# Patient Record
Sex: Female | Born: 1955 | Race: White | Hispanic: No | Smoking: Never smoker
Health system: Southern US, Community
[De-identification: ages and names within clinical notes are randomized; demographics above are authoritative.]

## PROBLEM LIST (undated history)

## (undated) DIAGNOSIS — I10 Essential (primary) hypertension: Secondary | ICD-10-CM

## (undated) DIAGNOSIS — R2689 Other abnormalities of gait and mobility: Secondary | ICD-10-CM

## (undated) DIAGNOSIS — N289 Disorder of kidney and ureter, unspecified: Secondary | ICD-10-CM

---

## 2003-12-26 ENCOUNTER — Ambulatory Visit (HOSPITAL_COMMUNITY): Admission: RE | Admit: 2003-12-26 | Discharge: 2003-12-26 | Payer: Self-pay | Admitting: Family Medicine

## 2004-10-26 ENCOUNTER — Ambulatory Visit (HOSPITAL_COMMUNITY): Admission: RE | Admit: 2004-10-26 | Discharge: 2004-10-26 | Payer: Self-pay | Admitting: Gastroenterology

## 2004-10-26 ENCOUNTER — Encounter (INDEPENDENT_AMBULATORY_CARE_PROVIDER_SITE_OTHER): Payer: Self-pay | Admitting: *Deleted

## 2006-05-13 ENCOUNTER — Emergency Department (HOSPITAL_COMMUNITY): Admission: EM | Admit: 2006-05-13 | Discharge: 2006-05-13 | Payer: Self-pay | Admitting: Emergency Medicine

## 2008-09-05 ENCOUNTER — Other Ambulatory Visit: Admission: RE | Admit: 2008-09-05 | Discharge: 2008-09-05 | Payer: Self-pay | Admitting: Family Medicine

## 2011-03-29 NOTE — Op Note (Signed)
[name], [name]              ACCOUNT NO.:  [id number]   MEDICAL RECORD NO.:  [id number]          PATIENT TYPE:  AMB   LOCATION:  ENDO                         FACILITY:  [facility]   PHYSICIAN:  James L. Rolla Flatten., M.D.DATE OF BIRTH:  [birth year]   DATE OF PROCEDURE:  DATE OF DISCHARGE:                                 OPERATIVE REPORT   DATE OF PROCEDURE:  October 26, 2004.   PROCEDURE:  Colonoscopy and biopsy.   MEDICATIONS:  1.  Fentanyl 125 mcg IV.  2.  Versed 10 mg IV.   SCOPE:  Olympus pediatric colonoscope.   INDICATIONS:  A 54 year old with chronic diarrhea and abdominal pain.  The  cause has been unclear.  She has been worked up with ultrasounds of the  abdomen, etc.  My feeling was that she likely had irritable bowel, but her  age and the fact this has been an ongoing problem for her and workup has  been negative, a colonoscopy is performed.   DESCRIPTION OF PROCEDURE:  The procedure had been explained to the patient  and consent obtained.  With the patient in the left lateral decubitus  position, the Olympus scope was inserted and advanced.  Prep was excellent.  The patient had a long, tortuous colon.  We were able to reach the cecum  with multiple maneuvers.  The ileocecal valve was seen.  The scope was  withdrawn, and the cecum, ascending colon, transverse colon, splenic  flexure, descending and sigmoid colons seen well.  The mucosa was normal.  There was a good vascular pattern with no edema, no gross colitis, and  several random biopsies were taken throughout the colon to evaluate for  microscopic colitis.  The scope was withdrawn into the rectum, and the  rectum was free of lesions.  The patient tolerated the procedure well.   ASSESSMENT:  Chronic diarrhea and abdominal pain of unclear cause.   PLAN:  Will check path and see her back in our office in three to four  weeks.       JLE/MEDQ  D:  10/26/2004  T:  10/27/2004  Job:  [id number]   cc:   Rod Preed  ????, Dr.

## 2013-05-03 ENCOUNTER — Other Ambulatory Visit (HOSPITAL_COMMUNITY)
Admission: RE | Admit: 2013-05-03 | Discharge: 2013-05-03 | Disposition: A | Discharge status: Home / Self Care | Payer: BC Managed Care – PPO | Source: Ambulatory Visit | Attending: Family Medicine | Admitting: Family Medicine

## 2013-05-03 ENCOUNTER — Other Ambulatory Visit: Payer: Self-pay | Admitting: Family Medicine

## 2013-05-03 DIAGNOSIS — Z01419 Encounter for gynecological examination (general) (routine) without abnormal findings: Secondary | ICD-10-CM | POA: Insufficient documentation

## 2015-11-28 ENCOUNTER — Other Ambulatory Visit (HOSPITAL_COMMUNITY)
Admission: RE | Admit: 2015-11-28 | Discharge: 2015-11-28 | Disposition: A | Discharge status: Home / Self Care | Payer: BLUE CROSS/BLUE SHIELD | Source: Ambulatory Visit | Attending: Family Medicine | Admitting: Family Medicine

## 2015-11-28 ENCOUNTER — Other Ambulatory Visit: Payer: Self-pay | Admitting: Family Medicine

## 2015-11-28 DIAGNOSIS — Z01411 Encounter for gynecological examination (general) (routine) with abnormal findings: Secondary | ICD-10-CM | POA: Diagnosis present

## 2015-12-01 LAB — CYTOLOGY - PAP

## 2020-04-28 ENCOUNTER — Emergency Department (HOSPITAL_COMMUNITY): Payer: Medicare Other

## 2020-04-28 ENCOUNTER — Inpatient Hospital Stay (HOSPITAL_COMMUNITY): Payer: Medicare Other

## 2020-04-28 ENCOUNTER — Encounter (HOSPITAL_COMMUNITY): Payer: Self-pay

## 2020-04-28 ENCOUNTER — Inpatient Hospital Stay (HOSPITAL_COMMUNITY)
Admission: EM | Admit: 2020-04-28 | Discharge: 2020-05-11 | DRG: 474 | Disposition: E | Payer: Medicare Other | Attending: Critical Care Medicine | Admitting: Critical Care Medicine

## 2020-04-28 ENCOUNTER — Other Ambulatory Visit: Payer: Self-pay

## 2020-04-28 DIAGNOSIS — D689 Coagulation defect, unspecified: Secondary | ICD-10-CM | POA: Diagnosis present

## 2020-04-28 DIAGNOSIS — R197 Diarrhea, unspecified: Secondary | ICD-10-CM | POA: Diagnosis not present

## 2020-04-28 DIAGNOSIS — H5461 Unqualified visual loss, right eye, normal vision left eye: Secondary | ICD-10-CM | POA: Diagnosis present

## 2020-04-28 DIAGNOSIS — D631 Anemia in chronic kidney disease: Secondary | ICD-10-CM | POA: Diagnosis present

## 2020-04-28 DIAGNOSIS — Z515 Encounter for palliative care: Secondary | ICD-10-CM | POA: Diagnosis not present

## 2020-04-28 DIAGNOSIS — R4182 Altered mental status, unspecified: Secondary | ICD-10-CM | POA: Diagnosis not present

## 2020-04-28 DIAGNOSIS — Z66 Do not resuscitate: Secondary | ICD-10-CM | POA: Diagnosis not present

## 2020-04-28 DIAGNOSIS — Y9301 Activity, walking, marching and hiking: Secondary | ICD-10-CM | POA: Diagnosis present

## 2020-04-28 DIAGNOSIS — N170 Acute kidney failure with tubular necrosis: Secondary | ICD-10-CM | POA: Diagnosis present

## 2020-04-28 DIAGNOSIS — Z79899 Other long term (current) drug therapy: Secondary | ICD-10-CM | POA: Diagnosis not present

## 2020-04-28 DIAGNOSIS — W010XXA Fall on same level from slipping, tripping and stumbling without subsequent striking against object, initial encounter: Secondary | ICD-10-CM | POA: Diagnosis present

## 2020-04-28 DIAGNOSIS — M79A21 Nontraumatic compartment syndrome of right lower extremity: Secondary | ICD-10-CM | POA: Diagnosis present

## 2020-04-28 DIAGNOSIS — J9690 Respiratory failure, unspecified, unspecified whether with hypoxia or hypercapnia: Secondary | ICD-10-CM | POA: Diagnosis present

## 2020-04-28 DIAGNOSIS — D6959 Other secondary thrombocytopenia: Secondary | ICD-10-CM | POA: Diagnosis present

## 2020-04-28 DIAGNOSIS — E872 Acidosis: Secondary | ICD-10-CM | POA: Diagnosis not present

## 2020-04-28 DIAGNOSIS — I82401 Acute embolism and thrombosis of unspecified deep veins of right lower extremity: Secondary | ICD-10-CM | POA: Diagnosis present

## 2020-04-28 DIAGNOSIS — Z4659 Encounter for fitting and adjustment of other gastrointestinal appliance and device: Secondary | ICD-10-CM

## 2020-04-28 DIAGNOSIS — R609 Edema, unspecified: Secondary | ICD-10-CM

## 2020-04-28 DIAGNOSIS — R109 Unspecified abdominal pain: Secondary | ICD-10-CM

## 2020-04-28 DIAGNOSIS — R296 Repeated falls: Secondary | ICD-10-CM | POA: Diagnosis present

## 2020-04-28 DIAGNOSIS — R238 Other skin changes: Secondary | ICD-10-CM

## 2020-04-28 DIAGNOSIS — R6521 Severe sepsis with septic shock: Secondary | ICD-10-CM | POA: Diagnosis not present

## 2020-04-28 DIAGNOSIS — I129 Hypertensive chronic kidney disease with stage 1 through stage 4 chronic kidney disease, or unspecified chronic kidney disease: Secondary | ICD-10-CM | POA: Diagnosis present

## 2020-04-28 DIAGNOSIS — R230 Cyanosis: Secondary | ICD-10-CM | POA: Diagnosis not present

## 2020-04-28 DIAGNOSIS — Z7189 Other specified counseling: Secondary | ICD-10-CM | POA: Diagnosis not present

## 2020-04-28 DIAGNOSIS — H5704 Mydriasis: Secondary | ICD-10-CM | POA: Diagnosis not present

## 2020-04-28 DIAGNOSIS — I96 Gangrene, not elsewhere classified: Secondary | ICD-10-CM | POA: Diagnosis present

## 2020-04-28 DIAGNOSIS — D62 Acute posthemorrhagic anemia: Secondary | ICD-10-CM | POA: Diagnosis not present

## 2020-04-28 DIAGNOSIS — Z20822 Contact with and (suspected) exposure to covid-19: Secondary | ICD-10-CM | POA: Diagnosis present

## 2020-04-28 DIAGNOSIS — J9601 Acute respiratory failure with hypoxia: Secondary | ICD-10-CM | POA: Diagnosis not present

## 2020-04-28 DIAGNOSIS — D72829 Elevated white blood cell count, unspecified: Secondary | ICD-10-CM | POA: Diagnosis not present

## 2020-04-28 DIAGNOSIS — E8770 Fluid overload, unspecified: Secondary | ICD-10-CM | POA: Diagnosis not present

## 2020-04-28 DIAGNOSIS — G9340 Encephalopathy, unspecified: Secondary | ICD-10-CM | POA: Diagnosis not present

## 2020-04-28 DIAGNOSIS — G9341 Metabolic encephalopathy: Secondary | ICD-10-CM | POA: Diagnosis not present

## 2020-04-28 DIAGNOSIS — I7090 Unspecified atherosclerosis: Secondary | ICD-10-CM | POA: Diagnosis not present

## 2020-04-28 DIAGNOSIS — A419 Sepsis, unspecified organism: Secondary | ICD-10-CM | POA: Diagnosis not present

## 2020-04-28 DIAGNOSIS — R0989 Other specified symptoms and signs involving the circulatory and respiratory systems: Secondary | ICD-10-CM | POA: Diagnosis not present

## 2020-04-28 DIAGNOSIS — N183 Chronic kidney disease, stage 3 unspecified: Secondary | ICD-10-CM | POA: Diagnosis not present

## 2020-04-28 DIAGNOSIS — R2681 Unsteadiness on feet: Secondary | ICD-10-CM | POA: Diagnosis present

## 2020-04-28 DIAGNOSIS — J95821 Acute postprocedural respiratory failure: Secondary | ICD-10-CM | POA: Diagnosis not present

## 2020-04-28 DIAGNOSIS — R680 Hypothermia, not associated with low environmental temperature: Secondary | ICD-10-CM | POA: Diagnosis not present

## 2020-04-28 DIAGNOSIS — I9589 Other hypotension: Secondary | ICD-10-CM | POA: Diagnosis not present

## 2020-04-28 DIAGNOSIS — Z823 Family history of stroke: Secondary | ICD-10-CM

## 2020-04-28 DIAGNOSIS — N1832 Chronic kidney disease, stage 3b: Secondary | ICD-10-CM | POA: Diagnosis present

## 2020-04-28 DIAGNOSIS — E875 Hyperkalemia: Secondary | ICD-10-CM | POA: Diagnosis not present

## 2020-04-28 DIAGNOSIS — S80821A Blister (nonthermal), right lower leg, initial encounter: Secondary | ICD-10-CM | POA: Diagnosis present

## 2020-04-28 DIAGNOSIS — G2581 Restless legs syndrome: Secondary | ICD-10-CM | POA: Diagnosis present

## 2020-04-28 DIAGNOSIS — M6282 Rhabdomyolysis: Principal | ICD-10-CM | POA: Diagnosis present

## 2020-04-28 DIAGNOSIS — Z452 Encounter for adjustment and management of vascular access device: Secondary | ICD-10-CM

## 2020-04-28 DIAGNOSIS — E860 Dehydration: Secondary | ICD-10-CM | POA: Diagnosis present

## 2020-04-28 DIAGNOSIS — T796XXS Traumatic ischemia of muscle, sequela: Secondary | ICD-10-CM | POA: Diagnosis not present

## 2020-04-28 DIAGNOSIS — S80822A Blister (nonthermal), left lower leg, initial encounter: Secondary | ICD-10-CM | POA: Diagnosis present

## 2020-04-28 DIAGNOSIS — Z978 Presence of other specified devices: Secondary | ICD-10-CM

## 2020-04-28 DIAGNOSIS — I120 Hypertensive chronic kidney disease with stage 5 chronic kidney disease or end stage renal disease: Secondary | ICD-10-CM | POA: Diagnosis not present

## 2020-04-28 DIAGNOSIS — T79A21S Traumatic compartment syndrome of right lower extremity, sequela: Secondary | ICD-10-CM | POA: Diagnosis not present

## 2020-04-28 DIAGNOSIS — Z794 Long term (current) use of insulin: Secondary | ICD-10-CM

## 2020-04-28 DIAGNOSIS — N179 Acute kidney failure, unspecified: Secondary | ICD-10-CM | POA: Diagnosis not present

## 2020-04-28 DIAGNOSIS — M79A22 Nontraumatic compartment syndrome of left lower extremity: Secondary | ICD-10-CM | POA: Diagnosis present

## 2020-04-28 DIAGNOSIS — R579 Shock, unspecified: Secondary | ICD-10-CM | POA: Diagnosis not present

## 2020-04-28 DIAGNOSIS — I4891 Unspecified atrial fibrillation: Secondary | ICD-10-CM | POA: Diagnosis not present

## 2020-04-28 DIAGNOSIS — R34 Anuria and oliguria: Secondary | ICD-10-CM | POA: Diagnosis present

## 2020-04-28 DIAGNOSIS — Z888 Allergy status to other drugs, medicaments and biological substances status: Secondary | ICD-10-CM

## 2020-04-28 DIAGNOSIS — Z992 Dependence on renal dialysis: Secondary | ICD-10-CM

## 2020-04-28 DIAGNOSIS — T79A0XA Compartment syndrome, unspecified, initial encounter: Secondary | ICD-10-CM

## 2020-04-28 DIAGNOSIS — E669 Obesity, unspecified: Secondary | ICD-10-CM | POA: Diagnosis present

## 2020-04-28 DIAGNOSIS — K7200 Acute and subacute hepatic failure without coma: Secondary | ICD-10-CM | POA: Diagnosis not present

## 2020-04-28 DIAGNOSIS — W19XXXA Unspecified fall, initial encounter: Secondary | ICD-10-CM

## 2020-04-28 DIAGNOSIS — R578 Other shock: Secondary | ICD-10-CM | POA: Diagnosis present

## 2020-04-28 DIAGNOSIS — D696 Thrombocytopenia, unspecified: Secondary | ICD-10-CM | POA: Diagnosis not present

## 2020-04-28 DIAGNOSIS — Z6837 Body mass index (BMI) 37.0-37.9, adult: Secondary | ICD-10-CM

## 2020-04-28 DIAGNOSIS — R0902 Hypoxemia: Secondary | ICD-10-CM

## 2020-04-28 DIAGNOSIS — T79A21A Traumatic compartment syndrome of right lower extremity, initial encounter: Secondary | ICD-10-CM | POA: Diagnosis present

## 2020-04-28 DIAGNOSIS — I70229 Atherosclerosis of native arteries of extremities with rest pain, unspecified extremity: Secondary | ICD-10-CM | POA: Diagnosis present

## 2020-04-28 DIAGNOSIS — Z9911 Dependence on respirator [ventilator] status: Secondary | ICD-10-CM | POA: Diagnosis not present

## 2020-04-28 DIAGNOSIS — R531 Weakness: Secondary | ICD-10-CM | POA: Diagnosis not present

## 2020-04-28 DIAGNOSIS — T796XXD Traumatic ischemia of muscle, subsequent encounter: Secondary | ICD-10-CM | POA: Diagnosis not present

## 2020-04-28 DIAGNOSIS — E785 Hyperlipidemia, unspecified: Secondary | ICD-10-CM | POA: Diagnosis present

## 2020-04-28 DIAGNOSIS — I999 Unspecified disorder of circulatory system: Secondary | ICD-10-CM

## 2020-04-28 DIAGNOSIS — R571 Hypovolemic shock: Secondary | ICD-10-CM | POA: Diagnosis not present

## 2020-04-28 DIAGNOSIS — T79A21D Traumatic compartment syndrome of right lower extremity, subsequent encounter: Secondary | ICD-10-CM | POA: Diagnosis not present

## 2020-04-28 HISTORY — DX: Other abnormalities of gait and mobility: R26.89

## 2020-04-28 HISTORY — DX: Disorder of kidney and ureter, unspecified: N28.9

## 2020-04-28 HISTORY — DX: Essential (primary) hypertension: I10

## 2020-04-28 LAB — RAPID URINE DRUG SCREEN, HOSP PERFORMED
Amphetamines: NOT DETECTED
Barbiturates: NOT DETECTED
Benzodiazepines: NOT DETECTED
Cocaine: NOT DETECTED
Opiates: NOT DETECTED
Tetrahydrocannabinol: NOT DETECTED

## 2020-04-28 LAB — I-STAT VENOUS BLOOD GAS, ED
Acid-base deficit: 5 mmol/L — ABNORMAL HIGH (ref 0.0–2.0)
Bicarbonate: 22.3 mmol/L (ref 20.0–28.0)
Calcium, Ion: 0.85 mmol/L — CL (ref 1.15–1.40)
HCT: 51 % — ABNORMAL HIGH (ref 36.0–46.0)
Hemoglobin: 17.3 g/dL — ABNORMAL HIGH (ref 12.0–15.0)
O2 Saturation: 70 %
Potassium: 6.9 mmol/L (ref 3.5–5.1)
Sodium: 138 mmol/L (ref 135–145)
TCO2: 24 mmol/L (ref 22–32)
pCO2, Ven: 47.1 mmHg (ref 44.0–60.0)
pH, Ven: 7.283 (ref 7.250–7.430)
pO2, Ven: 42 mmHg (ref 32.0–45.0)

## 2020-04-28 LAB — COMPREHENSIVE METABOLIC PANEL
ALT: 155 U/L — ABNORMAL HIGH (ref 0–44)
AST: 371 U/L — ABNORMAL HIGH (ref 15–41)
Albumin: 4.2 g/dL (ref 3.5–5.0)
Alkaline Phosphatase: 74 U/L (ref 38–126)
Anion gap: 14 (ref 5–15)
BUN: 44 mg/dL — ABNORMAL HIGH (ref 8–23)
CO2: 21 mmol/L — ABNORMAL LOW (ref 22–32)
Calcium: 8 mg/dL — ABNORMAL LOW (ref 8.9–10.3)
Chloride: 105 mmol/L (ref 98–111)
Creatinine, Ser: 1.95 mg/dL — ABNORMAL HIGH (ref 0.44–1.00)
GFR calc Af Amer: 31 mL/min — ABNORMAL LOW (ref >60)
GFR calc non Af Amer: 26 mL/min — ABNORMAL LOW (ref >60)
Glucose, Bld: 159 mg/dL — ABNORMAL HIGH (ref 70–99)
Potassium: 5.6 mmol/L — ABNORMAL HIGH (ref 3.5–5.1)
Sodium: 140 mmol/L (ref 135–145)
Total Bilirubin: 1.7 mg/dL — ABNORMAL HIGH (ref 0.3–1.2)
Total Protein: 8 g/dL (ref 6.5–8.1)

## 2020-04-28 LAB — CBC WITH DIFFERENTIAL/PLATELET
Abs Immature Granulocytes: 0 10*3/uL (ref 0.00–0.07)
Basophils Absolute: 0 10*3/uL (ref 0.0–0.1)
Basophils Relative: 0 %
Eosinophils Absolute: 0 10*3/uL (ref 0.0–0.5)
Eosinophils Relative: 0 %
HCT: 48.6 % — ABNORMAL HIGH (ref 36.0–46.0)
Hemoglobin: 15.1 g/dL — ABNORMAL HIGH (ref 12.0–15.0)
Lymphocytes Relative: 1 %
Lymphs Abs: 0.3 10*3/uL — ABNORMAL LOW (ref 0.7–4.0)
MCH: 27.4 pg (ref 26.0–34.0)
MCHC: 31.1 g/dL (ref 30.0–36.0)
MCV: 88 fL (ref 80.0–100.0)
Monocytes Absolute: 1.7 10*3/uL — ABNORMAL HIGH (ref 0.1–1.0)
Monocytes Relative: 6 %
Neutro Abs: 26.1 10*3/uL — ABNORMAL HIGH (ref 1.7–7.7)
Neutrophils Relative %: 93 %
Platelets: 352 10*3/uL (ref 150–400)
RBC: 5.52 MIL/uL — ABNORMAL HIGH (ref 3.87–5.11)
RDW: 14.2 % (ref 11.5–15.5)
WBC: 28.1 10*3/uL — ABNORMAL HIGH (ref 4.0–10.5)
nRBC: 0 % (ref 0.0–0.2)
nRBC: 0 /100 WBC

## 2020-04-28 LAB — URINALYSIS, ROUTINE W REFLEX MICROSCOPIC
Bilirubin Urine: NEGATIVE
Glucose, UA: NEGATIVE mg/dL
Ketones, ur: 5 mg/dL — AB
Leukocytes,Ua: NEGATIVE
Nitrite: NEGATIVE
Protein, ur: 100 mg/dL — AB
Specific Gravity, Urine: 1.02 (ref 1.005–1.030)
pH: 5 (ref 5.0–8.0)

## 2020-04-28 LAB — C-REACTIVE PROTEIN: CRP: 7.8 mg/dL — ABNORMAL HIGH (ref <1.0)

## 2020-04-28 LAB — BASIC METABOLIC PANEL
Anion gap: 14 (ref 5–15)
BUN: 52 mg/dL — ABNORMAL HIGH (ref 8–23)
CO2: 18 mmol/L — ABNORMAL LOW (ref 22–32)
Calcium: 6.8 mg/dL — ABNORMAL LOW (ref 8.9–10.3)
Chloride: 107 mmol/L (ref 98–111)
Creatinine, Ser: 2.44 mg/dL — ABNORMAL HIGH (ref 0.44–1.00)
GFR calc Af Amer: 23 mL/min — ABNORMAL LOW (ref >60)
GFR calc non Af Amer: 20 mL/min — ABNORMAL LOW (ref >60)
Glucose, Bld: 149 mg/dL — ABNORMAL HIGH (ref 70–99)
Potassium: 7.2 mmol/L (ref 3.5–5.1)
Sodium: 139 mmol/L (ref 135–145)

## 2020-04-28 LAB — SEDIMENTATION RATE: Sed Rate: 15 mm/hr (ref 0–22)

## 2020-04-28 LAB — CK
Total CK: 49279 U/L — ABNORMAL HIGH (ref 38–234)
Total CK: >50000 U/L — ABNORMAL HIGH (ref 38–234)

## 2020-04-28 LAB — LACTIC ACID, PLASMA
Lactic Acid, Venous: 2.1 mmol/L (ref 0.5–1.9)
Lactic Acid, Venous: 3.1 mmol/L (ref 0.5–1.9)

## 2020-04-28 LAB — SARS CORONAVIRUS 2 BY RT PCR (HOSPITAL ORDER, PERFORMED IN ~~LOC~~ HOSPITAL LAB): SARS Coronavirus 2: NEGATIVE

## 2020-04-28 LAB — BRAIN NATRIURETIC PEPTIDE: B Natriuretic Peptide: 80.7 pg/mL (ref 0.0–100.0)

## 2020-04-28 LAB — CBG MONITORING, ED: Glucose-Capillary: 161 mg/dL — ABNORMAL HIGH (ref 70–99)

## 2020-04-28 LAB — LIPASE, BLOOD: Lipase: 29 U/L (ref 11–51)

## 2020-04-28 LAB — AMMONIA: Ammonia: 22 umol/L (ref 9–35)

## 2020-04-28 LAB — ETHANOL: Alcohol, Ethyl (B): <10 mg/dL (ref <10)

## 2020-04-28 MED ORDER — INSULIN ASPART 100 UNIT/ML ~~LOC~~ SOLN
10.0000 [IU] | Freq: Once | SUBCUTANEOUS | Status: AC
  Administered 2020-04-28: 10 [IU] via INTRAVENOUS

## 2020-04-28 MED ORDER — ALBUTEROL SULFATE (2.5 MG/3ML) 0.083% IN NEBU
10.0000 mg | INHALATION_SOLUTION | Freq: Once | RESPIRATORY_TRACT | Status: AC
  Administered 2020-04-28: 10 mg via RESPIRATORY_TRACT
  Filled 2020-04-28: qty 12

## 2020-04-28 MED ORDER — SODIUM CHLORIDE 0.9 % IV SOLN: Freq: Once | INTRAVENOUS | Status: DC

## 2020-04-28 MED ORDER — ACETAMINOPHEN 325 MG PO TABS: 650.0000 mg | ORAL_TABLET | Freq: Four times a day (QID) | ORAL | Status: DC | PRN

## 2020-04-28 MED ORDER — LACTATED RINGERS IV SOLN: INTRAVENOUS | Status: AC

## 2020-04-28 MED ORDER — ENOXAPARIN SODIUM 30 MG/0.3ML ~~LOC~~ SOLN
30.0000 mg | SUBCUTANEOUS | Status: DC
  Administered 2020-04-28 – 2020-04-29 (×2): 30 mg via SUBCUTANEOUS
  Filled 2020-04-28 (×2): qty 0.3

## 2020-04-28 MED ORDER — INSULIN ASPART 100 UNIT/ML ~~LOC~~ SOLN: 10.0000 [IU] | Freq: Once | SUBCUTANEOUS | Status: DC

## 2020-04-28 MED ORDER — INSULIN ASPART 100 UNIT/ML ~~LOC~~ SOLN
0.0000 [IU] | Freq: Three times a day (TID) | SUBCUTANEOUS | Status: DC
  Administered 2020-04-29 (×2): 2 [IU] via SUBCUTANEOUS

## 2020-04-28 MED ORDER — DEXTROSE 50 % IV SOLN
50.0000 mL | Freq: Once | INTRAVENOUS | Status: AC
  Administered 2020-04-28: 50 mL via INTRAVENOUS
  Filled 2020-04-28: qty 50

## 2020-04-28 MED ORDER — SODIUM ZIRCONIUM CYCLOSILICATE 5 G PO PACK
5.0000 g | PACK | Freq: Two times a day (BID) | ORAL | Status: DC
  Administered 2020-04-28 – 2020-04-29 (×2): 5 g via ORAL
  Filled 2020-04-28 (×4): qty 1

## 2020-04-28 MED ORDER — SENNOSIDES-DOCUSATE SODIUM 8.6-50 MG PO TABS: 1.0000 | ORAL_TABLET | Freq: Every evening | ORAL | Status: DC | PRN

## 2020-04-28 MED ORDER — SODIUM CHLORIDE 0.9 % IV BOLUS
500.0000 mL | Freq: Once | INTRAVENOUS | Status: AC
  Administered 2020-04-28: 500 mL via INTRAVENOUS

## 2020-04-28 MED ORDER — CALCIUM GLUCONATE-NACL 1-0.675 GM/50ML-% IV SOLN
1.0000 g | Freq: Once | INTRAVENOUS | Status: AC
  Administered 2020-04-28: 1000 mg via INTRAVENOUS
  Filled 2020-04-28: qty 50

## 2020-04-28 MED ORDER — ASPIRIN 325 MG PO TABS
325.0000 mg | ORAL_TABLET | Freq: Every day | ORAL | Status: DC
  Administered 2020-04-28: 325 mg via ORAL
  Filled 2020-04-28: qty 1

## 2020-04-28 MED ORDER — SODIUM CHLORIDE 0.9 % IV BOLUS
1000.0000 mL | Freq: Once | INTRAVENOUS | Status: AC
  Administered 2020-04-28: 1000 mL via INTRAVENOUS

## 2020-04-28 MED ORDER — LACTATED RINGERS IV BOLUS
1000.0000 mL | Freq: Once | INTRAVENOUS | Status: AC
  Administered 2020-04-28: 1000 mL via INTRAVENOUS

## 2020-04-28 MED ORDER — ACETAMINOPHEN 650 MG RE SUPP: 650.0000 mg | Freq: Four times a day (QID) | RECTAL | Status: DC | PRN

## 2020-04-28 NOTE — ED Notes (Signed)
Pt continues to sleep, intermittently wakes with speech and touch.

## 2020-04-28 NOTE — ED Notes (Signed)
Pt returns from xray with reports of difficulty getting pt in good positions for films.  PA notified.

## 2020-04-28 NOTE — H&P (Signed)
Date: 04/27/2020               Patient Name:  [name] MRN: [id number]  DOB: 27-Oct-1956 Age / Sex: 64 y.o., female   PCP: Patient, No Pcp Per         Medical Service: Internal Medicine Teaching Service         Attending Physician: Dr. Charlesetta Shanks, MD    First Contact: Dr. Harvie Heck Pager: [phone]  Second Contact: Dr. Gilberto Better Pager: [phone]       After Hours (After 5p/  First Contact Pager: [phone]  weekends / holidays): Second Contact Pager: [phone]   Chief Complaint: Fall  History of Present Illness:   [name] is a 64 yo F w/ PMH of HTN, Restless leg presenting to [facility] w/ complaint of fall. She was in her usual state of health until last night when she fell out of her bed while sleeping. She states she was woken up by the fall and called out to her family members who were able to get her into her bed. Later in the night, she fell again but this time her family members were not able to get her off the floor for ~3-4 hours. She denies any trauma to the head. She is unable to recall the exact nature of her fall as she was sleeping at the time. Eventually they found her down and brought her to the hospital. She mentions that after prolonged stay on the floor, she began to have worsening pain 'all over' but especially of her lower extremities, which is exacerbated by ambulation. She denies any headache, blurry vision, nausea, vomiting. She mentions feeling very cold and weak.  Attempt was made to contact family for further history but phone number did not pick up.  In the ED, she was found to have CK of 50,000, with AKI and hyperkalemia. IMTS was consulted for admission.   Meds: Current Meds  Medication Sig  . amLODipine (NORVASC) 10 MG tablet Take 10 mg by mouth daily.  Marland Kitchen gabapentin (NEURONTIN) 100 MG capsule Take 100-200 mg by mouth See admin instructions. Take 200 mg by mouth midday and 100 mg two hours before bedtime   Allergies: Allergies as of  04/16/2020 - Review Complete 05/10/2020  Allergen Reaction Noted  . Olmesartan Other (See Comments) 11/12/2018  . Requip [ropinirole] Other (See Comments) 04/16/2020  . Valsartan Other (See Comments) 11/12/2018   Past Medical History:  Diagnosis Date  . Diabetes mellitus without complication (HCC)    Borderline  . Renal disorder    Chronic kidney disease   Family History:  Mother had stroke, Father had CABG in 31s. Brother has a pacemaker  Social History: Denies any alcohol, tobacco, illicit substance use. Able to perform ADLs, and IADLs independently at baseline. Lives with daughter and her family.  Review of Systems: A complete ROS was negative except as per HPI.  Physical Exam: Blood pressure 134/90, pulse 83, temperature 98.5 F (36.9 C), temperature source Oral, resp. rate (!) 28, height 5' 2"  (1.575 m), weight 81.6 kg, SpO2 100 %.  Gen: Well-developed, ill-appearing HEENT: NCAT head, hearing intact, Right artificial eye, No nasal discharge CV: RRR, S1, S2 normal, No rubs, no murmurs Pulm: CTAB, No rales, no wheezes Abd: Soft, BS+, NTND, No rebound, no guarding  Extm: ROM limited by pain, Peripheral pulses intact, no pitting edema Skin: Dry, Warm, multiple Clear filled blisters bilateral lower extremities Neurologic exam: Mental status: A&Ox3 Cranial Nerves:  II: PERRL             III, IV, VI: Extra-occular motions intact on left             V, VII: Face symmetric, sensation intact in all 3 divisions               VIII: hearing normal to rubbing fingers bilaterally               IX, X: palate rises symmetrically             XI: Head turn and shoulder shrug normal bilaterally               XII: tongue midline    Motor: Strength 5/5 on all bilateral upper extremities, Strength 0/5 on bilateral lower extremities, bulk muscle and tone are normal  Sensory: Light touch intact and symmetric diminished bilateral lower extremities Coordination: Unable to  assess Psychiatric: Normal mood and affect      EKG: N/A  CXR: personally reviewed my interpretation is rotated film, poor inspiratory effort, no lobar consolidation, no pleural effusion, no pulmonary edema  Assessment & Plan by Problem: Active Problems:   * No active hospital problems. *  [name] is a 64 yo F w/ PMH of Obesity, HTN, HLD, Pre-diabetes, CKD3 and Restless legs admit for rhabdomyolysis  Rhabdomyolysis Hyperkalemia Acute Kidney Injury on Chronic Kidney Disease Stage 3a Found down after 3-4 hours. Admit CK 49,279. K 5.6, BUN 44, Creatinine 1.95. Baseline bun / creatinine (34, 1.35) w/ GFR 42. UA + for hgb w/o RBCs. Started on fluids in ED. I-stat K (6.9) elevated but likely hemolyzed. - LR bolus + 200cc/hr  - Trend CK, Bmp - Stat EKG - Monitor electrolytes - Lokelma - Telemetry  Lower extremity weakness 2/2 CVA vs deconditioning vs myolysis On exam, profound lower extremity weakness with inability to tolerate movement. Unclear if purely due to myolysis vs neurological event. CT head negative. ESR 15, CRP 7.8. Chart review shows prior work-up w/ outpatient neuro w/ negative MR brain, thought to be due to vertigo treated w/ PT. Profound weakness with numbness concerning for CVA as she has risk factors. 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 States no prior hx. Had prolonged time down after fall. Unclear etiology but visual exam consistent w/ bullous pemphigoid - Monitor - F/u outpatient  HTN On amlodipine 151mat home. Admit bp 134/90 - C/w home meds  DVT prophx: Lovenox Diet: NPO Bowel: Senokot Code: Full  Prior to Admission Living Arrangement: Home Anticipated Discharge Location: SNF Barriers to Discharge: Medical treatment Dispo: Anticipated discharge in approximately 3-4 day(s).   Dispo: Admit patient  to Inpatient with expected length of stay greater than 2 midnights.  Signed: LeMosetta AnisMD 05/08/2020, 7:01 PM Pager: [phone] 5pm on weekdays and 1pm on weekends: On Call Pager: [phone]

## 2020-04-28 NOTE — ED Notes (Signed)
Pt has confusion regarding events, when logrolled c/o pain all over.  Unable to move her legs (I) to position pure wick.  Tells PA that she had fall 2 nights ago.  She is unsure why she came to ED today, perhaps that she wasn't getting any better.   To Xray.

## 2020-04-28 NOTE — ED Notes (Signed)
Remains alert and oriented x 4, denies any pain in head, neck or chest.  No visual changes.

## 2020-04-28 NOTE — ED Notes (Signed)
Unsuccessful sticks at the right ac and hand. Only stuck patient twice.

## 2020-04-28 NOTE — ED Notes (Signed)
Pure wick in place, yet remains unable to give sample at this time.  No other needs noted.

## 2020-04-28 NOTE — ED Provider Notes (Signed)
Medical screening examination/treatment/procedure(s) were conducted as a shared visit with non-physician practitioner(s) and myself.  I personally evaluated the patient during the encounter.    Vent. rate 88 BPM PR interval * ms QRS duration 98 ms QT/QTc 375/454 ms P-R-T axes * 15 35 No acute ischemic change.  Artifact.  No atrial fibrillation.  Patient gives history.  She reports she fell out of bed 2 nights ago and woke up on the floor.  She was helped up but then the following morning lost her balance and fell again.  She reports at that time she spent about 2 to 3 hours until her daughter and grandson came back and found her.  She reports she is having a lot of general pain and pain in her legs.  She is also developed large blisters on her thighs bilaterally.  Patient is alert and answering questions.  Content of speech is appropriate.  She appears mildly delayed in responses but cognitive function is intact.  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  Mucous membranes slightly dry.  Heart regular.  No respiratory distress.  Lungs grossly clear.  Abdomen soft without guarding.  Patient has significant edema bilateral lower extremities 2+.  Large bullae of the upper legs bilaterally, the medial aspect.  No surrounding erythema.  Patient presents with falls and confusion noted by family.  She also has large atypical bullae that do not seem to correspond with pressure injury.  Extreme elevation in total CK.  CT head is not showing acute injury pattern.  Will initiate fluid resuscitation, I agree with plan and management.    Charlesetta Shanks, MD 04/17/2020 [phone]

## 2020-04-28 NOTE — ED Notes (Signed)
Dia Crawford (Daughter# [phone]) called.

## 2020-04-28 NOTE — ED Notes (Signed)
Pt cuff adjusted, and pt returns to sleep quickly.

## 2020-04-28 NOTE — ED Notes (Signed)
Patient transported to MRI 

## 2020-04-28 NOTE — ED Provider Notes (Signed)
[facility] EMERGENCY DEPARTMENT Provider Note   CSN: [id number] Arrival date & time: 04/18/2020  1517     History No chief complaint on file.   [name] is a 64 y.o. female with history of renal insufficiency, RLS, unsteady gait brought to the ER from home by EMS for evaluation of possible falls. History initially obtained directly from patient. She is able to tell me her full name, where she is. States that 2 nights ago she was asleep and rolled off her bed, woke up by the impact on the ground. Tells me she was helped up by her daughter and son-in-law. The following morning she was walking and she lost her balance and fell onto her side against a TV. States when she fell down to the ground her legs were twisted in an awkward position. She thinks she was on the ground in that position for 2 to 3 h until her daughter and son-in-law picked her back up. Woke up this morning and she noticed bilateral leg pain that is worse with any movement. She thinks it is because of the position she was in after her second fall. States she can usually walk without any assistance or assistive devices but cannot actually stand still because she starts feeling like she is swaying backwards and falls. States her primary care doctor has been evaluating her for this and are currently trying to figure out why she has a difficult time standing still. Patient complaining of pain "all over". Reports chronic left shoulder injury and pain. As were rolling patient she screams out in pain stating that everything hurts. During exam I noticed several blisters on bilateral legs, abrasions. When asked about these blisters patient states that she thinks they're from her falls and her legs being stuck in 1 position for long time. She denies headache. She denies vision changes. Denies any chest pain, shortness of breath. Denies any urinary symptoms. No recent vomiting or diarrhea. No cough. States yesterday she felt  okay and went grocery shopping for her mother. Patient seems to be slow to respond and will need to obtain further information from family member.  No interventions. No modifying factors. No associated symptoms.  HPI     Past Medical History:  Diagnosis Date  . Diabetes mellitus without complication (HCC)    Borderline  . Renal disorder    Chronic kidney disease    There are no problems to display for this patient.   ** The histories are not reviewed yet. Please review them in the "History" navigator section and refresh this [place].   OB History   No obstetric history on file.     No family history on file.  Social History   Tobacco Use  . Smoking status: Never Smoker  . Smokeless tobacco: Never Used  Substance Use Topics  . Alcohol use: Not Currently  . Drug use: Never    Home Medications Prior to Admission medications   Medication Sig Start Date End Date Taking? Authorizing Provider  amLODipine (NORVASC) 10 MG tablet Take 10 mg by mouth daily. 04/01/20  Yes [provider]  gabapentin (NEURONTIN) 100 MG capsule Take 100-200 mg by mouth See admin instructions. Take 200 mg by mouth midday and 100 mg two hours before bedtime 04/24/20  Yes [provider]  rOPINIRole (REQUIP) 2 MG tablet Take by mouth. Patient not taking: Reported on 04/24/2020 03/29/20   [provider]    Allergies    Olmesartan, Requip [ropinirole],  and Valsartan  Review of Systems   Review of Systems  Musculoskeletal: Positive for myalgias (bilateral legs).  Skin:       Blisters   All other systems reviewed and are negative.   Physical Exam Updated Vital Signs BP 134/90   Pulse 83   Temp 98.5 F (36.9 C) (Oral)   Resp (!) 28   Ht 5\' 2"  (1.575 m)   Wt 81.6 kg   SpO2 100%   BMI 32.92 kg/m   Physical Exam Vitals and nursing note reviewed.  Constitutional:      Appearance: She is well-developed.     Comments: Appears older than stated age.   HENT:      Head: Normocephalic and atraumatic.     Comments: No obvious signs of facial or scalp injury.    Nose: Nose normal.     Mouth/Throat:     Comments: Dry lips. Normal mucous membranes, moist. Eyes:     Conjunctiva/sclera: Conjunctivae normal.     Comments: Right eyelid/eyelashes have dried crusting discharge. Patient states this is not new and has chronic right eye blindness. Left eyelid, eye exam normal.  Neck:     Comments: No cervical collar in place. No midline or paraspinal cervical tenderness. Cardiovascular:     Rate and Rhythm: Normal rate and regular rhythm.     Comments: Symmetric 2+ edema from knees to midfoot bilaterally. No calf tenderness. Several skin colored blisters throughout legs bilaterally  Pulmonary:     Effort: Pulmonary effort is normal.     Breath sounds: Normal breath sounds.  Abdominal:     General: Bowel sounds are normal.     Palpations: Abdomen is soft.     Tenderness: There is no abdominal tenderness.     Comments: ?obese vs distended. No abdominal tenderness.  No RUQ/epigastric tenderness   Musculoskeletal:        General: Normal range of motion.     Cervical back: Normal range of motion.     Comments: TL spine: No midline and paraspinal muscle tenderness. No contusions or signs of injury to the back.  Pelvis: Patient holds the left leg/hip flexed. Pain in left groin/upper thigh with active ROM of left leg. Pain in right upper thigh with active ROM of right leg. Abrasion of the left lateral hip and upper thigh, mildly tender.   LUE: no focal bony tenderness of left shoulder, elbow, wrist but patient reports pain with movement on arm in the humerus.   Compartments of upper/lower legs bilaterally tender, slightly firm.   Skin:    General: Skin is warm and dry.     Capillary Refill: Capillary refill takes less than 2 seconds.     Findings: Erythema and lesion present.     Comments: Several small to large skin colored blisters throughout medial thighs  bilaterally, anterior legs. Some have ruptured spontaneously. Several linear abrasions in the left lateral thigh, left lateral abdomen. Nontender erythematous glossy patch posterior knee (left).  Neurological:     Mental Status: She is alert.     Sensory: Sensory deficit present.     Comments:  Patient cannot feel light touch on RIGHT foot, normal sensation in the rest of the leg.  This is inconsistent in serial exams, previously, patient could not feel from knee > foot.  Patient cannot move RLE at hip, knee or ankle level or wiggles toes. Reports pain in right upper thigh with passive ROM of RLE. Weak but symmetric bilateral hand grips.  No  arm drift, patient cannot hold palms up for pronator drift due to bilateral weakness. Answers are slightly delayed but speech is clear. Patient appears somnolent but arouses to voice and can hold coherent conversation.  Cranial nerves I, II not tested. Cranial nerves III to XII normal. Unable to test gait.  Psychiatric:        Behavior: Behavior normal.     ED Results / Procedures / Treatments   Labs (all labs ordered are listed, but only abnormal results are displayed) Labs Reviewed  CBC WITH DIFFERENTIAL/PLATELET - Abnormal; Notable for the following components:      Result Value   WBC 28.1 (*)    RBC 5.52 (*)    Hemoglobin 15.1 (*)    HCT 48.6 (*)    Neutro Abs 26.1 (*)    Lymphs Abs 0.3 (*)    Monocytes Absolute 1.7 (*)    All other components within normal limits  COMPREHENSIVE METABOLIC PANEL - Abnormal; Notable for the following components:   Potassium 5.6 (*)    CO2 21 (*)    Glucose, Bld 159 (*)    BUN 44 (*)    Creatinine, Ser 1.95 (*)    Calcium 8.0 (*)    AST 371 (*)    ALT 155 (*)    Total Bilirubin 1.7 (*)    GFR calc non Af Amer 26 (*)    GFR calc Af Amer 31 (*)    All other components within normal limits  URINALYSIS, ROUTINE W REFLEX MICROSCOPIC - Abnormal; Notable for the following components:   APPearance HAZY (*)     Hgb urine dipstick LARGE (*)    Ketones, ur 5 (*)    Protein, ur 100 (*)    Bacteria, UA RARE (*)    All other components within normal limits  CK - Abnormal; Notable for the following components:   Total CK 49,279 (*)    All other components within normal limits  LACTIC ACID, PLASMA - Abnormal; Notable for the following components:   Lactic Acid, Venous 2.1 (*)    All other components within normal limits  I-STAT VENOUS BLOOD GAS, ED - Abnormal; Notable for the following components:   Acid-base deficit 5.0 (*)    Potassium 6.9 (*)    Calcium, Ion 0.85 (*)    HCT 51.0 (*)    Hemoglobin 17.3 (*)    All other components within normal limits  CULTURE, BLOOD (ROUTINE X 2)  CULTURE, BLOOD (ROUTINE X 2)  LIPASE, BLOOD  ETHANOL  AMMONIA  RAPID URINE DRUG SCREEN, HOSP PERFORMED  LACTIC ACID, PLASMA  SEDIMENTATION RATE  C-REACTIVE PROTEIN  BRAIN NATRIURETIC PEPTIDE    EKG None  Radiology DG Chest 1 View  Result Date: 04/20/2020 CLINICAL DATA:  Recent fall with chest pain, initial encounter EXAM: CHEST  1 VIEW COMPARISON:  None. FINDINGS: Cardiac shadow is at the upper limits of normal in size. The overall inspiratory effort is poor. No infiltrate or effusion is seen. No pneumothorax is noted. The visualized bony structures are unremarkable. IMPRESSION: No acute abnormality noted. Electronically Signed   By: Inez Catalina M.D.   On: 04/20/2020 12:34   DG Lumbar Spine Complete  Result Date: 04/27/2020 CLINICAL DATA:  Fall with pain EXAM: LUMBAR SPINE - COMPLETE 4+ VIEW COMPARISON:  None. FINDINGS: Lumbar alignment is within normal limits. Vertebral body heights are maintained. Diffuse degenerative changes with diffuse mild disc space narrowing throughout the lumbar spine and small osteophytes. More bulky anterior osteophyte  at L1-L2. Mild facet degenerative change of the lower lumbar spine. IMPRESSION: No acute osseous abnormality. Electronically Signed   By: Donavan Foil M.D.   On:  04/29/2020 17:57   DG Shoulder Right  Result Date: 05/07/2020 CLINICAL DATA:  Recent fall with right shoulder pain, initial encounter EXAM: RIGHT SHOULDER - 2+ VIEW COMPARISON:  None. FINDINGS: Mild degenerative changes of the acromioclavicular joint are seen. No acute fracture or dislocation is noted. The underlying bony thorax is within normal limits. IMPRESSION: Degenerative change without acute abnormality. Electronically Signed   By: Inez Catalina M.D.   On: 05/03/2020 12:33   DG Elbow Complete Left  Result Date: 05/05/2020 CLINICAL DATA:  Fall with upper extremity pain EXAM: LEFT ELBOW - COMPLETE 3+ VIEW COMPARISON:  None. FINDINGS: There is no evidence of fracture, dislocation, or joint effusion. There is no evidence of arthropathy or other focal bone abnormality. Soft tissues are unremarkable. IMPRESSION: Negative. Electronically Signed   By: Donavan Foil M.D.   On: 05/10/2020 17:55   CT Head Wo Contrast  Result Date: 04/20/2020 CLINICAL DATA:  Fall.  Patient denies head and neck pain. EXAM: CT HEAD WITHOUT CONTRAST CT CERVICAL SPINE WITHOUT CONTRAST TECHNIQUE: Multidetector CT imaging of the head and cervical spine was performed following the standard protocol without intravenous contrast. Multiplanar CT image reconstructions of the cervical spine were also generated. COMPARISON:  None. FINDINGS: CT HEAD FINDINGS Brain: No subdural, epidural, or subarachnoid hemorrhage. Ventricles and sulci are unremarkable. Cerebellum, brainstem, and basal cisterns are patent. No acute cortical ischemia or infarct. No mass effect or midline shift. Vascular: No hyperdense vessel or unexpected calcification. Skull: Normal. Negative for fracture or focal lesion. Sinuses/Orbits: No acute finding. Other: Left globe is intact. 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 Recommend clinical correlation. No other extracranial soft tissue abnormalities. CT CERVICAL SPINE FINDINGS Alignment: Reversal of normal  lordosis centered at C5-6. No traumatic malalignment identified. Skull base and vertebrae: No acute fracture. No primary bone lesion or focal pathologic process. Soft tissues and spinal canal: Apparent 1.6 cm nodule in the posterior right thyroid lobe on series 1, image 59. Disc levels:  Multilevel degenerative changes. Upper chest: Negative. Other: No other abnormalities. IMPRESSION: 1. No acute intracranial abnormalities identified. 2. 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 Recommend clinical correlation. 3. No fracture or traumatic malalignment in the cervical spine. 4. 1.6 cm nodule in the right thyroid lobe. Recommend thyroid US (ref: J Am Coll Radiol. 2015 Feb;12(2): 143-50). Electronically Signed   By: Dorise Bullion III M.D   On: 05/08/2020 14:24   CT Cervical Spine Wo Contrast  Result Date: 04/18/2020 CLINICAL DATA:  Fall.  Patient denies head and neck pain. EXAM: CT HEAD WITHOUT CONTRAST CT CERVICAL SPINE WITHOUT CONTRAST TECHNIQUE: Multidetector CT imaging of the head and cervical spine was performed following the standard protocol without intravenous contrast. Multiplanar CT image reconstructions of the cervical spine were also generated. COMPARISON:  None. FINDINGS: CT HEAD FINDINGS Brain: No subdural, epidural, or subarachnoid hemorrhage. Ventricles and sulci are unremarkable. Cerebellum, brainstem, and basal cisterns are patent. No acute cortical ischemia or infarct. No mass effect or midline shift. Vascular: No hyperdense vessel or unexpected calcification. Skull: Normal. Negative for fracture or focal lesion. Sinuses/Orbits: No acute finding. Other: Left globe is intact. 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 Recommend clinical correlation. No other extracranial soft tissue abnormalities. CT CERVICAL SPINE FINDINGS Alignment: Reversal of normal lordosis centered at C5-6.  No traumatic malalignment identified. Skull base and vertebrae: No acute fracture. No primary  bone lesion or focal pathologic process. Soft tissues and spinal canal: Apparent 1.6 cm nodule in the posterior right thyroid lobe on series 1, image 59. Disc levels:  Multilevel degenerative changes. Upper chest: Negative. Other: No other abnormalities. IMPRESSION: 1. No acute intracranial abnormalities identified. 2. 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 Recommend clinical correlation. 3. No fracture or traumatic malalignment in the cervical spine. 4. 1.6 cm nodule in the right thyroid lobe. Recommend thyroid US (ref: J Am Coll Radiol. 2015 Feb;12(2): 143-50). Electronically Signed   By: Dorise Bullion III M.D   On: 04/15/2020 14:24   DG Shoulder Left  Result Date: 04/29/2020 CLINICAL DATA:  Recent fall with left shoulder pain, initial encounter EXAM: LEFT SHOULDER - 2+ VIEW COMPARISON:  None. FINDINGS: Degenerative changes of the acromioclavicular joint are seen. No acute fracture or dislocation is noted. The underlying bony thorax is within normal limits. No soft tissue abnormality is seen. IMPRESSION: No acute abnormality noted. Electronically Signed   By: Inez Catalina M.D.   On: 04/16/2020 12:29   DG Humerus Left  Result Date: 04/22/2020 CLINICAL DATA:  Altered left upper extremity pain fall EXAM: LEFT HUMERUS - 2+ VIEW COMPARISON:  04/29/2020 radiograph left shoulder FINDINGS: There is no evidence of fracture or other focal bone lesions. Soft tissues are unremarkable. IMPRESSION: Negative. Electronically Signed   By: Donavan Foil M.D.   On: 04/24/2020 17:55   DG Hip Unilat W or Wo Pelvis 2-3 Views Left  Result Date: 05/05/2020 CLINICAL DATA:  Recent fall with left hip pain, initial encounter EXAM: DG HIP (WITH OR WITHOUT PELVIS) 3V LEFT COMPARISON:  None. FINDINGS: Pelvic ring is intact. Mild degenerative changes of the hip joints are seen. No acute fracture is noted. No soft tissue abnormality is seen. IMPRESSION: No acute abnormality noted. Electronically Signed   By: Inez Catalina M.D.   On: 05/02/2020 12:34   DG FEMUR, MIN 2 VIEWS RIGHT  Result Date: 04/18/2020 CLINICAL DATA:  Right femur pain after fall EXAM: RIGHT FEMUR 2 VIEWS COMPARISON:  04/20/2020 hip radiograph FINDINGS: There is no evidence of fracture or other focal bone lesions. Soft tissues are unremarkable. IMPRESSION: Negative. Electronically Signed   By: Donavan Foil M.D.   On: 05/03/2020 17:58    Procedures .Critical Care Performed by: Kinnie Feil, PA-C Authorized by: Kinnie Feil, PA-C   Critical care provider statement:    Critical care time (minutes):  45   Critical care was necessary to treat or prevent imminent or life-threatening deterioration of the following conditions: rhabdo.   Critical care was time spent personally by me on the following activities:  Discussions with consultants, evaluation of patient's response to treatment, examination of patient, ordering and performing treatments and interventions, ordering and review of laboratory studies, ordering and review of radiographic studies, pulse oximetry, re-evaluation of patient's condition, obtaining history from patient or surrogate and review of old charts   (including critical care time)  Medications Ordered in ED Medications  0.9 %  sodium chloride infusion (has no administration in time range)  sodium chloride 0.9 % bolus 500 mL (500 mLs Intravenous New Bag/Given 05/09/2020 1741)    ED Course  I have reviewed the triage vital signs and the nursing notes.  Pertinent labs & imaging results that were available during my care of the patient were reviewed by me and considered in my medical decision  making (see chart for details).  Clinical Course as of Apr 28 1820  Fri Apr 28, 2020  1333 Potassium(!): 5.6 [CG]  1333 Creatinine(!): 1.95 [CG]  1333 BUN(!): 44 [CG]  1333 AST(!): 371 [CG]  1333 ALT(!): 155 [CG]  1333 Total Bilirubin(!): 1.7 [CG]  1333 GFR, Est Non African American(!): 26 [CG]  1345 Daughter -  Went to see a new doctor ENT did testing for vertigo, unsure if that messed her balance.  States since appointment her legs have been hurting more and walking is worse. Gabapentin and another med for RLS, stopped another medicine cold [place].  Has not slept well because legs have been hurting since.  Fall last night, seemed ok.  Grandson went to check on her and on floor again. Daughter today called EMS because she seem incoherent, sleepier.  ?hallucinations "people coming in all night".  Noticed leg swelling and blisters.  Concern for dehydration, something related to the fall.  She typically does walk by herself, indepednet, feeds bathes herself.     [CG]  1350 Daughter states she has fallen before but not often. Concern about mother able to walk.   [CG]  1456 1. No acute intracranial abnormalities identified. 2. 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 Recommend clinical correlation. 3. No fracture or traumatic malalignment in the cervical spine. 4. 1.6 cm nodule in the right thyroid lobe. Recommend thyroid US (ref: J Am Coll Radiol. 2015 Feb;12(2): 143-50).  CT Head Wo Contrast [CG]  1617 Significant delay in CK - called lab    [CG]  1618 Lab states 13 dilutions needed .Marland Kitchen.. 49,279   [CG]  1632 CK Total(!): 49,279 [CG]  1062 Pharmacy asked to reconcile meds   [CG]  1804 Lactic Acid, Venous(!!): 2.1 [CG]    Clinical Course User Index [CG] Kinnie Feil, PA-C   MDM Rules/Calculators/A&P                          Patient with chief complaint of falls.  I obtained additional history from patient's daughter who lives with her, see above.  Concern for AMS, confusion, hallucinations, inability to walk. Usually ambulatory, independent.   Previous medical records available, nursing notes reviewed to obtain more history as well and assist with MDM.  Several positive findings on exam.  Patient appears dehydrated, dry lips.  She has generalized weakness in all extremities,  tremorous in the upper extremities.  She cannot move her legs, can only wiggle her left toes.  She cannot feel her right foot but can otherwise feels the upper part of the right leg.  Movement of left upper, bilateral legs causes pain everywhere.  Appears somewhat tired but is giving coherent history.  Chief complain involves an extensive number of treatment options and is a complaint that carries with it a high risk of complications and morbidity and mortality  The differential diagnosis includes occult infection, electrolyte abnormalities, intracranial bleed.  She has no history of illicit drug use, alcohol use.  Overall physical exam is not consistent with stroke however neurological exam is unreliable given her mentation.  I have ordered laboratory studies including screening labs, CK, ammonia, lactic acid, VBG, urinalysis.  I have ordered imaging studies including head/cervical CT, x-rays based on exam findings.  Continuous cardiac monitoring, continuous pulse ox.  1648: Significant delay in obtaining lab work. Significant delay in obtaining I&O for urine.  I ordered, reviewed and personally visualized and interpreted the above  ER work-up.  ER work-up reveals leukocytosis 28, markedly elevated CK almost 50,000, elevated LFTs, creatinine 1.95.  Urine is coke colored.  I do not have other labs to determine patient's baseline but and she has documented history of CKD.  X-rays and head/cervical CT are nonacute.    Pending lactic acid, ammonia, BNP, UA.  Concern for possible myositis, CRP and sed rate added.  X-rays pending.   Patient shared with EDP.  Will plan to admit for rhabdomyolysis.  500 IV fluid bolus ordered followed by maintenance fluids.  1558: Ammonia normal.  Lactic acid 2.1.  Pending VBG.   I have re-evaluated the patient and no clinical decline, tired appearing but wakes up with conversation, no increased work of breathing.   Pending hospital admission. RN to recollect/resend  VBG.   Final Clinical Impression(s) / ED Diagnoses Final diagnoses:  Fall  AMS (altered mental status)    Rx / DC Orders ED Discharge Orders    None       Kinnie Feil, PA-C 04/16/2020 1821    Charlesetta Shanks, MD 04/26/2020 1031

## 2020-04-28 NOTE — ED Notes (Signed)
Pt has elevated BP suddenly and reportedly to PA decreased sensation in her R foot.  On exam she does report sensation intact to R foot with deep touch and light touch.  Plantar and dorsi flexion.

## 2020-04-28 NOTE — ED Triage Notes (Signed)
Hx of restless leg syndrome and recently changed to gabapentin without relief of sx.  Pt fell OOB last PM and family placed her back in bed, had second episode standing up and fell, without dizziness noted.  States she hit the tv stand and tv.  Denies striking head or LOC.  NO pain to head neck or back.  No pain to chest or abd, no SOB.  C/o pain to LE with cramping.   Pt alert and oriented, ambulates at home (I), but has unsteady gait, and balance issues with standing and still position.  Fluids filled blisters noted to LLE anterior and posterio with red streaks to L leg above the knee laterally.  Redness noted posterior on RLE, both redness appears sunburn like.  These were not noted prior to bed. Denies any incontinence.

## 2020-04-29 ENCOUNTER — Inpatient Hospital Stay (HOSPITAL_COMMUNITY): Payer: Medicare Other

## 2020-04-29 ENCOUNTER — Encounter (HOSPITAL_COMMUNITY): Payer: Self-pay | Admitting: Internal Medicine

## 2020-04-29 DIAGNOSIS — M6282 Rhabdomyolysis: Principal | ICD-10-CM

## 2020-04-29 DIAGNOSIS — E875 Hyperkalemia: Secondary | ICD-10-CM

## 2020-04-29 DIAGNOSIS — G2581 Restless legs syndrome: Secondary | ICD-10-CM

## 2020-04-29 DIAGNOSIS — R531 Weakness: Secondary | ICD-10-CM

## 2020-04-29 DIAGNOSIS — I120 Hypertensive chronic kidney disease with stage 5 chronic kidney disease or end stage renal disease: Secondary | ICD-10-CM

## 2020-04-29 DIAGNOSIS — D72829 Elevated white blood cell count, unspecified: Secondary | ICD-10-CM

## 2020-04-29 LAB — LIPID PANEL
Cholesterol: 157 mg/dL (ref 0–200)
HDL: 56 mg/dL (ref >40)
LDL Cholesterol: 84 mg/dL (ref 0–99)
Total CHOL/HDL Ratio: 2.8 RATIO
Triglycerides: 85 mg/dL (ref <150)
VLDL: 17 mg/dL (ref 0–40)

## 2020-04-29 LAB — BASIC METABOLIC PANEL
Anion gap: 17 — ABNORMAL HIGH (ref 5–15)
Anion gap: 9 (ref 5–15)
BUN: 63 mg/dL — ABNORMAL HIGH (ref 8–23)
BUN: 68 mg/dL — ABNORMAL HIGH (ref 8–23)
CO2: 12 mmol/L — ABNORMAL LOW (ref 22–32)
CO2: 19 mmol/L — ABNORMAL LOW (ref 22–32)
Calcium: 6.7 mg/dL — ABNORMAL LOW (ref 8.9–10.3)
Calcium: 6.6 mg/dL — ABNORMAL LOW (ref 8.9–10.3)
Chloride: 108 mmol/L (ref 98–111)
Chloride: 107 mmol/L (ref 98–111)
Creatinine, Ser: 3.54 mg/dL — ABNORMAL HIGH (ref 0.44–1.00)
Creatinine, Ser: 4.37 mg/dL — ABNORMAL HIGH (ref 0.44–1.00)
GFR calc Af Amer: 15 mL/min — ABNORMAL LOW (ref >60)
GFR calc Af Amer: 12 mL/min — ABNORMAL LOW (ref >60)
GFR calc non Af Amer: 13 mL/min — ABNORMAL LOW (ref >60)
GFR calc non Af Amer: 10 mL/min — ABNORMAL LOW (ref >60)
Glucose, Bld: 101 mg/dL — ABNORMAL HIGH (ref 70–99)
Glucose, Bld: 116 mg/dL — ABNORMAL HIGH (ref 70–99)
Potassium: UNDETERMINED mmol/L (ref 3.5–5.1)
Potassium: 5.7 mmol/L — ABNORMAL HIGH (ref 3.5–5.1)
Sodium: 137 mmol/L (ref 135–145)
Sodium: 135 mmol/L (ref 135–145)

## 2020-04-29 LAB — CBC
HCT: 43.1 % (ref 36.0–46.0)
Hemoglobin: 13.8 g/dL (ref 12.0–15.0)
MCH: 27.8 pg (ref 26.0–34.0)
MCHC: 32 g/dL (ref 30.0–36.0)
MCV: 86.9 fL (ref 80.0–100.0)
Platelets: 275 10*3/uL (ref 150–400)
RBC: 4.96 MIL/uL (ref 3.87–5.11)
RDW: 14.6 % (ref 11.5–15.5)
WBC: 27 10*3/uL — ABNORMAL HIGH (ref 4.0–10.5)
nRBC: 0 % (ref 0.0–0.2)

## 2020-04-29 LAB — COMPREHENSIVE METABOLIC PANEL
ALT: 977 U/L — ABNORMAL HIGH (ref 0–44)
AST: 2253 U/L — ABNORMAL HIGH (ref 15–41)
Albumin: 2.4 g/dL — ABNORMAL LOW (ref 3.5–5.0)
Alkaline Phosphatase: 57 U/L (ref 38–126)
Anion gap: 14 (ref 5–15)
BUN: 58 mg/dL — ABNORMAL HIGH (ref 8–23)
CO2: 15 mmol/L — ABNORMAL LOW (ref 22–32)
Calcium: 6.7 mg/dL — ABNORMAL LOW (ref 8.9–10.3)
Chloride: 108 mmol/L (ref 98–111)
Creatinine, Ser: 3.32 mg/dL — ABNORMAL HIGH (ref 0.44–1.00)
GFR calc Af Amer: 16 mL/min — ABNORMAL LOW (ref >60)
GFR calc non Af Amer: 14 mL/min — ABNORMAL LOW (ref >60)
Glucose, Bld: 218 mg/dL — ABNORMAL HIGH (ref 70–99)
Potassium: 5.5 mmol/L — ABNORMAL HIGH (ref 3.5–5.1)
Sodium: 137 mmol/L (ref 135–145)
Total Bilirubin: 1.4 mg/dL — ABNORMAL HIGH (ref 0.3–1.2)
Total Protein: 5.4 g/dL — ABNORMAL LOW (ref 6.5–8.1)

## 2020-04-29 LAB — CK
Total CK: >50000 U/L — ABNORMAL HIGH (ref 38–234)
Total CK: >50000 U/L — ABNORMAL HIGH (ref 38–234)

## 2020-04-29 LAB — GLUCOSE, CAPILLARY
Glucose-Capillary: 140 mg/dL — ABNORMAL HIGH (ref 70–99)
Glucose-Capillary: 80 mg/dL (ref 70–99)
Glucose-Capillary: 165 mg/dL — ABNORMAL HIGH (ref 70–99)

## 2020-04-29 LAB — POTASSIUM: Potassium: 5.1 mmol/L (ref 3.5–5.1)

## 2020-04-29 LAB — HEMOGLOBIN A1C
Hgb A1c MFr Bld: 6.3 % — ABNORMAL HIGH (ref 4.8–5.6)
Mean Plasma Glucose: 134.11 mg/dL

## 2020-04-29 MED ORDER — LACTATED RINGERS IV BOLUS
1000.0000 mL | Freq: Once | INTRAVENOUS | Status: AC
  Administered 2020-04-29: 1000 mL via INTRAVENOUS

## 2020-04-29 MED ORDER — LACTATED RINGERS IV SOLN: INTRAVENOUS | Status: DC

## 2020-04-29 MED ORDER — SODIUM ZIRCONIUM CYCLOSILICATE 5 G PO PACK
10.0000 g | PACK | Freq: Three times a day (TID) | ORAL | Status: DC
  Administered 2020-04-29 – 2020-05-01 (×6): 10 g via ORAL
  Filled 2020-04-29: qty 2
  Filled 2020-04-29: qty 1
  Filled 2020-04-29: qty 2
  Filled 2020-04-29 (×2): qty 1
  Filled 2020-04-29: qty 2

## 2020-04-29 MED ORDER — FUROSEMIDE 10 MG/ML IJ SOLN
100.0000 mg | Freq: Once | INTRAVENOUS | Status: AC
  Administered 2020-04-29: 100 mg via INTRAVENOUS
  Filled 2020-04-29: qty 10

## 2020-04-29 NOTE — Evaluation (Signed)
Physical Therapy Evaluation Patient Details Name: [name] MRN: [id number] DOB: [birth year] Today's Date: 04/29/2020   History of Present Illness  [name] is a 64 year old female with PMHx of hypertension, CKDIII, and restless leg syndrome admitted for rhabdomyolysis  Clinical Impression  Pt admitted with above diagnosis. Pt was unable to come to eOB with +1 assist due to pain bil LEs and pt not able to use her UEs to help.  Attempts made however PT unable to get pt to EOB.  Will probably needs SNF for rehab due to weakness and pt with intermittent assist on d/c.   Pt currently with functional limitations due to the deficits listed below (see PT Problem List). Pt will benefit from skilled PT to increase their independence and safety with mobility to allow discharge to the venue listed below.      Follow Up Recommendations SNF;Supervision/Assistance - 24 hour    Equipment Recommendations  Other (comment) (TBA)    Recommendations for Other Services       Precautions / Restrictions Precautions Precautions: Fall Restrictions Weight Bearing Restrictions: No      Mobility  Bed Mobility Overal bed mobility: Needs Assistance Bed Mobility: Rolling;Sidelying to Sit Rolling: Total assist;+2 for physical assistance Sidelying to sit: Total assist;+2 for physical assistance       General bed mobility comments: Could not get pt to EOB due to pain bil LEs as well as could not use UEs efectively.   Transfers                 General transfer comment: unable  Ambulation/Gait             General Gait Details: unable  Stairs            Wheelchair Mobility    Modified Rankin (Stroke Patients Only)       Balance Overall balance assessment: Needs assistance                                           Pertinent Vitals/Pain Pain Assessment: Faces Faces Pain Scale: Hurts even more Pain Location: LEs with movement Pain Descriptors  / Indicators: Aching;Grimacing;Guarding;Discomfort    Home Living Family/patient expects to be discharged to:: Private residence Living Arrangements: Children Available Help at Discharge: Family;Available PRN/intermittently Type of Home: Apartment (basement apartment in daughter home) Home Access: Level entry     Home Layout: One level Home Equipment: [place] - 2 wheels;Bedside commode;Grab bars - tub/shower;Cane - single point      Prior Function Level of Independence: Needs assistance   Gait / Transfers Assistance Needed: Walked without device around house  ADL's / Homemaking Assistance Needed: drives, cares for grandchildren, indpendent in ADL and IADL        Hand Dominance   Dominant Hand: Right    Extremity/Trunk Assessment   Upper Extremity Assessment Upper Extremity Assessment: Generalized weakness (mild tremor)    Lower Extremity Assessment Lower Extremity Assessment: RLE deficits/detail;LLE deficits/detail RLE Deficits / Details: grossly 2-/5, painful and pt would not move them much at all LLE Deficits / Details: grossly 2-/5, painful    Cervical / Trunk Assessment Cervical / Trunk Assessment: Kyphotic  Communication   Communication: No difficulties  Cognition Arousal/Alertness: Awake/alert Behavior During Therapy: WFL for tasks assessed/performed Overall Cognitive Status: Within Functional Limits for tasks assessed  General Comments General comments (skin integrity, edema, etc.): 91 bpm, 100% RA, 168/86, 25    Exercises General Exercises - Lower Extremity Heel Slides: AAROM;Both;5 reps;Supine   Assessment/Plan    PT Assessment Patient needs continued PT services  PT Problem List Decreased activity tolerance;Decreased balance;Decreased mobility;Decreased knowledge of use of DME;Decreased safety awareness;Decreased knowledge of precautions;Obesity;Pain;Decreased strength;Decreased range of  motion       PT Treatment Interventions DME instruction;Gait training;Functional mobility training;Therapeutic activities;Therapeutic exercise;Balance training;Patient/family education;Cognitive remediation    PT Goals (Current goals can be found in the Care Plan section)  Acute Rehab PT Goals Patient Stated Goal: to go home PT Goal Formulation: With patient Time For Goal Achievement: 05/13/20 Potential to Achieve Goals: Fair    Frequency Min 3X/week   Barriers to discharge Decreased caregiver support      Co-evaluation               AM-PAC PT "6 Clicks" Mobility  Outcome Measure Help needed turning from your back to your side while in a flat bed without using bedrails?: Total Help needed moving from lying on your back to sitting on the side of a flat bed without using bedrails?: Total Help needed moving to and from a bed to a chair (including a wheelchair)?: Total Help needed standing up from a chair using your arms (e.g., wheelchair or bedside chair)?: Total Help needed to walk in hospital room?: Total Help needed climbing 3-5 steps with a railing? : Total 6 Click Score: 6    End of Session   Activity Tolerance: Patient limited by fatigue;Patient limited by pain Patient left: in bed;with call bell/phone within reach Nurse Communication: Mobility status;Need for lift equipment PT Visit Diagnosis: Muscle weakness (generalized) (M62.81);Pain Pain - part of body: Leg (bilaterally)    Time: 0258-5277 PT Time Calculation (min) (ACUTE ONLY): 11 min   Charges:   PT Evaluation $PT Eval Moderate Complexity: 1 Mod          Tangee Marszalek W,PT Acute Rehabilitation Services Pager:  [phone]  Office:  [phone]    Denice Paradise 04/29/2020, 12:25 PM

## 2020-04-29 NOTE — Progress Notes (Signed)
Subjective: HD 1 Overnight, patient hyperkalemic for which patient given lokelma, calcium, insulin and albuterol.    Objective:  Vital signs in last 24 hours: Vitals:   04/29/20 0037 04/29/20 0412 04/29/20 0458 04/29/20 0841  BP: 127/70 (!) 141/73    Pulse: 99     Resp: 20     Temp: 99 F (37.2 C) 98.4 F (36.9 C)  98.2 F (36.8 C)  TempSrc: Oral Oral  Oral  SpO2: 96%     Weight: 83.6 kg  84.6 kg   Height: 5\' 2"  (1.575 m)      CBC Latest Ref Rng & Units 04/29/2020 05/05/2020 04/27/2020  WBC 4.0 - 10.5 K/uL 27.0(H) - 28.1(H)  Hemoglobin 12.0 - 15.0 g/dL 13.8 17.3(H) 15.1(H)  Hematocrit 36 - 46 % 43.1 51.0(H) 48.6(H)  Platelets 150 - 400 K/uL 275 - 352   CMP Latest Ref Rng & Units 04/29/2020 04/29/2020 04/29/2020  Glucose 70 - 99 mg/dL 218(H) - 149(H)  BUN 8 - 23 mg/dL 58(H) - 52(H)  Creatinine 0.44 - 1.00 mg/dL 3.32(H) - 2.44(H)  Sodium 135 - 145 mmol/L 137 - 139  Potassium 3.5 - 5.1 mmol/L 5.5(H) 5.1 7.2(HH)  Chloride 98 - 111 mmol/L 108 - 107  CO2 22 - 32 mmol/L 15(L) - 18(L)  Calcium 8.9 - 10.3 mg/dL 6.7(L) - 6.8(L)  Total Protein 6.5 - 8.1 g/dL 5.4(L) - -  Total Bilirubin 0.3 - 1.2 mg/dL 1.4(H) - -  Alkaline Phos 38 - 126 U/L 57 - -  AST 15 - 41 U/L 2,253(H) - -  ALT 0 - 44 U/L 977(H) - -   Total CK 38.0 - 234.0 U/L >50,000High  >50,000High CM  49,279High   Physical Exam  Constitutional: Appears well-developed and well-nourished. No distress.  HEENT: normocephalic and atraumatic; conjunctivae nl, EOMI, MMM Cardiovascular: RRR, S1 and S2 present, no murmurs/rubs/gallops appreciated  Respiratory: Effort normal and breath sounds normal. No respiratory distress. No wheezes, rhonchi, or rales noted  GI: Soft. Bowel sounds are normal. No distension. There is no tenderness.  Musculoskeletal: Nonpitting edema of bilateral lower extremities; continued weakness of bilateral lower extremities  Neurological: Is alert and oriented x4, no apparent focal neurologic  deficits Skin: Not diaphoretic. No erythema. Persistent bullae noted on bilateral lower extremities extending from inner thigh down the leg; nikolsky's sign negative; no surrounding signs of infection; nontender to palpation  Assessment/Plan:  [name] is a 64 year old female with PMHx of hypertension, CKDIII, and restless leg syndrome admitted for rhabdomyolysis  Rhabdomyolysis AKI on CKD III Hyperkalemia Patient admitted with rhabdomyolysis after being found down for 3-4 hours. She was noted to have elevated CK with AKI and hyperkalemia. Patient started on fluids. She was given lokelma, calcium, insulin and albuterol for hyperkalemia of 7.2 overnight. Patient has received ~6L of fluids since admission. However, CK remains >50,000. Also noted to have worsening transaminitis in setting of ongoing rhabdomyolysis. Patient has not voided yet and overnight bladder scan with only 90cc of urine despite fluid resuscitation. Patient appears euvolemic on examination.Given concern for worsening renal function, will consult nephrology.  - Nephrology consulted, appreciate recommendations - Will hold off on further fluid resuscitation at this time, pending nephrology recs - Continue to monitor CK and BMP - Lokelma 5mg  bid  - Continue cardiac monitoring  Leukocytosis: Patient presented with leukocytosis without source of infection. Improving this morning. Patient remains afebrile. No significant findings suggestive of infectious etiology at this time. Suspect in setting of acute  illness. - Continue to monitor  - CBC daily   Bilateral lower extremity weakness 2/2 deconditioning vs myolysis  Patient with continued bilateral lower extremity weakness, although slightly improved from prior. CT head and MRI brain negative for acute CVA. Suspect her weakness in setting of deconditioning vs myolysis. - PT/OT eval  Bilateral lower extremity dermatologic changes Presents with multiple large fluid filled  vesicles in bilateral lower extremities which she reports as initially noticing earlier in the week. 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  - Continue to monitor   Hypertension: Currently hypertensive with SBP 150-170s in setting of fluid resuscitation. However, appears euvolemic on examination. No need for diuretic at this time. - Continue amlodipine 10mg  daily    FEN/GI: Diet: HH/CM Fluids: LR 200cc/hr Electrolytes: Monitor and replete prn  DVT Prophylaxis: Lovenox  Code status: FULL Family communication: Attempted, unable to get in contact with listed contact  Prior to Admission Living Arrangement: Home Anticipated Discharge Location: Home w/HH PT vs SNF Barriers to Discharge: Continued medical management  Dispo: Anticipated discharge in approximately 2-3 day(s).   Harvie Heck, MD  Internal Medicine, PGY-1 04/29/2020, 9:23 AM Pager: [phone] After 5pm on weekdays and 1pm on weekends: On Call pager [phone]

## 2020-04-29 NOTE — Plan of Care (Signed)
Dr. Dola Factor made aware of patients no void since admit and IVF at 200 cc/hr. Bladder scan 91 cc. Call back received from Dr. Dola Factor and patient status reviewed with him.

## 2020-04-29 NOTE — Plan of Care (Signed)
Plan of care initiated.

## 2020-04-29 NOTE — Evaluation (Signed)
Occupational Therapy Evaluation Patient Details Name: [name] MRN: [id number] DOB: [birth year] Today's Date: 04/29/2020    History of Present Illness [name] is a 64 year old female with PMHx of hypertension, CKDIII, and restless leg syndrome admitted for rhabdomyolysis and fall.    Clinical Impression   Pt was independent prior to admission. Presents with profound weakness, pain, fluid filled blisters on her LEs, and mild UE tremor with impaired UE coordination. Pt requires min to total assist for ADL and +2 assist for bed level mobility. She is unable to attempt standing. Recommend continued rehab in SNF prior to return home. Will follow acutely.    Follow Up Recommendations  SNF;Supervision/Assistance - 24 hour    Equipment Recommendations   (defer to next venue)    Recommendations for Other Services       Precautions / Restrictions Precautions Precautions: Fall Precaution Comments: fluid filled blisters on LEs Restrictions Weight Bearing Restrictions: No      Mobility Bed Mobility Overal bed mobility: Needs Assistance Bed Mobility: Rolling;Sidelying to Sit Rolling: Total assist;+2 for physical assistance Sidelying to sit: Total assist;+2 for physical assistance       General bed mobility comments: pt with significant weakness and inability to assist  Transfers                 General transfer comment: unable    Balance                                           ADL either performed or assessed with clinical judgement   ADL Overall ADL's : Needs assistance/impaired Eating/Feeding: Minimal assistance;Bed level   Grooming: Oral care;Minimal assistance;Bed level   Upper Body Bathing: Maximal assistance;Bed level   Lower Body Bathing: Total assistance;Bed level   Upper Body Dressing : Moderate assistance;Bed level   Lower Body Dressing: Total assistance;Bed level       Toileting- Clothing Manipulation and  Hygiene: Total assistance;Bed level               Vision Baseline Vision/History: Wears glasses Wears Glasses: At all times Patient Visual Report: No change from baseline Additional 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     Perception     Praxis      Pertinent Vitals/Pain Pain Assessment: Faces Faces Pain Scale: Hurts even more Pain Location: LEs with movement Pain Descriptors / Indicators: Aching;Grimacing;Guarding;Discomfort     Hand Dominance Right   Extremity/Trunk Assessment Upper Extremity Assessment Upper Extremity Assessment: Generalized weakness (mild tremor)   Lower Extremity Assessment Lower Extremity Assessment: RLE deficits/detail;LLE deficits/detail RLE Deficits / Details: grossly 2-/5, painful and pt would not move them much at all LLE Deficits / Details: grossly 2-/5, painful   Cervical / Trunk Assessment Cervical / Trunk Assessment: Kyphotic   Communication Communication Communication: No difficulties   Cognition Arousal/Alertness: Awake/alert Behavior During Therapy: WFL for tasks assessed/performed Overall Cognitive Status: Within Functional Limits for tasks assessed                                     General Comments       Exercises     Shoulder Instructions      Home Living Family/patient expects to be discharged to:: Private residence Living Arrangements: Children (daughter and her family) Available  Help at Discharge: Family;Available PRN/intermittently Type of Home: Apartment (basement apartment in daughter's home) Home Access: Level entry     Home Layout: One level     Bathroom Shower/Tub: Teacher, early years/pre: Standard     Home Equipment: Environmental consultant - 2 wheels;Bedside commode;Grab bars - tub/shower;Cane - single point (DME was her deceased husband's, no pot for [facility])          Prior Functioning/Environment Level of Independence: Independent  Gait / Transfers Assistance Needed: Walked  without device around house ADL's / Homemaking Assistance Needed: drives, cares for grandchildren and her elderly mom, independent in ADL and IADL            OT Problem List: Decreased strength;Decreased activity tolerance;Impaired balance (sitting and/or standing);Decreased coordination;Decreased knowledge of use of DME or AE;Obesity;Impaired UE functional use;Pain      OT Treatment/Interventions: Self-care/ADL training;DME and/or AE instruction;Patient/family education;Balance training;Therapeutic activities;Therapeutic exercise    OT Goals(Current goals can be found in the care plan section) Acute Rehab OT Goals Patient Stated Goal: to go home OT Goal Formulation: With patient Time For Goal Achievement: 05/13/20 Potential to Achieve Goals: Good ADL Goals Pt Will Perform Eating: sitting;bed level;Independently Pt Will Perform Grooming: with set-up;sitting Pt Will Perform Upper Body Bathing: with min assist;sitting Pt Will Perform Upper Body Dressing: with min assist;sitting Pt Will Transfer to Toilet: with +2 assist;with mod assist;stand pivot transfer;bedside commode Pt/caregiver will Perform Home Exercise Program: Increased strength;Both right and left upper extremity;Independently (AROM progressing resistance) Additional ADL Goal #1: Pt will perform bed mobility with mod assist in preparation for ADL.  OT Frequency: Min 2X/week   Barriers to D/C:            Co-evaluation              AM-PAC OT "6 Clicks" Daily Activity     Outcome Measure Help from another person eating meals?: A Little Help from another person taking care of personal grooming?: A Little Help from another person toileting, which includes using toliet, bedpan, or urinal?: Total Help from another person bathing (including washing, rinsing, drying)?: A Lot Help from another person to put on and taking off regular upper body clothing?: A Lot Help from another person to put on and taking off regular  lower body clothing?: Total 6 Click Score: 12   End of Session    Activity Tolerance: Patient limited by pain Patient left: in bed;with call bell/phone within reach;with bed alarm set  OT Visit Diagnosis: Pain;Muscle weakness (generalized) (M62.81)                Time: 2952-8413 OT Time Calculation (min): 21 min Charges:  OT General Charges $OT Visit: 1 Visit OT Evaluation $OT Eval Moderate Complexity: 1 Mod  Nestor Lewandowsky, OTR/L Acute Rehabilitation Services Pager: [phone] Office: [phone]  Malka So 04/29/2020, 1:33 PM

## 2020-04-29 NOTE — Consult Note (Signed)
Renal Service Consult Note [facility]  [name] 04/29/2020 [name] Requesting Physician:  Dr Heber [name], E.   Reason for Consult:  AKI on CKD3 HPI: The patient is a 64 y.o. year-old w/ hx of RLS, CKD 3, balance disorder. Pt fell OOB last night and family placed her back in bed. Had 2nd episode of falling OOB then and couldn't get help for sometime. In ED lab work showed WBC 28k and CPK > 50,000. Urine was "coke-colored". Creat 1.95 (b/l 1.3).  She was admitted and rec'd 4.5 L of IVF"s mostly LR overnight, but has not voided this am. We are asked to see for AKI.    Creat 1.95 on admit yesterday, then up to 2.4 at 7pm  yest and 3.54 at 10 am today. K+ was 5.6 > peaked at 7.2 , was given IV meds in ED and down to 5.5 last checked.   Pt seen in room.  F/u PCP , has "stage 3" kidney disease.  No voiding issues. Some R leg discomfort where she was lying on her side.  +edema was prior to admission (?).  No SOB or cough, no CP.    ROS  denies CP  no joint pain   no HA  no blurry vision  no rash  no diarrhea  no nausea/ vomiting  no dysuria  no difficulty voiding  no change in urine color    Past Medical History  Past Medical History:  Diagnosis Date  . Balance disorder   . Hypertension   . Renal disorder    Chronic kidney disease   Past Surgical History  Past Surgical History:  Procedure Laterality Date  . CESAREAN SECTION     Family History History reviewed. No pertinent family history. Social History  reports that she has never smoked. She has never used smokeless tobacco. She reports previous alcohol use. She reports that she does not use drugs. Allergies  Allergies  Allergen Reactions  . Olmesartan Other (See Comments)    GFR drop  . Requip [Ropinirole] Other (See Comments)    Overactive bladder  . Valsartan Other (See Comments)    GFR drop   Home medications Prior to Admission medications   Medication Sig Start Date End Date Taking?  Authorizing Provider  amLODipine (NORVASC) 10 MG tablet Take 10 mg by mouth daily. 04/01/20  Yes [provider]  gabapentin (NEURONTIN) 100 MG capsule Take 100-200 mg by mouth See admin instructions. Take 200 mg by mouth midday and 100 mg two hours before bedtime 04/24/20  Yes [provider]  rOPINIRole (REQUIP) 2 MG tablet Take by mouth. Patient not taking: Reported on 04/26/2020 03/29/20   [provider]     Vitals:   04/29/20 8185 04/29/20 0458 04/29/20 0841 04/29/20 1145  BP: (!) 141/73     Pulse:      Resp:    18  Temp: 98.4 F (36.9 C)  98.2 F (36.8 C) 97.7 F (36.5 C)  TempSrc: Oral  Oral Oral  SpO2:      Weight:  84.6 kg    Height:       Exam Gen alert, deconditioned appearance No rash, cyanosis or gangrene Sclera anicteric, throat clear  No jvd or bruits Chest clear bilat to bases no rales, wheezing or bronchial BS RRR no MRG Abd soft ntnd no mass or ascites +bs GU defer MS no joint effusions or deformity Ext 1-2+ bilat hip and pretib LE > UE edema, a  few small blisters Neuro is alert, Ox 3 , nf    Home meds:  - norvasc 10 qd  - neurontin 200 lunch+ 100 hs    in Care everywhere on  01/11/20 creat = 1.35 , eGFR 42 ml/min  UA 6/18 - large Hb, 5 ketone, 100 prot, rare bact, 0-5 wbc/ rbc  CXR 6/18 - no acute abnormality  Assessment/ Plan: 1.  AoCKD 3- b/l creat 1.3 in March 2021, eGFR 46.  Was on the floor for undetermined amount of time at home yesterday and presenting w/ AKI , oliguric w/ CPK > 50,000 indicating significant rhabdomyolysis.  K+ was high but improving w/ IV and po meds.  Not making urine here which is concerning. Needs renal US and I/O cath to make sure there is no retention.  No signs of uremia.  Vol overloaded on exam so would not push any more fluids and would fluid restrict to 1200 cc/d.  Otherwise Rx is supportive care, try to control K+, and do HD if / when pt would become uremic.  Will follow.   2. Acute  rhabdomyolysis - supportive care 3. Hyperkalemia - renal diet, lokelma tid for now 4. HTN - cont home norvasc, prn IV meds 5. Vol overload - will give one dose IV lasix  6. RLS - on gabapentin at home at low dose, not getting here yet    Brittany Splinter  MD 04/29/2020, 12:13 PM  Recent Labs  Lab 04/23/2020 1139 05/10/2020 1139 04/24/2020 1816 04/29/20 0215  WBC 28.1*  --   --  27.0*  HGB 15.1*   < > 17.3* 13.8   < > = values in this interval not displayed.   Recent Labs  Lab 04/19/2020 1933 04/29/20 0215  K 7.2* 5.5*  5.1  BUN 52* 58*  CREATININE 2.44* 3.32*  CALCIUM 6.8* 6.7*

## 2020-04-30 ENCOUNTER — Inpatient Hospital Stay (HOSPITAL_COMMUNITY): Payer: Medicare Other

## 2020-04-30 ENCOUNTER — Inpatient Hospital Stay (HOSPITAL_COMMUNITY): Payer: Medicare Other | Admitting: Anesthesiology

## 2020-04-30 ENCOUNTER — Encounter (HOSPITAL_COMMUNITY): Payer: Self-pay | Admitting: Internal Medicine

## 2020-04-30 ENCOUNTER — Inpatient Hospital Stay (HOSPITAL_COMMUNITY): Payer: Medicare Other | Admitting: Certified Registered"

## 2020-04-30 ENCOUNTER — Encounter (HOSPITAL_COMMUNITY): Admission: EM | Disposition: E | Payer: Self-pay | Source: Home / Self Care | Attending: Critical Care Medicine

## 2020-04-30 DIAGNOSIS — J9690 Respiratory failure, unspecified, unspecified whether with hypoxia or hypercapnia: Secondary | ICD-10-CM | POA: Diagnosis present

## 2020-04-30 HISTORY — PX: THROMBECTOMY FEMORAL ARTERY: SHX6406

## 2020-04-30 LAB — GLUCOSE, CAPILLARY
Glucose-Capillary: 153 mg/dL — ABNORMAL HIGH (ref 70–99)
Glucose-Capillary: 120 mg/dL — ABNORMAL HIGH (ref 70–99)
Glucose-Capillary: 98 mg/dL (ref 70–99)
Glucose-Capillary: 159 mg/dL — ABNORMAL HIGH (ref 70–99)
Glucose-Capillary: 203 mg/dL — ABNORMAL HIGH (ref 70–99)

## 2020-04-30 LAB — CBC
HCT: 34.6 % — ABNORMAL LOW (ref 36.0–46.0)
HCT: 14.4 % — ABNORMAL LOW (ref 36.0–46.0)
Hemoglobin: 11 g/dL — ABNORMAL LOW (ref 12.0–15.0)
Hemoglobin: 4.2 g/dL — CL (ref 12.0–15.0)
MCH: 28.4 pg (ref 26.0–34.0)
MCH: 27.6 pg (ref 26.0–34.0)
MCHC: 31.8 g/dL (ref 30.0–36.0)
MCHC: 29.2 g/dL — ABNORMAL LOW (ref 30.0–36.0)
MCV: 89.2 fL (ref 80.0–100.0)
MCV: 94.7 fL (ref 80.0–100.0)
Platelets: 236 10*3/uL (ref 150–400)
Platelets: 189 10*3/uL (ref 150–400)
RBC: 3.88 MIL/uL (ref 3.87–5.11)
RBC: 1.52 MIL/uL — ABNORMAL LOW (ref 3.87–5.11)
RDW: 14.6 % (ref 11.5–15.5)
RDW: 14.6 % (ref 11.5–15.5)
WBC: 20.9 10*3/uL — ABNORMAL HIGH (ref 4.0–10.5)
WBC: 24 10*3/uL — ABNORMAL HIGH (ref 4.0–10.5)
nRBC: 0 % (ref 0.0–0.2)
nRBC: 0.1 % (ref 0.0–0.2)

## 2020-04-30 LAB — BASIC METABOLIC PANEL
Anion gap: 13 (ref 5–15)
Anion gap: 12 (ref 5–15)
Anion gap: 15 (ref 5–15)
Anion gap: 16 — ABNORMAL HIGH (ref 5–15)
BUN: 79 mg/dL — ABNORMAL HIGH (ref 8–23)
BUN: 88 mg/dL — ABNORMAL HIGH (ref 8–23)
BUN: 94 mg/dL — ABNORMAL HIGH (ref 8–23)
BUN: 88 mg/dL — ABNORMAL HIGH (ref 8–23)
CO2: 16 mmol/L — ABNORMAL LOW (ref 22–32)
CO2: 17 mmol/L — ABNORMAL LOW (ref 22–32)
CO2: 13 mmol/L — ABNORMAL LOW (ref 22–32)
CO2: 11 mmol/L — ABNORMAL LOW (ref 22–32)
Calcium: 6.5 mg/dL — ABNORMAL LOW (ref 8.9–10.3)
Calcium: 6.5 mg/dL — ABNORMAL LOW (ref 8.9–10.3)
Calcium: 6.6 mg/dL — ABNORMAL LOW (ref 8.9–10.3)
Calcium: 5.7 mg/dL — CL (ref 8.9–10.3)
Chloride: 103 mmol/L (ref 98–111)
Chloride: 105 mmol/L (ref 98–111)
Chloride: 104 mmol/L (ref 98–111)
Chloride: 107 mmol/L (ref 98–111)
Creatinine, Ser: 5.27 mg/dL — ABNORMAL HIGH (ref 0.44–1.00)
Creatinine, Ser: 6.09 mg/dL — ABNORMAL HIGH (ref 0.44–1.00)
Creatinine, Ser: 6.73 mg/dL — ABNORMAL HIGH (ref 0.44–1.00)
Creatinine, Ser: 6.63 mg/dL — ABNORMAL HIGH (ref 0.44–1.00)
GFR calc Af Amer: 9 mL/min — ABNORMAL LOW (ref >60)
GFR calc Af Amer: 8 mL/min — ABNORMAL LOW (ref >60)
GFR calc Af Amer: 7 mL/min — ABNORMAL LOW (ref >60)
GFR calc Af Amer: 7 mL/min — ABNORMAL LOW (ref >60)
GFR calc non Af Amer: 8 mL/min — ABNORMAL LOW (ref >60)
GFR calc non Af Amer: 7 mL/min — ABNORMAL LOW (ref >60)
GFR calc non Af Amer: 6 mL/min — ABNORMAL LOW (ref >60)
GFR calc non Af Amer: 6 mL/min — ABNORMAL LOW (ref >60)
Glucose, Bld: 129 mg/dL — ABNORMAL HIGH (ref 70–99)
Glucose, Bld: 111 mg/dL — ABNORMAL HIGH (ref 70–99)
Glucose, Bld: 145 mg/dL — ABNORMAL HIGH (ref 70–99)
Glucose, Bld: 184 mg/dL — ABNORMAL HIGH (ref 70–99)
Potassium: 5.2 mmol/L — ABNORMAL HIGH (ref 3.5–5.1)
Potassium: 6.3 mmol/L (ref 3.5–5.1)
Potassium: 6.5 mmol/L (ref 3.5–5.1)
Potassium: 7.3 mmol/L (ref 3.5–5.1)
Sodium: 132 mmol/L — ABNORMAL LOW (ref 135–145)
Sodium: 134 mmol/L — ABNORMAL LOW (ref 135–145)
Sodium: 132 mmol/L — ABNORMAL LOW (ref 135–145)
Sodium: 134 mmol/L — ABNORMAL LOW (ref 135–145)

## 2020-04-30 LAB — PREPARE RBC (CROSSMATCH)

## 2020-04-30 LAB — PROTIME-INR
INR: 1.1 (ref 0.8–1.2)
Prothrombin Time: 13.9 seconds (ref 11.4–15.2)

## 2020-04-30 LAB — CK: Total CK: >50000 U/L — ABNORMAL HIGH (ref 38–234)

## 2020-04-30 LAB — PHOSPHORUS: Phosphorus: 9.7 mg/dL — ABNORMAL HIGH (ref 2.5–4.6)

## 2020-04-30 SURGERY — THROMBECTOMY, ARTERY, FEMORAL
Anesthesia: General | Laterality: Right

## 2020-04-30 MED ORDER — INSULIN ASPART 100 UNIT/ML IV SOLN: 5.0000 [IU] | Freq: Once | INTRAVENOUS | Status: DC

## 2020-04-30 MED ORDER — INSULIN ASPART 100 UNIT/ML IV SOLN
10.0000 [IU] | Freq: Once | INTRAVENOUS | Status: AC
  Administered 2020-04-30: 10 [IU] via INTRAVENOUS

## 2020-04-30 MED ORDER — CALCIUM GLUCONATE-NACL 1-0.675 GM/50ML-% IV SOLN
1.0000 g | Freq: Once | INTRAVENOUS | Status: AC
  Administered 2020-05-01: 1000 mg via INTRAVENOUS
  Filled 2020-04-30: qty 50

## 2020-04-30 MED ORDER — PRISMASOL BGK 0/2.5 32-2.5 MEQ/L REPLACEMENT SOLN
Status: DC
  Filled 2020-04-30 (×5): qty 5000

## 2020-04-30 MED ORDER — LIDOCAINE 2% (20 MG/ML) 5 ML SYRINGE
INTRAMUSCULAR | Status: AC
  Filled 2020-04-30: qty 5

## 2020-04-30 MED ORDER — HEPARIN SODIUM (PORCINE) 1000 UNIT/ML IJ SOLN
INTRAMUSCULAR | Status: DC | PRN
  Administered 2020-04-30: 2800 [IU]

## 2020-04-30 MED ORDER — ONDANSETRON HCL 4 MG/2ML IJ SOLN
INTRAMUSCULAR | Status: AC
  Filled 2020-04-30: qty 2

## 2020-04-30 MED ORDER — PHENYLEPHRINE 40 MCG/ML (10ML) SYRINGE FOR IV PUSH (FOR BLOOD PRESSURE SUPPORT)
PREFILLED_SYRINGE | INTRAVENOUS | Status: AC
  Filled 2020-04-30: qty 10

## 2020-04-30 MED ORDER — HEPARIN BOLUS VIA INFUSION
2000.0000 [IU] | Freq: Once | INTRAVENOUS | Status: AC
  Administered 2020-04-30: 2000 [IU] via INTRAVENOUS
  Filled 2020-04-30: qty 2000

## 2020-04-30 MED ORDER — HEPARIN SODIUM (PORCINE) 5000 UNIT/ML IJ SOLN
5000.0000 [IU] | Freq: Three times a day (TID) | INTRAMUSCULAR | Status: DC
  Administered 2020-04-30 (×2): 5000 [IU] via SUBCUTANEOUS
  Filled 2020-04-30 (×2): qty 1

## 2020-04-30 MED ORDER — FENTANYL CITRATE (PF) 250 MCG/5ML IJ SOLN
INTRAMUSCULAR | Status: AC
  Filled 2020-04-30: qty 5

## 2020-04-30 MED ORDER — OXYCODONE HCL 5 MG/5ML PO SOLN: 5.0000 mg | Freq: Once | ORAL | Status: DC | PRN

## 2020-04-30 MED ORDER — PAPAVERINE HCL 30 MG/ML IJ SOLN
INTRAMUSCULAR | Status: AC
  Filled 2020-04-30: qty 2

## 2020-04-30 MED ORDER — DEXAMETHASONE SODIUM PHOSPHATE 10 MG/ML IJ SOLN
INTRAMUSCULAR | Status: DC | PRN
  Administered 2020-04-30: 10 mg via INTRAVENOUS

## 2020-04-30 MED ORDER — HEPARIN (PORCINE) 25000 UT/250ML-% IV SOLN
1400.0000 [IU]/h | INTRAVENOUS | Status: DC
  Administered 2020-04-30: 1400 [IU]/h via INTRAVENOUS
  Filled 2020-04-30: qty 250

## 2020-04-30 MED ORDER — ROCURONIUM 10MG/ML (10ML) SYRINGE FOR MEDFUSION PUMP - OPTIME
INTRAVENOUS | Status: DC | PRN
Start: 2020-04-30 — End: 2020-04-30
  Administered 2020-04-30: 100 mg via INTRAVENOUS

## 2020-04-30 MED ORDER — ONDANSETRON HCL 4 MG/2ML IJ SOLN
INTRAMUSCULAR | Status: DC | PRN
  Administered 2020-04-30: 4 mg via INTRAVENOUS

## 2020-04-30 MED ORDER — PROPOFOL 10 MG/ML IV BOLUS
INTRAVENOUS | Status: AC
  Filled 2020-04-30: qty 20

## 2020-04-30 MED ORDER — VASOPRESSIN 20 UNIT/ML IV SOLN
INTRAVENOUS | Status: DC | PRN
  Administered 2020-04-30 (×2): 2 [IU] via INTRAVENOUS
  Administered 2020-04-30: 1 [IU] via INTRAVENOUS
  Administered 2020-04-30: 2 [IU] via INTRAVENOUS

## 2020-04-30 MED ORDER — DOCUSATE SODIUM 100 MG PO CAPS: 100.0000 mg | ORAL_CAPSULE | Freq: Two times a day (BID) | ORAL | Status: DC | PRN

## 2020-04-30 MED ORDER — PROPOFOL 10 MG/ML IV BOLUS
INTRAVENOUS | Status: DC | PRN
  Administered 2020-04-30: 100 mg via INTRAVENOUS

## 2020-04-30 MED ORDER — PHENYLEPHRINE HCL-NACL 10-0.9 MG/250ML-% IV SOLN
0.0000 ug/min | INTRAVENOUS | Status: DC
  Administered 2020-04-30: 100 ug/min via INTRAVENOUS
  Administered 2020-04-30: 140 ug/min via INTRAVENOUS
  Administered 2020-04-30: 100 ug/min via INTRAVENOUS
  Administered 2020-05-01: 120 ug/min via INTRAVENOUS
  Filled 2020-04-30: qty 500
  Filled 2020-04-30 (×4): qty 250

## 2020-04-30 MED ORDER — ROCURONIUM BROMIDE 50 MG/5ML IV SOLN
50.0000 mg | Freq: Once | INTRAVENOUS | Status: DC
  Filled 2020-04-30: qty 5

## 2020-04-30 MED ORDER — DEXAMETHASONE SODIUM PHOSPHATE 10 MG/ML IJ SOLN
INTRAMUSCULAR | Status: AC
  Filled 2020-04-30: qty 1

## 2020-04-30 MED ORDER — FENTANYL CITRATE (PF) 250 MCG/5ML IJ SOLN
INTRAMUSCULAR | Status: DC | PRN
  Administered 2020-04-30 (×2): 50 ug via INTRAVENOUS

## 2020-04-30 MED ORDER — SODIUM CHLORIDE 0.9% IV SOLUTION: Freq: Once | INTRAVENOUS | Status: AC

## 2020-04-30 MED ORDER — ROCURONIUM BROMIDE 10 MG/ML (PF) SYRINGE
PREFILLED_SYRINGE | INTRAVENOUS | Status: AC
  Filled 2020-04-30: qty 10

## 2020-04-30 MED ORDER — 0.9 % SODIUM CHLORIDE (POUR BTL) OPTIME
TOPICAL | Status: DC | PRN
  Administered 2020-04-30: 1000 mL

## 2020-04-30 MED ORDER — HEMOSTATIC AGENTS (NO CHARGE) OPTIME
TOPICAL | Status: DC | PRN
  Administered 2020-04-30: 1 via TOPICAL

## 2020-04-30 MED ORDER — LIDOCAINE 2% (20 MG/ML) 5 ML SYRINGE
INTRAMUSCULAR | Status: DC | PRN
  Administered 2020-04-30: 100 mg via INTRAVENOUS

## 2020-04-30 MED ORDER — PROPOFOL 10 MG/ML IV BOLUS
INTRAVENOUS | Status: DC | PRN
  Administered 2020-04-30: 50 mg via INTRAVENOUS

## 2020-04-30 MED ORDER — ALTEPLASE 2 MG IJ SOLR
2.0000 mg | Freq: Once | INTRAMUSCULAR | Status: DC | PRN
  Filled 2020-04-30: qty 2

## 2020-04-30 MED ORDER — HEPARIN SODIUM (PORCINE) 1000 UNIT/ML IJ SOLN
INTRAMUSCULAR | Status: DC | PRN
Start: 2020-04-30 — End: 2020-04-30
  Administered 2020-04-30: 8000 [IU] via INTRAVENOUS

## 2020-04-30 MED ORDER — FUROSEMIDE 10 MG/ML IJ SOLN
100.0000 mg | Freq: Once | INTRAVENOUS | Status: AC
  Administered 2020-04-30: 100 mg via INTRAVENOUS
  Filled 2020-04-30: qty 10

## 2020-04-30 MED ORDER — SODIUM CHLORIDE 0.9 % FOR CRRT: INTRAVENOUS_CENTRAL | Status: DC | PRN

## 2020-04-30 MED ORDER — FENTANYL CITRATE (PF) 100 MCG/2ML IJ SOLN
25.0000 ug | INTRAMUSCULAR | Status: DC | PRN
  Administered 2020-05-01 – 2020-05-02 (×8): 100 ug via INTRAVENOUS
  Administered 2020-05-02: 50 ug via INTRAVENOUS
  Filled 2020-04-30 (×9): qty 2

## 2020-04-30 MED ORDER — CHLORHEXIDINE GLUCONATE CLOTH 2 % EX PADS: 6.0000 | MEDICATED_PAD | Freq: Every day | CUTANEOUS | Status: DC

## 2020-04-30 MED ORDER — PHENYLEPHRINE HCL-NACL 10-0.9 MG/250ML-% IV SOLN
INTRAVENOUS | Status: DC | PRN
  Administered 2020-04-30: 50 ug/min via INTRAVENOUS

## 2020-04-30 MED ORDER — POLYETHYLENE GLYCOL 3350 17 G PO PACK: 17.0000 g | PACK | Freq: Every day | ORAL | Status: DC

## 2020-04-30 MED ORDER — CALCIUM GLUCONATE 10 % IV SOLN
INTRAVENOUS | Status: AC
  Administered 2020-04-30: 4.65 meq
  Filled 2020-04-30: qty 10

## 2020-04-30 MED ORDER — VASOPRESSIN 20 UNIT/ML IV SOLN
INTRAVENOUS | Status: AC
  Filled 2020-04-30: qty 1

## 2020-04-30 MED ORDER — PHENYLEPHRINE 40 MCG/ML (10ML) SYRINGE FOR IV PUSH (FOR BLOOD PRESSURE SUPPORT)
PREFILLED_SYRINGE | INTRAVENOUS | Status: DC | PRN
  Administered 2020-04-30: 80 ug via INTRAVENOUS
  Administered 2020-04-30: 160 ug via INTRAVENOUS

## 2020-04-30 MED ORDER — LACTATED RINGERS IV BOLUS: 500.0000 mL | Freq: Once | INTRAVENOUS | Status: DC

## 2020-04-30 MED ORDER — FENTANYL CITRATE (PF) 100 MCG/2ML IJ SOLN: 25.0000 ug | INTRAMUSCULAR | Status: DC | PRN

## 2020-04-30 MED ORDER — SODIUM BICARBONATE-DEXTROSE 150-5 MEQ/L-% IV SOLN
150.0000 meq | INTRAVENOUS | Status: DC
  Filled 2020-04-30: qty 1000

## 2020-04-30 MED ORDER — SODIUM CHLORIDE 0.9 % IV SOLN
INTRAVENOUS | Status: AC
  Filled 2020-04-30: qty 1.2

## 2020-04-30 MED ORDER — SUGAMMADEX SODIUM 200 MG/2ML IV SOLN
INTRAVENOUS | Status: DC | PRN
  Administered 2020-04-30: 200 mg via INTRAVENOUS

## 2020-04-30 MED ORDER — ALBUMIN HUMAN 5 % IV SOLN: INTRAVENOUS | Status: DC | PRN

## 2020-04-30 MED ORDER — PROTAMINE SULFATE 10 MG/ML IV SOLN
INTRAVENOUS | Status: DC | PRN
Start: 2020-04-30 — End: 2020-04-30
  Administered 2020-04-30: 50 mg via INTRAVENOUS

## 2020-04-30 MED ORDER — SODIUM BICARBONATE 8.4 % IV SOLN
100.0000 meq | Freq: Once | INTRAVENOUS | Status: AC
  Administered 2020-04-30: 100 meq via INTRAVENOUS

## 2020-04-30 MED ORDER — SODIUM CHLORIDE 0.9 % IV SOLN
INTRAVENOUS | Status: DC | PRN
  Administered 2020-04-30: 500 mL

## 2020-04-30 MED ORDER — CEFAZOLIN SODIUM-DEXTROSE 2-4 GM/100ML-% IV SOLN
INTRAVENOUS | Status: AC
  Filled 2020-04-30: qty 100

## 2020-04-30 MED ORDER — PRISMASOL BGK 0/2.5 32-2.5 MEQ/L REPLACEMENT SOLN
Status: DC
  Filled 2020-04-30 (×3): qty 5000

## 2020-04-30 MED ORDER — POLYETHYLENE GLYCOL 3350 17 G PO PACK: 17.0000 g | PACK | Freq: Every day | ORAL | Status: DC | PRN

## 2020-04-30 MED ORDER — ONDANSETRON HCL 4 MG/2ML IJ SOLN: 4.0000 mg | Freq: Four times a day (QID) | INTRAMUSCULAR | Status: DC | PRN

## 2020-04-30 MED ORDER — DEXTROSE 50 % IV SOLN: 1.0000 | Freq: Once | INTRAVENOUS | Status: DC

## 2020-04-30 MED ORDER — HEPARIN SODIUM (PORCINE) 1000 UNIT/ML DIALYSIS
1000.0000 [IU] | INTRAMUSCULAR | Status: DC | PRN
  Administered 2020-05-01 – 2020-05-03 (×2): 2800 [IU] via INTRAVENOUS_CENTRAL
  Administered 2020-05-04 (×2): 3000 [IU] via INTRAVENOUS_CENTRAL
  Filled 2020-04-30: qty 6
  Filled 2020-04-30: qty 3
  Filled 2020-04-30 (×4): qty 6
  Filled 2020-04-30: qty 3

## 2020-04-30 MED ORDER — ALBUTEROL SULFATE (2.5 MG/3ML) 0.083% IN NEBU
10.0000 mg | INHALATION_SOLUTION | Freq: Once | RESPIRATORY_TRACT | Status: AC
  Administered 2020-04-30: 10 mg via RESPIRATORY_TRACT
  Filled 2020-04-30: qty 12

## 2020-04-30 MED ORDER — INSULIN ASPART 100 UNIT/ML ~~LOC~~ SOLN
SUBCUTANEOUS | Status: AC
  Administered 2020-04-30: 10 [IU] via INTRAVENOUS
  Filled 2020-04-30: qty 1

## 2020-04-30 MED ORDER — PRISMASOL BGK 0/2.5 32-2.5 MEQ/L IV SOLN
INTRAVENOUS | Status: DC
  Filled 2020-04-30 (×25): qty 5000

## 2020-04-30 MED ORDER — INSULIN ASPART 100 UNIT/ML IV SOLN: 10.0000 [IU] | Freq: Once | INTRAVENOUS | Status: AC

## 2020-04-30 MED ORDER — OXYCODONE HCL 5 MG PO TABS: 5.0000 mg | ORAL_TABLET | Freq: Once | ORAL | Status: DC | PRN

## 2020-04-30 MED ORDER — CEFAZOLIN SODIUM-DEXTROSE 2-3 GM-%(50ML) IV SOLR
INTRAVENOUS | Status: DC | PRN
  Administered 2020-04-30: 2 g via INTRAVENOUS

## 2020-04-30 MED ORDER — SODIUM BICARBONATE 8.4 % IV SOLN: 50.0000 meq | Freq: Once | INTRAVENOUS | Status: DC

## 2020-04-30 MED ORDER — HEPARIN SODIUM (PORCINE) 1000 UNIT/ML IJ SOLN
INTRAMUSCULAR | Status: AC
  Filled 2020-04-30: qty 1

## 2020-04-30 MED ORDER — DEXTROSE 50 % IV SOLN
1.0000 | Freq: Once | INTRAVENOUS | Status: AC
  Administered 2020-04-30: 50 mL via INTRAVENOUS
  Filled 2020-04-30: qty 50

## 2020-04-30 MED ORDER — PHENYLEPHRINE HCL (PRESSORS) 10 MG/ML IV SOLN
INTRAVENOUS | Status: AC
  Filled 2020-04-30: qty 1

## 2020-04-30 MED ORDER — HEPARIN SOD (PORK) LOCK FLUSH 100 UNIT/ML IV SOLN
INTRAVENOUS | Status: AC
  Filled 2020-04-30: qty 5

## 2020-04-30 MED ORDER — SODIUM CHLORIDE 0.9 % IV SOLN
1.0000 g | Freq: Once | INTRAVENOUS | Status: AC
  Administered 2020-05-01: 1 g via INTRAVENOUS
  Filled 2020-04-30: qty 10

## 2020-04-30 MED ORDER — PROPOFOL 1000 MG/100ML IV EMUL
0.0000 ug/kg/min | INTRAVENOUS | Status: DC
  Administered 2020-05-01: 10 ug/kg/min via INTRAVENOUS
  Filled 2020-04-30: qty 100

## 2020-04-30 MED ORDER — ALBUTEROL SULFATE (2.5 MG/3ML) 0.083% IN NEBU: 10.0000 mg | INHALATION_SOLUTION | Freq: Once | RESPIRATORY_TRACT | Status: DC

## 2020-04-30 MED ORDER — SODIUM CHLORIDE 0.9 % IV SOLN: INTRAVENOUS | Status: DC | PRN

## 2020-04-30 MED ORDER — SUCCINYLCHOLINE CHLORIDE 200 MG/10ML IV SOSY
PREFILLED_SYRINGE | INTRAVENOUS | Status: AC
  Filled 2020-04-30: qty 10

## 2020-04-30 MED ORDER — DOCUSATE SODIUM 50 MG/5ML PO LIQD
100.0000 mg | Freq: Two times a day (BID) | ORAL | Status: DC
  Administered 2020-05-01: 100 mg via ORAL
  Filled 2020-04-30: qty 10

## 2020-04-30 MED ORDER — SODIUM POLYSTYRENE SULFONATE 15 GM/60ML PO SUSP: 45.0000 g | Freq: Once | ORAL | Status: DC

## 2020-04-30 MED ORDER — ROCURONIUM BROMIDE 10 MG/ML (PF) SYRINGE
PREFILLED_SYRINGE | INTRAVENOUS | Status: DC | PRN
  Administered 2020-04-30: 50 mg via INTRAVENOUS

## 2020-04-30 MED ORDER — INSULIN ASPART 100 UNIT/ML IV SOLN
5.0000 [IU] | Freq: Once | INTRAVENOUS | Status: DC
  Filled 2020-04-30: qty 0.05

## 2020-04-30 MED ORDER — PROPOFOL 10 MG/ML IV BOLUS
100.0000 mg | Freq: Once | INTRAVENOUS | Status: DC
  Filled 2020-04-30: qty 20

## 2020-04-30 SURGICAL SUPPLY — 72 items
BANDAGE ESMARK 6X9 LF (GAUZE/BANDAGES/DRESSINGS) IMPLANT
BNDG ELASTIC 4X5.8 VLCR STR LF (GAUZE/BANDAGES/DRESSINGS) IMPLANT
BNDG ELASTIC 6X15 VLCR STRL LF (GAUZE/BANDAGES/DRESSINGS) ×2 IMPLANT
BNDG ESMARK 6X9 LF (GAUZE/BANDAGES/DRESSINGS)
BNDG GAUZE ELAST 4 BULKY (GAUZE/BANDAGES/DRESSINGS) ×2 IMPLANT
CANISTER SUCT 3000ML PPV (MISCELLANEOUS) ×2 IMPLANT
CANNULA VESSEL 3MM 2 BLNT TIP (CANNULA) IMPLANT
CATH EMB 2FR 60CM (CATHETERS) ×2 IMPLANT
CATH EMB 3FR 40CM (CATHETERS) ×2 IMPLANT
CATH EMB 3FR 80CM (CATHETERS) IMPLANT
CATH EMB 4FR 80CM (CATHETERS) IMPLANT
CATH EMB 5FR 80CM (CATHETERS) IMPLANT
CATH TRIALYSIS 20CM 13F 3LUM (CATHETERS) ×2 IMPLANT
CLIP VESOCCLUDE MED 24/CT (CLIP) ×2 IMPLANT
CLIP VESOCCLUDE SM WIDE 24/CT (CLIP) ×2 IMPLANT
COVER WAND RF STERILE (DRAPES) IMPLANT
CUFF TOURN SGL QUICK 24 (TOURNIQUET CUFF)
CUFF TOURN SGL QUICK 34 (TOURNIQUET CUFF)
CUFF TOURN SGL QUICK 42 (TOURNIQUET CUFF) IMPLANT
CUFF TRNQT CYL 24X4X16.5-23 (TOURNIQUET CUFF) IMPLANT
CUFF TRNQT CYL 34X4.125X (TOURNIQUET CUFF) IMPLANT
DERMABOND ADVANCED (GAUZE/BANDAGES/DRESSINGS)
DERMABOND ADVANCED .7 DNX12 (GAUZE/BANDAGES/DRESSINGS) IMPLANT
DRAIN CHANNEL 15F RND FF W/TCR (WOUND CARE) IMPLANT
DRAPE HALF SHEET 40X57 (DRAPES) IMPLANT
DRAPE X-RAY CASS 24X20 (DRAPES) IMPLANT
ELECT REM PT RETURN 9FT ADLT (ELECTROSURGICAL) ×2
ELECTRODE REM PT RTRN 9FT ADLT (ELECTROSURGICAL) ×1 IMPLANT
EVACUATOR SILICONE 100CC (DRAIN) IMPLANT
GAUZE SPONGE 4X4 12PLY STRL LF (GAUZE/BANDAGES/DRESSINGS) ×2 IMPLANT
GLOVE BIO SURGEON STRL SZ7 (GLOVE) ×2 IMPLANT
GLOVE BIO SURGEON STRL SZ7.5 (GLOVE) ×4 IMPLANT
GLOVE BIOGEL PI IND STRL 6.5 (GLOVE) ×1 IMPLANT
GLOVE BIOGEL PI IND STRL 7.0 (GLOVE) ×2 IMPLANT
GLOVE BIOGEL PI INDICATOR 6.5 (GLOVE) ×1
GLOVE BIOGEL PI INDICATOR 7.0 (GLOVE) ×2
GOWN STRL REUS W/ TWL LRG LVL3 (GOWN DISPOSABLE) ×2 IMPLANT
GOWN STRL REUS W/ TWL XL LVL3 (GOWN DISPOSABLE) ×2 IMPLANT
GOWN STRL REUS W/TWL LRG LVL3 (GOWN DISPOSABLE) ×4
GOWN STRL REUS W/TWL XL LVL3 (GOWN DISPOSABLE) ×4
HEMOSTAT SNOW SURGICEL 2X4 (HEMOSTASIS) ×2 IMPLANT
INSERT FOGARTY SM (MISCELLANEOUS) IMPLANT
KIT BASIN OR (CUSTOM PROCEDURE TRAY) ×2 IMPLANT
KIT TURNOVER KIT B (KITS) ×2 IMPLANT
MARKER GRAFT CORONARY BYPASS (MISCELLANEOUS) IMPLANT
NS IRRIG 1000ML POUR BTL (IV SOLUTION) ×2 IMPLANT
PACK PERIPHERAL VASCULAR (CUSTOM PROCEDURE TRAY) ×2 IMPLANT
PAD ABD 8X10 STRL (GAUZE/BANDAGES/DRESSINGS) ×2 IMPLANT
PAD ARMBOARD 7.5X6 YLW CONV (MISCELLANEOUS) ×4 IMPLANT
SET COLLECT BLD 21X3/4 12 (NEEDLE) IMPLANT
STOPCOCK 4 WAY LG BORE MALE ST (IV SETS) IMPLANT
SUT ETHILON 3 0 PS 1 (SUTURE) ×4 IMPLANT
SUT GORETEX 6.0 TT13 (SUTURE) IMPLANT
SUT GORETEX 6.0 TT9 (SUTURE) IMPLANT
SUT MNCRL AB 4-0 PS2 18 (SUTURE) ×2 IMPLANT
SUT PROLENE 5 0 C 1 24 (SUTURE) ×6 IMPLANT
SUT PROLENE 6 0 BV (SUTURE) ×10 IMPLANT
SUT PROLENE 7 0 BV 1 (SUTURE) IMPLANT
SUT SILK 2 0 SH (SUTURE) IMPLANT
SUT SILK 3 0 (SUTURE)
SUT SILK 3-0 18XBRD TIE 12 (SUTURE) IMPLANT
SUT VIC AB 2-0 CT1 27 (SUTURE) ×2
SUT VIC AB 2-0 CT1 TAPERPNT 27 (SUTURE) ×1 IMPLANT
SUT VIC AB 3-0 SH 27 (SUTURE) ×2
SUT VIC AB 3-0 SH 27X BRD (SUTURE) ×1 IMPLANT
SYR 3ML LL SCALE MARK (SYRINGE) ×2 IMPLANT
TAPE UMBILICAL COTTON 1/8X30 (MISCELLANEOUS) IMPLANT
TOWEL GREEN STERILE (TOWEL DISPOSABLE) ×4 IMPLANT
TRAY FOLEY MTR SLVR 16FR STAT (SET/KITS/TRAYS/PACK) IMPLANT
TUBING EXTENTION W/L.L. (IV SETS) IMPLANT
UNDERPAD 30X36 HEAVY ABSORB (UNDERPADS AND DIAPERS) ×6 IMPLANT
WATER STERILE IRR 1000ML POUR (IV SOLUTION) ×2 IMPLANT

## 2020-04-30 NOTE — Progress Notes (Signed)
° °  Subjective:  [name] is a 64 y.o. with PMH of HTn, CKD3a, Restless leg admit for rhabdomyolysis on hospital day 2  [name] was examined and evaluated at bedside this am. She mentions having lower extremity leg pains that feels like 'muscle cramps or pulled tendon' Also continuing to endorse right lower extremity numbness. States some of her blisters oozed overnight but otherwise has no acute complaints. Denies any dyspnea, orthopnea or cough  Objective:  Vital signs in last 24 hours: Vitals:   05/09/2020 0400 04/11/2020 0500 04/17/2020 0600 05/04/2020 0700  BP: 125/78 136/71 (!) 142/96   Pulse: 86 85 86   Resp:  18  18  Temp:  97.7 F (36.5 C)  (!) 97.4 F (36.3 C)  TempSrc:  Oral  Oral  SpO2: 96% 98% 97%   Weight:      Height:       Gen: Well-developed, well nourished, NAD HEENT: NCAT head, hearing intact, R artificial eye CV: RRR, S1, S2 normal, No rubs, no murmurs, no gallops Pulm: CTAB, No rales, no wheezes  Abd: Soft, BS+, NTND, No rebound, no guarding Extm: ROM intact, Peripheral pulses intact, 2+ bilateral lower extremity edema. Skin: Dry, Warm, normal turgor, continued persistent clear bullae bilateral lower extremities. Neuro: AAOx3, 2/5 RLE weakness, 3/5 LLE weakness, RLE numbness to fine touch  Assessment/Plan:  Active Problems:   Rhabdomyolysis  [name] is a 64 y.o. with PMH of HTn, CKD3a, Restless leg admit for rhabdomyolysis after prolonged immobilization due to fall.  Rhabdomyolysis AKI on CKD III Hyperkalemia Patient admitted with rhabdomyolysis after being found down for 3-4 hours. Received IV fluid boluses in ED and on admission (~6L). Appears more volume overloaded today. Continues to be anuric concerning for renal failure. Likely will need temporary dialysis. Await nephro recs - Appreciate nephro recs: Observation and supportive care off fluids, treat K - Trend CK and BMP - C/w Lokelma 10mg  TID - Telemetry  Leukocytosis No obvious  sign of infection. Wbc 28->27. Possibly due to acute illness. - Continue to monitor  - CBC daily   Bilateral lower extremity weakness 2/2 deconditioning vs myolysis  Patient with continued bilateral lower extremity weakness, although slightly improved from prior. CT head and MRI brain negative for acute CVA. Suspect her weakness in setting of deconditioning vs myolysis. 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 Negative nikolsky sign. - Continue to monitor   DVT prophx: subqhep Diet: Cardiac/DM Bowel: Senokot Code: Full  Prior to Admission Living Arrangement: Home Anticipated Discharge Location: SNF Barriers to Discharge: Medical treatment Dispo: Anticipated discharge in approximately 2-3 day(s).   Brittany Anis, MD 05/07/2020, 8:48 AM Pager: [phone] After 5pm on weekdays and 1pm on weekends: On Call Pager: [phone]

## 2020-04-30 NOTE — Progress Notes (Addendum)
Paged to PACU for patient developing difficulty breathing post-extubation. She is s/p RLE thrombembolectomy of popliteal, anterior tibial and posterior tibial arteries, as well as 4 compartment fasciotomies. Initially she was doing well and communicative, but subsequently became more dyspneic and unresponsive. Patient was on BiPAP when we arrived. She was persistently hypoxic and appeared toxic. Anesthesia was at bedside and agreed that she needed to be re-intubated. Greatly appreciate their assistance. She also became hypotensive and required initiation of phenylephrine drip. Estimated blood loss intraoperatively was 1000 cc. Post-op H&H returned at 4.2. Will emergently transfuse.   PCCM was consulted for transfer to the ICU.   Attempted to reach family member listed in contact list to update but there was no answer and no voicemail set up.   Modena Nunnery D, DO PGY-2 IMTS 04/29/2020, 10:30 PM

## 2020-04-30 NOTE — Transfer of Care (Signed)
Immediate Anesthesia Transfer of Care Note  Patient: [name]  Procedure(s) Performed: THROMBECTOMY RIGHT POPLITEAL ARTERY; RIGHT ANTERIOR TIBIAL ARTERY AND RIGHT POSTERIOR TIBIAL ARTERY; RIGHT LOWER EXTRMEMITY MEDIAL AND LATERAL FOUR COMPARTMENT FASCIOTOMITIES (Right )  Patient Location: PACU  Anesthesia Type:General  Level of Consciousness: awake, oriented and patient cooperative  Airway & Oxygen Therapy: Patient Spontanous Breathing and Patient connected to face mask oxygen  Post-op Assessment: Report given to RN, Post -op Vital signs reviewed and stable and Patient moving all extremities X 4  Post vital signs: Reviewed and stable  Last Vitals:  Vitals Value Taken Time  BP 96/74 05/05/2020 2116  Temp    Pulse 31 04/21/2020 2126  Resp 28 05/02/2020 2126  SpO2 99 % 05/09/2020 2126  Vitals shown include unvalidated device data.  Last Pain:  Vitals:   05/04/2020 1743  TempSrc: Oral  PainSc:       Patients Stated Pain Goal: 2 (28/36/62 9476)  Complications: No complications documented.

## 2020-04-30 NOTE — Progress Notes (Addendum)
On re-assessment, after giving patient a bath and changing her position, it was found that her right foot had limited movement and numbness had increased.  Foot was very cool to touch and mottled.  Pedal dorsalis pulse easily dopplered in left foot, but I was unable to doppler any pulses in her right foot.  Decreased movement in right leg / foot.  Experiencing increased pain whenever moved and does not tolerate head position less than 30 degrees.  MD notified.

## 2020-04-30 NOTE — Anesthesia Procedure Notes (Signed)
Procedure Name: Intubation Date/Time: 05/04/2020 10:00 PM Performed by: Claris Che, CRNA Pre-anesthesia Checklist: Patient identified, Emergency Drugs available, Suction available, Patient being monitored and Timeout performed Patient Re-evaluated:Patient Re-evaluated prior to induction Oxygen Delivery Method: Ambu bag Preoxygenation: Pre-oxygenation with 100% oxygen Induction Type: IV induction, Cricoid Pressure applied and Rapid sequence Ventilation: Mask ventilation without difficulty Laryngoscope Size: Mac and 4 Grade View: Grade II Tube type: Oral Tube size: 8.0 mm Number of attempts: 1 Airway Equipment and Method: Stylet Placement Confirmation: ETT inserted through vocal cords under direct vision,  CO2 detector and breath sounds checked- equal and bilateral Secured at: 23 cm Tube secured with: Tape Dental Injury: Teeth and Oropharynx as per pre-operative assessment

## 2020-04-30 NOTE — Progress Notes (Signed)
Foley inserted successfully after three attempts by nursing.  Felt to be in position, but no urine output noted.  MD is aware.  [place] of right leg being done by MD.

## 2020-04-30 NOTE — Progress Notes (Signed)
ANTICOAGULATION CONSULT NOTE - Initial Consult  Pharmacy Consult for heparin Indication: limb ischemia  Allergies  Allergen Reactions  . Olmesartan Other (See Comments)    GFR drop  . Requip [Ropinirole] Other (See Comments)    Overactive bladder  . Valsartan Other (See Comments)    GFR drop    Patient Measurements: Height: 5\' 2"  (157.5 cm) Weight: 88.5 kg (195 lb 3.2 oz) IBW/kg (Calculated) : 50.1 Heparin Dosing Weight: 88kg  Vital Signs: Temp: 97.8 F (36.6 C) (06/20 1743) Temp Source: Oral (06/20 1743) BP: 136/84 (06/20 1400) Pulse Rate: 96 (06/20 1400)  Labs: Recent Labs    04/27/2020 1139 04/29/2020 1139 05/03/2020 1816 04/12/2020 1933 04/29/20 0215 04/29/20 1027 04/29/20 1744 04/26/2020 0233 04/29/2020 1047 04/18/2020 1214  HGB 15.1*   < > 17.3*   < > 13.8  --   --   --  11.0*  --   HCT 48.6*   < > 51.0*  --  43.1  --   --   --  34.6*  --   PLT 352  --   --   --  275  --   --   --  236  --   CREATININE 1.95*  --   --    < > 3.32*   < > 4.37* 5.27*  --  6.09*  CKTOTAL 49,279*  --   --    < > >50,000*  --  >50,000* >50,000*  --   --    < > = values in this interval not displayed.    Estimated Creatinine Clearance: 9.5 mL/min (A) (by C-G formula based on SCr of 6.09 mg/dL (H)).   Medical History: Past Medical History:  Diagnosis Date  . Balance disorder   . Hypertension   . Renal disorder    Chronic kidney disease     Assessment: 62 yoF admitted with rhabdomyolysis now with R ischemic foot, pharmacy to start IV heparin. No AC PTA, pt only receiving prophylactic SQ heparin during admission (last dose at 1728). Will give small bolus only given recent SQ dose. CBC wnl this am.  Goal of Therapy:  Heparin level 0.3-0.7 units/ml Monitor platelets by anticoagulation protocol: Yes   Plan:  -Heparin 2000 unit bolus -Heparin 1400 units/h -Check 8hr heparin level   Arrie Senate, PharmD, BCPS Clinical Pharmacist [phone] Please check AMION for all [facility]  numbers 04/29/2020

## 2020-04-30 NOTE — Progress Notes (Addendum)
Patient has remained drowsy throughout the shift. She is easily arousable. She fell asleep once during our conversing. She has a purewick but has not voided during my 11pm-7am shift. Only 85 ml was noted when bladder scanned. Notified covering MD via [place].

## 2020-04-30 NOTE — Anesthesia Procedure Notes (Signed)
Arterial Line Insertion Start/End06/29/2021 10:05 PM, 04/26/2020 10:15 PM Performed by: Albertha Ghee, MD, anesthesiologist  Patient location: Pre-op. Preanesthetic checklist: patient identified, IV checked, site marked, risks and benefits discussed, surgical consent, monitors and equipment checked, pre-op evaluation, timeout performed and anesthesia consent Right, brachial was placed Catheter size: 20 Fr Hand hygiene performed  and maximum sterile barriers used   Attempts: 1 Procedure performed using ultrasound guided technique. Ultrasound Notes:anatomy identified, needle tip was noted to be adjacent to the nerve/plexus identified and no ultrasound evidence of intravascular and/or intraneural injection Following insertion, dressing applied, line sutured and Biopatch. Post procedure assessment: normal and unchanged  Patient tolerated the procedure well with no immediate complications.

## 2020-04-30 NOTE — Progress Notes (Signed)
CCM at bedside with anesthesia. Pt not responding to BIPAP and sats. Plan to intubate.

## 2020-04-30 NOTE — Anesthesia Preprocedure Evaluation (Addendum)
Anesthesia Evaluation  Patient identified by MRN, date of birth, ID band Patient awake    Reviewed: Allergy & Precautions, H&P , NPO status , Patient's Chart, lab work & pertinent test results  Airway Mallampati: II   Neck ROM: full    Dental   Pulmonary neg pulmonary ROS,    breath sounds clear to auscultation       Cardiovascular hypertension, + Peripheral Vascular Disease   Rhythm:regular Rate:Normal  RLE ischemia   Neuro/Psych  Neuromuscular disease    GI/Hepatic   Endo/Other  obese  Renal/GU ARFRenal diseaseK 6.3 today at 12:14.  Lokelma 10g given at 5pm. 10 Units insulin(&glucose) given 5:20pm.       Musculoskeletal   Abdominal   Peds  Hematology   Anesthesia Other Findings   Reproductive/Obstetrics                            Anesthesia Physical Anesthesia Plan  ASA: III and emergent  Anesthesia Plan: General   Post-op Pain Management:    Induction: Intravenous  PONV Risk Score and Plan: 3 and Ondansetron, Dexamethasone and Treatment may vary due to age or medical condition  Airway Management Planned: Oral ETT  Additional Equipment: Arterial line  Intra-op Plan:   Post-operative Plan: Extubation in OR  Informed Consent: I have reviewed the patients History and Physical, chart, labs and discussed the procedure including the risks, benefits and alternatives for the proposed anesthesia with the patient or authorized representative who has indicated his/her understanding and acceptance.       Plan Discussed with: CRNA, Anesthesiologist and Surgeon  Anesthesia Plan Comments:        Anesthesia Quick Evaluation

## 2020-04-30 NOTE — Progress Notes (Signed)
[facility] Progress Note  Subjective: no new c/o's, no UOP , creat up 5.2 this am, K 5.2  Vitals:   05/08/2020 1100 04/29/2020 1200 05/02/2020 1400 04/14/2020 1433  BP:  (!) 114/97 136/84   Pulse:  94 96   Resp:      Temp: 98.1 F (36.7 C)     TempSrc: Oral     SpO2:  100% 100% 100%  Weight:      Height:        Exam: Gen alert, deconditioned appearance No jvd or bruits Chest clear bilat to bases RRR no MRG Abd soft ntnd no mass or ascites +bs Ext 1-2+ bilat hip and pretib LE > UE edema w/ blistering Neuro is alert, Ox 3 , nf    Home meds:  - norvasc 10 qd  - neurontin 200 lunch+ 100 hs    in Care everywhere on  01/11/20 creat = 1.35 , eGFR 42 ml/min  UA 6/18 - large Hb, 5 ketone, 100 prot, rare bact, 0-5 wbc/ rbc  CXR 6/18 - no acute abnormality REnal US - 10 cm x 2 , no hydro  Assessment/ Plan: 1. AoCKD 3- b/l creat 1.3 in March 2021, eGFR 46.  Was on the floor for undetermined amount of time at home yesterday and presenting w/ AKI , oliguric w/ CPK > 50,000 indicating significant rhabdomyolysis.  Renal US w/o hydro.  Creat up to 5.2 this am.  No uremic symptoms. K 5.2, better. Follow closely. Will likely need HD in 24-48 hrs if not improving.  2. Acute rhabdomyolysis - supportive care 3. Hyperkalemia - renal diet, cont lokelma tid for now 4. HTN - cont home norvasc, prn IV meds 5. Vol overload - gave IV lasix x 1 w/o uop 6. RLS - on gabapentin at home at low dose, not getting here  [name] [place] 04/11/2020, 5:18 PM   Recent Labs  Lab 04/29/20 0215 04/29/20 1027 05/07/2020 0233 05/05/2020 1047 04/29/2020 1214  K 5.5*  5.1   < > 5.2*  --  6.3*  BUN 58*   < > 79*  --  88*  CREATININE 3.32*   < > 5.27*  --  6.09*  CALCIUM 6.7*   < > 6.5*  --  6.5*  PHOS  --   --  9.7*  --   --   HGB 13.8  --   --  11.0*  --    < > = values in this interval not displayed.   Inpatient medications: . insulin aspart  10 Units Intravenous Once   And  . dextrose  1  ampule Intravenous Once  . heparin  5,000 Units Subcutaneous Q8H  . insulin aspart  0-15 Units Subcutaneous TID WC  . sodium zirconium cyclosilicate  10 g Oral TID   . furosemide     acetaminophen **OR** acetaminophen, senna-docusate

## 2020-04-30 NOTE — Progress Notes (Signed)
Seen in PACU post-op.  On bipap for poor resp compensation, was going to be intubated by anesthesia. CXR in OR was clear, suspect pt just tired out.  Post op K+ just came back at 7.3, have ordered temporizing measures (Ca, ins/glu, bicarb) and will plan for CRRT w/ low K+ fluids when pt gets to ICU.   Kelly Splinter, MD 04/15/2020, 10:48 PM

## 2020-04-30 NOTE — OR Nursing (Signed)
Power Trialysis Dialysis Catheter inserted Right Internal Jugular by Dr. Marcie Bal intra-op immediately after induction.  LOT # [id number], Expiration  10-31/2021.

## 2020-04-30 NOTE — Progress Notes (Signed)
Call placed to Dr. Mechele Dawley for potassium of 6.3.  Awaiting return call.

## 2020-04-30 NOTE — Anesthesia Procedure Notes (Signed)
Central Venous Catheter Insertion Performed by: Albertha Ghee, MD, anesthesiologist Start/End06/19/2021 7:33 PM, 04/14/2020 7:45 PM Patient location: OR. Preanesthetic checklist: patient identified, IV checked, site marked, risks and benefits discussed, surgical consent, monitors and equipment checked, pre-op evaluation, timeout performed and anesthesia consent Position: Trendelenburg Patient sedated Hand hygiene performed  and maximum sterile barriers used  Catheter size: 12 Fr Central line was placed.Triple lumen Procedure performed using ultrasound guided technique. Ultrasound Notes:anatomy identified, needle tip was noted to be adjacent to the nerve/plexus identified, no ultrasound evidence of intravascular and/or intraneural injection and image(s) printed for medical record Attempts: 1 Following insertion, line sutured and dressing applied. Post procedure assessment: blood return through all ports, free fluid flow and no air  Patient tolerated the procedure well with no immediate complications. Additional procedure comments: This was a trialysis catheter placed upon the surgeon's request.  Pt was under GA during procedure.  No complications.Marland Kitchen

## 2020-04-30 NOTE — Op Note (Signed)
Patient name: [name] MRN: [id number] DOB: 17-Apr-1956 Sex: female  05/05/2020 Pre-operative Diagnosis: Acute extremity ischemia, right lower extremity compartment syndrome Post-operative diagnosis:  Same Surgeon:  Eda Paschal. Donzetta Matters, MD Assistant: Arlee Muslim, PA Procedure Performed: 1.  Right lower extremity thromboembolectomy of popliteal, anterior tibial and posterior tibial arteries 2.  Right lower extremity 4 compartment fasciotomies  Indications: 64 year old female was found down 2 days prior to this procedure was found to have rhabdomyolysis and today was found to have acute right lower extremity ischemia with a mottled foot.  She is now indicated for right lower extremity thromboembolectomy for compartment fasciotomies.  Findings: Patient had a palpable pulse in the popliteal artery.  Acute return of clot either proximally or distally from the popliteal artery.  After that we had no signal in the posterior tibial artery he did have a high resistance signal in the anterior tibial artery at the ankle.  After thromboembolectomy of the posterior tibial artery we did have a very strong inflow he did not get any clot distally from the foot and the outflow at a high resistance signal.  After thromboembolectomy of the anterior tibial artery at the ankle we did have good inflow again had no backbleeding and it again had high resistance signal.  Compartments were very tight the anterior lateral compartments the muscle does not appear viable did not react to cautery and the posterior compartments both the deep and superficial were tight.  Soleus muscle does not react gastrocnemius muscle minimally reactive to cautery.  Patient will likely require amputation during this hospitalization.   Procedure:  The patient was identified in the holding area and taken to the operating room where she was laid supine operative general anesthesia induced.  Anesthesia had first placed to dialysis catheter for  dialysis.  Antibiotics were minister timeout was called.  We began by opening her posterior compartments with a longitudinal incision along the medial aspect of the leg.  They were under very tight pressure.  Both superficial and deep compartments had severe bulging of the muscle.  Soleus  muscle was minimally reactive to cautery as is the gastrocnemius muscle.  The deep compartment in the posterior muscle was mostly nonreactive.  Laterally we performed a similar longitudinal incision.  We open the anterior and lateral compartments.  Muscle did not appear by wound any compartments.  Medially we dissected down to the popliteal artery.  This had a pulse in it.  We attempted dissecting the anterior tibial artery with the muscle was too large.  We did dissected out and divided the medial popliteal vein for better exposure.  Popliteal artery was soft.  Patient was fully heparinized.  We performed arteriotomy.  We clamped the inflow.  We then passed a 3 Fogarty proximally up to the level of the common femoral artery no clot was returned.  We then passed distally on 2 separate occasions no clot was returned.  I clamped the distal artery was of some backbleeding prior to this.  We closed the artery with 6-0 Prolene suture.  We then turned our attention distally.  Already dissected out the deep compartment.  We dissected through the posterior tibial artery placed a vessel loop around this.  It had no pulse.  We made a transverse arteriotomy there is no bleeding.  We passed a 2 Fogarty proximally greater than 50 cm were able to establish inflow.  We then clamped the artery.  We passed Fogarty distally greater than 30 cm we got  no backbleeding.  We did this on 2 occasions.  We then flushed with heparinized saline and closed the arteriotomy with 6-0 Prolene suture.  We then had very strong inflow the signal was high resistance consistent with outflow issue.  Turned our attention the anterior tibial artery.  We dissected out in  the anterior compartment to our fasciotomy incision.  Placed a vessel loop around the anterior tibial artery a similar arteriotomy was performed.  We passed a Fogarty proximally we establish a very strong inflow.  We passed it distally again no clot was returned there is very minimal backbleeding.  The arteries clamped and closed with 6-0 Prolene suture.  Again we had very strong inflow was palpable but there was a high resistance signal in the outflow.  50 mg of protamine was administered.  We irrigated the wound obtain hemostasis.  We then packed with wet-to-dry Kerlix and a sterile dressing was placed.  She was then awake from anesthesia having tolerated procedure without any complication but all counts were correct at completion.  EBL: 1 L   Kariem Wolfson C. Donzetta Matters, MD [facility] Office: [phone] Pager: [phone]

## 2020-04-30 NOTE — Consult Note (Signed)
Hospital Consult    Reason for Consult:  Acute right lower extremity ischemia Referring Physician:  Dr. Truman Hayward (internal medicine resident) MRN #:  [id number]  History of Present Illness: This is a 64 y.o. female history of hypertension and chronic kidney disease presented with rhabdomyolysis after being down for several hours.  Today she was found to have a mottled right foot with no signals.  Signals were easily identifiable on the left side.  Patient states this has been present since admission.  She has numbness and no motor to the foot.  She does have significant pain in the right leg itself.  She denies any previous vascular history before this.  She is not on any blood thinners other than subcutaneous heparin at this time.  Past Medical History:  Diagnosis Date  . Balance disorder   . Hypertension   . Renal disorder    Chronic kidney disease    Past Surgical History:  Procedure Laterality Date  . CESAREAN SECTION      Allergies  Allergen Reactions  . Olmesartan Other (See Comments)    GFR drop  . Requip [Ropinirole] Other (See Comments)    Overactive bladder  . Valsartan Other (See Comments)    GFR drop    Prior to Admission medications   Medication Sig Start Date End Date Taking? Authorizing Provider  amLODipine (NORVASC) 10 MG tablet Take 10 mg by mouth daily. 04/01/20  Yes [provider]  gabapentin (NEURONTIN) 100 MG capsule Take 100-200 mg by mouth See admin instructions. Take 200 mg by mouth midday and 100 mg two hours before bedtime 04/24/20  Yes [provider]  rOPINIRole (REQUIP) 2 MG tablet Take by mouth. Patient not taking: Reported on 05/10/2020 03/29/20   [provider]    Social History   Socioeconomic History  . Marital status: Unknown    Spouse name: Not on file  . Number of children: Not on file  . Years of education: Not on file  . Highest education level: Not on file  Occupational History  . Not on file  Tobacco Use    . Smoking status: Never Smoker  . Smokeless tobacco: Never Used  Vaping Use  . Vaping Use: Never used  Substance and Sexual Activity  . Alcohol use: Not Currently  . Drug use: Never  . Sexual activity: Not on file  Other Topics Concern  . Not on file  Social History Narrative  . Not on file   Social Determinants of Health   Financial Resource Strain:   . Difficulty of Paying Living Expenses:   Food Insecurity:   . Worried About Charity fundraiser in the Last Year:   . Arboriculturist in the Last Year:   Transportation Needs:   . Film/video editor (Medical):   Marland Kitchen Lack of Transportation (Non-Medical):   Physical Activity:   . Days of Exercise per Week:   . Minutes of Exercise per Session:   Stress:   . Feeling of Stress :   Social Connections:   . Frequency of Communication with Friends and Family:   . Frequency of Social Gatherings with Friends and Family:   . Attends Religious Services:   . Active Member of Clubs or Organizations:   . Attends Archivist Meetings:   Marland Kitchen Marital Status:   Intimate Partner Violence:   . Fear of Current or Ex-Partner:   . Emotionally Abused:   Marland Kitchen Physically Abused:   . Sexually Abused:  History reviewed. No pertinent family history.  ROS:  Cardiovascular: []  chest pain/pressure []  palpitations []  SOB lying flat []  DOE []  pain in legs while walking [x]  pain in legs at rest []  pain in legs at night []  non-healing ulcers []  hx of DVT [x]  swelling in legs  Pulmonary: []  productive cough []  asthma/wheezing []  home O2  Neurologic: []  weakness in []  arms []  legs []  numbness in []  arms []  legs []  hx of CVA []  mini stroke [] difficulty speaking or slurred speech []  temporary loss of vision in one eye []  dizziness  Hematologic: []  hx of cancer []  bleeding problems []  problems with blood clotting easily  Endocrine:   []  diabetes []  thyroid disease  GI []  vomiting blood []  blood in stool  GU: []   CKD/renal failure []  HD--[]  M/W/F or []  T/T/S []  burning with urination []  blood in urine  Psychiatric: []  anxiety []  depression  Musculoskeletal: []  arthritis []  joint pain  Integumentary: [x]  blisters []  ulcers  Constitutional: []  fever []  chills   Physical Examination  Vitals:   04/22/2020 1400 04/23/2020 1433  BP: 136/84   Pulse: 96   Resp:    Temp:    SpO2: 100% 100%   Body mass index is 35.7 kg/m.  General:  nad HENT: WNL, normocephalic Pulmonary: normal non-labored breathing Cardiac: Palpable bilateral radial and popliteal pulses, cannot palpate femoral pulses due to body habitus Left posterior tibial and dorsalis pedis pulses are palpable There is only a weak monophasic anterior tibial signal at the ankle on the right no peroneal or posterior tibial signal was notable and there are no signals in the foot on the right Abdomen:  soft, NT/ND, no masses Extremities: She has bullae on both legs.  Left leg compartments are soft she does have pain with passive motion of the foot do not think she has compartment syndrome of the left lower extremity.  On the right side her right foot is numb and mottled she has no motor sensation.  She does have pain with passive motion in the calf.  All compartments below the knee are tense Neurologic: A&O X 3; Appropriate Affect   CBC    Component Value Date/Time   WBC 20.9 (H) 04/11/2020 1047   RBC 3.88 04/12/2020 1047   HGB 11.0 (L) 04/13/2020 1047   HCT 34.6 (L) 05/10/2020 1047   PLT 236 04/25/2020 1047   MCV 89.2 04/23/2020 1047   MCH 28.4 04/26/2020 1047   MCHC 31.8 04/29/2020 1047   RDW 14.6 05/08/2020 1047   LYMPHSABS 0.3 (L) 04/29/2020 1139   MONOABS 1.7 (H) 05/03/2020 1139   EOSABS 0.0 04/19/2020 1139   BASOSABS 0.0 04/19/2020 1139    BMET    Component Value Date/Time   NA 134 (L) 04/22/2020 1214   K 6.3 (HH) 04/17/2020 1214   CL 105 05/05/2020 1214   CO2 17 (L) 04/18/2020 1214   GLUCOSE 111 (H) 04/24/2020  1214   BUN 88 (H) 05/02/2020 1214   CREATININE 6.09 (H) 05/02/2020 1214   CALCIUM 6.5 (L) 05/05/2020 1214   GFRNONAA 7 (L) 04/26/2020 1214   GFRAA 8 (L) 05/03/2020 1214    COAGS: No results found for: INR, PROTIME  CK > 50K    Non-Invasive Vascular Imaging:   No studies performed  ASSESSMENT/PLAN: This is a 64 y.o. female here with rhabdomyolysis now with mottled right foot does not appear to have any blood flow in the foot.  Etiology unknown but appears to be only  in the right leg below the knee.  I discussed with her the need for urgent right lower extremity thromboembolectomy from a popliteal approach possibly needing exposure of the tibial vessels as well.  She will also need fasciotomies.  I discussed with her the high likelihood of amputation during this hospitalization given the length of time since onset.  Patient is also an uric she was being treated with IV medicine for hyperkalemia and we will plan to place temporary dialysis catheter at this time as well.  Heparin drip was ordered although patient has difficult IV access at this time.  Plumer Mittelstaedt C. Donzetta Matters, MD [facility] Office: [phone] Pager: [phone]

## 2020-04-30 NOTE — Anesthesia Procedure Notes (Signed)
Procedure Name: Intubation Date/Time: 05/07/2020 7:13 PM Performed by: Claris Che, CRNA Pre-anesthesia Checklist: Patient identified, Emergency Drugs available, Suction available and Patient being monitored Patient Re-evaluated:Patient Re-evaluated prior to induction Oxygen Delivery Method: Circle system utilized Preoxygenation: Pre-oxygenation with 100% oxygen Induction Type: IV induction, Rapid sequence and Cricoid Pressure applied Ventilation: Mask ventilation without difficulty Laryngoscope Size: Mac and 4 Grade View: Grade II Tube type: Oral Tube size: 7.0 mm Number of attempts: 1 Airway Equipment and Method: Stylet Placement Confirmation: ETT inserted through vocal cords under direct vision,  positive ETCO2 and breath sounds checked- equal and bilateral Secured at: 23 cm Tube secured with: Tape Dental Injury: Teeth and Oropharynx as per pre-operative assessment

## 2020-04-30 NOTE — Progress Notes (Signed)
Received critical from lab for potassium 6.3. Primary RN aware.

## 2020-05-01 ENCOUNTER — Inpatient Hospital Stay (HOSPITAL_COMMUNITY): Payer: Medicare Other

## 2020-05-01 ENCOUNTER — Inpatient Hospital Stay (HOSPITAL_COMMUNITY): Payer: Medicare Other | Admitting: Anesthesiology

## 2020-05-01 ENCOUNTER — Encounter (HOSPITAL_COMMUNITY): Admission: EM | Disposition: E | Payer: Self-pay | Source: Home / Self Care | Attending: Critical Care Medicine

## 2020-05-01 ENCOUNTER — Encounter (HOSPITAL_COMMUNITY): Payer: Self-pay | Admitting: Vascular Surgery

## 2020-05-01 DIAGNOSIS — I96 Gangrene, not elsewhere classified: Secondary | ICD-10-CM

## 2020-05-01 DIAGNOSIS — R238 Other skin changes: Secondary | ICD-10-CM | POA: Diagnosis present

## 2020-05-01 DIAGNOSIS — J9601 Acute respiratory failure with hypoxia: Secondary | ICD-10-CM

## 2020-05-01 DIAGNOSIS — N179 Acute kidney failure, unspecified: Secondary | ICD-10-CM | POA: Diagnosis present

## 2020-05-01 DIAGNOSIS — T79A21A Traumatic compartment syndrome of right lower extremity, initial encounter: Secondary | ICD-10-CM | POA: Diagnosis present

## 2020-05-01 DIAGNOSIS — R578 Other shock: Secondary | ICD-10-CM | POA: Diagnosis present

## 2020-05-01 DIAGNOSIS — M79A22 Nontraumatic compartment syndrome of left lower extremity: Secondary | ICD-10-CM

## 2020-05-01 DIAGNOSIS — R4182 Altered mental status, unspecified: Secondary | ICD-10-CM | POA: Diagnosis present

## 2020-05-01 HISTORY — PX: AMPUTATION: SHX166

## 2020-05-01 HISTORY — PX: FASCIOTOMY: SHX132

## 2020-05-01 LAB — RENAL FUNCTION PANEL
Albumin: 2.6 g/dL — ABNORMAL LOW (ref 3.5–5.0)
Anion gap: 16 — ABNORMAL HIGH (ref 5–15)
BUN: 60 mg/dL — ABNORMAL HIGH (ref 8–23)
CO2: 14 mmol/L — ABNORMAL LOW (ref 22–32)
Calcium: 6.8 mg/dL — ABNORMAL LOW (ref 8.9–10.3)
Chloride: 106 mmol/L (ref 98–111)
Creatinine, Ser: 4.23 mg/dL — ABNORMAL HIGH (ref 0.44–1.00)
GFR calc Af Amer: 12 mL/min — ABNORMAL LOW (ref >60)
GFR calc non Af Amer: 10 mL/min — ABNORMAL LOW (ref >60)
Glucose, Bld: 100 mg/dL — ABNORMAL HIGH (ref 70–99)
Phosphorus: 9.6 mg/dL — ABNORMAL HIGH (ref 2.5–4.6)
Potassium: 5.2 mmol/L — ABNORMAL HIGH (ref 3.5–5.1)
Sodium: 136 mmol/L (ref 135–145)

## 2020-05-01 LAB — POCT I-STAT, CHEM 8
BUN: 101 mg/dL — ABNORMAL HIGH (ref 8–23)
Calcium, Ion: 0.89 mmol/L — CL (ref 1.15–1.40)
Chloride: 102 mmol/L (ref 98–111)
Creatinine, Ser: 7.1 mg/dL — ABNORMAL HIGH (ref 0.44–1.00)
Glucose, Bld: 124 mg/dL — ABNORMAL HIGH (ref 70–99)
HCT: 27 % — ABNORMAL LOW (ref 36.0–46.0)
Hemoglobin: 9.2 g/dL — ABNORMAL LOW (ref 12.0–15.0)
Potassium: 4.7 mmol/L (ref 3.5–5.1)
Sodium: 134 mmol/L — ABNORMAL LOW (ref 135–145)
TCO2: 16 mmol/L — ABNORMAL LOW (ref 22–32)

## 2020-05-01 LAB — COMPREHENSIVE METABOLIC PANEL
ALT: 1723 U/L — ABNORMAL HIGH (ref 0–44)
AST: 2016 U/L — ABNORMAL HIGH (ref 15–41)
Albumin: 2.2 g/dL — ABNORMAL LOW (ref 3.5–5.0)
Alkaline Phosphatase: 64 U/L (ref 38–126)
Anion gap: 18 — ABNORMAL HIGH (ref 5–15)
BUN: 42 mg/dL — ABNORMAL HIGH (ref 8–23)
CO2: 12 mmol/L — ABNORMAL LOW (ref 22–32)
Calcium: 6.7 mg/dL — ABNORMAL LOW (ref 8.9–10.3)
Chloride: 105 mmol/L (ref 98–111)
Creatinine, Ser: 3.13 mg/dL — ABNORMAL HIGH (ref 0.44–1.00)
GFR calc Af Amer: 17 mL/min — ABNORMAL LOW (ref >60)
GFR calc non Af Amer: 15 mL/min — ABNORMAL LOW (ref >60)
Glucose, Bld: 91 mg/dL (ref 70–99)
Potassium: 4.7 mmol/L (ref 3.5–5.1)
Sodium: 135 mmol/L (ref 135–145)
Total Bilirubin: 2.9 mg/dL — ABNORMAL HIGH (ref 0.3–1.2)
Total Protein: 4 g/dL — ABNORMAL LOW (ref 6.5–8.1)

## 2020-05-01 LAB — CK: Total CK: >50000 U/L — ABNORMAL HIGH (ref 38–234)

## 2020-05-01 LAB — POCT I-STAT 7, (LYTES, BLD GAS, ICA,H+H)
Acid-base deficit: 13 mmol/L — ABNORMAL HIGH (ref 0.0–2.0)
Bicarbonate: 13.1 mmol/L — ABNORMAL LOW (ref 20.0–28.0)
Calcium, Ion: 0.83 mmol/L — CL (ref 1.15–1.40)
HCT: 16 % — ABNORMAL LOW (ref 36.0–46.0)
Hemoglobin: 5.4 g/dL — CL (ref 12.0–15.0)
O2 Saturation: 100 %
Patient temperature: 97.2
Potassium: 5.6 mmol/L — ABNORMAL HIGH (ref 3.5–5.1)
Sodium: 137 mmol/L (ref 135–145)
TCO2: 14 mmol/L — ABNORMAL LOW (ref 22–32)
pCO2 arterial: 30.8 mmHg — ABNORMAL LOW (ref 32.0–48.0)
pH, Arterial: 7.231 — ABNORMAL LOW (ref 7.350–7.450)
pO2, Arterial: 282 mmHg — ABNORMAL HIGH (ref 83.0–108.0)

## 2020-05-01 LAB — CBC
HCT: 39 % (ref 36.0–46.0)
HCT: 35.6 % — ABNORMAL LOW (ref 36.0–46.0)
HCT: 22.3 % — ABNORMAL LOW (ref 36.0–46.0)
Hemoglobin: 13 g/dL (ref 12.0–15.0)
Hemoglobin: 11.7 g/dL — ABNORMAL LOW (ref 12.0–15.0)
Hemoglobin: 7.6 g/dL — ABNORMAL LOW (ref 12.0–15.0)
MCH: 28.7 pg (ref 26.0–34.0)
MCH: 28.3 pg (ref 26.0–34.0)
MCH: 29 pg (ref 26.0–34.0)
MCHC: 33.3 g/dL (ref 30.0–36.0)
MCHC: 32.9 g/dL (ref 30.0–36.0)
MCHC: 34.1 g/dL (ref 30.0–36.0)
MCV: 86.1 fL (ref 80.0–100.0)
MCV: 86.2 fL (ref 80.0–100.0)
MCV: 85.1 fL (ref 80.0–100.0)
Platelets: 223 10*3/uL (ref 150–400)
Platelets: 172 10*3/uL (ref 150–400)
Platelets: 97 10*3/uL — ABNORMAL LOW (ref 150–400)
RBC: 4.53 MIL/uL (ref 3.87–5.11)
RBC: 4.13 MIL/uL (ref 3.87–5.11)
RBC: 2.62 MIL/uL — ABNORMAL LOW (ref 3.87–5.11)
RDW: 14.7 % (ref 11.5–15.5)
RDW: 15.6 % — ABNORMAL HIGH (ref 11.5–15.5)
RDW: 15.4 % (ref 11.5–15.5)
WBC: 28.5 10*3/uL — ABNORMAL HIGH (ref 4.0–10.5)
WBC: 25 10*3/uL — ABNORMAL HIGH (ref 4.0–10.5)
WBC: 16 10*3/uL — ABNORMAL HIGH (ref 4.0–10.5)
nRBC: 0.1 % (ref 0.0–0.2)
nRBC: 0 % (ref 0.0–0.2)
nRBC: 0 % (ref 0.0–0.2)

## 2020-05-01 LAB — GLUCOSE, CAPILLARY
Glucose-Capillary: 198 mg/dL — ABNORMAL HIGH (ref 70–99)
Glucose-Capillary: 263 mg/dL — ABNORMAL HIGH (ref 70–99)
Glucose-Capillary: 306 mg/dL — ABNORMAL HIGH (ref 70–99)
Glucose-Capillary: 64 mg/dL — ABNORMAL LOW (ref 70–99)
Glucose-Capillary: 84 mg/dL (ref 70–99)
Glucose-Capillary: 87 mg/dL (ref 70–99)

## 2020-05-01 LAB — MRSA PCR SCREENING: MRSA by PCR: NEGATIVE

## 2020-05-01 LAB — PREPARE RBC (CROSSMATCH)

## 2020-05-01 LAB — CREATININE, SERUM
Creatinine, Ser: 4.81 mg/dL — ABNORMAL HIGH (ref 0.44–1.00)
GFR calc Af Amer: 10 mL/min — ABNORMAL LOW (ref >60)
GFR calc non Af Amer: 9 mL/min — ABNORMAL LOW (ref >60)

## 2020-05-01 LAB — APTT: aPTT: 35 seconds (ref 24–36)

## 2020-05-01 LAB — LACTIC ACID, PLASMA
Lactic Acid, Venous: 5.5 mmol/L (ref 0.5–1.9)
Lactic Acid, Venous: 9.2 mmol/L (ref 0.5–1.9)
Lactic Acid, Venous: 6.9 mmol/L (ref 0.5–1.9)

## 2020-05-01 LAB — TRIGLYCERIDES: Triglycerides: 97 mg/dL (ref <150)

## 2020-05-01 LAB — ABO/RH: ABO/RH(D): A POS

## 2020-05-01 LAB — POTASSIUM: Potassium: 5.2 mmol/L — ABNORMAL HIGH (ref 3.5–5.1)

## 2020-05-01 LAB — PHOSPHORUS: Phosphorus: 8.9 mg/dL — ABNORMAL HIGH (ref 2.5–4.6)

## 2020-05-01 LAB — MAGNESIUM: Magnesium: 2.4 mg/dL (ref 1.7–2.4)

## 2020-05-01 SURGERY — AMPUTATION, ABOVE KNEE
Anesthesia: General | Site: Leg Lower | Laterality: Right

## 2020-05-01 MED ORDER — 0.9 % SODIUM CHLORIDE (POUR BTL) OPTIME
TOPICAL | Status: DC | PRN
  Administered 2020-05-01 (×2): 1000 mL

## 2020-05-01 MED ORDER — ORAL CARE MOUTH RINSE
15.0000 mL | OROMUCOSAL | Status: DC
  Administered 2020-05-01 – 2020-05-06 (×51): 15 mL via OROMUCOSAL

## 2020-05-01 MED ORDER — DEXTROSE 50 % IV SOLN
INTRAVENOUS | Status: AC
  Administered 2020-05-01: 25 mL
  Filled 2020-05-01: qty 50

## 2020-05-01 MED ORDER — ALBUMIN HUMAN 5 % IV SOLN
INTRAVENOUS | Status: DC | PRN
Start: 2020-05-01 — End: 2020-05-01

## 2020-05-01 MED ORDER — INSULIN ASPART 100 UNIT/ML ~~LOC~~ SOLN
0.0000 [IU] | SUBCUTANEOUS | Status: DC
  Administered 2020-05-01: 3 [IU] via SUBCUTANEOUS
  Administered 2020-05-01: 8 [IU] via SUBCUTANEOUS
  Administered 2020-05-02: 5 [IU] via SUBCUTANEOUS
  Administered 2020-05-02: 8 [IU] via SUBCUTANEOUS
  Administered 2020-05-02: 11 [IU] via SUBCUTANEOUS
  Administered 2020-05-03 – 2020-05-06 (×5): 2 [IU] via SUBCUTANEOUS

## 2020-05-01 MED ORDER — MORPHINE SULFATE (PF) 2 MG/ML IV SOLN
2.0000 mg | INTRAVENOUS | Status: DC | PRN
  Administered 2020-05-01: 2 mg via INTRAVENOUS
  Filled 2020-05-01: qty 1

## 2020-05-01 MED ORDER — CEFAZOLIN SODIUM-DEXTROSE 2-4 GM/100ML-% IV SOLN
INTRAVENOUS | Status: AC
  Filled 2020-05-01: qty 100

## 2020-05-01 MED ORDER — PRO-STAT SUGAR FREE PO LIQD
30.0000 mL | Freq: Two times a day (BID) | ORAL | Status: DC
  Administered 2020-05-01 – 2020-05-05 (×9): 30 mL
  Filled 2020-05-01 (×9): qty 30

## 2020-05-01 MED ORDER — ONDANSETRON HCL 4 MG/2ML IJ SOLN: 4.0000 mg | Freq: Four times a day (QID) | INTRAMUSCULAR | Status: DC | PRN

## 2020-05-01 MED ORDER — PHENYLEPHRINE HCL-NACL 10-0.9 MG/250ML-% IV SOLN
INTRAVENOUS | Status: AC
  Filled 2020-05-01: qty 250

## 2020-05-01 MED ORDER — CEFAZOLIN SODIUM-DEXTROSE 2-3 GM-%(50ML) IV SOLR
INTRAVENOUS | Status: DC | PRN
Start: 2020-05-01 — End: 2020-05-01
  Administered 2020-05-01: 2 g via INTRAVENOUS

## 2020-05-01 MED ORDER — SODIUM CHLORIDE 0.9 % IV SOLN
INTRAVENOUS | Status: DC | PRN
  Administered 2020-05-01: 500 mL via INTRAVENOUS
  Administered 2020-05-01: 250 mL via INTRAVENOUS

## 2020-05-01 MED ORDER — CHLORHEXIDINE GLUCONATE 0.12% ORAL RINSE (MEDLINE KIT)
15.0000 mL | Freq: Two times a day (BID) | OROMUCOSAL | Status: DC
  Administered 2020-05-01 – 2020-05-06 (×11): 15 mL via OROMUCOSAL

## 2020-05-01 MED ORDER — PHENYLEPHRINE CONCENTRATED 100MG/250ML (0.4 MG/ML) INFUSION SIMPLE
0.0000 ug/min | INTRAVENOUS | Status: DC
  Administered 2020-05-01: 60 ug/min via INTRAVENOUS
  Filled 2020-05-01: qty 250

## 2020-05-01 MED ORDER — FENTANYL CITRATE (PF) 250 MCG/5ML IJ SOLN
INTRAMUSCULAR | Status: AC
  Filled 2020-05-01: qty 5

## 2020-05-01 MED ORDER — HEPARIN SODIUM (PORCINE) 5000 UNIT/ML IJ SOLN
5000.0000 [IU] | Freq: Three times a day (TID) | INTRAMUSCULAR | Status: DC
  Administered 2020-05-01 – 2020-05-04 (×8): 5000 [IU] via SUBCUTANEOUS
  Filled 2020-05-01 (×8): qty 1

## 2020-05-01 MED ORDER — ROCURONIUM BROMIDE 10 MG/ML (PF) SYRINGE
PREFILLED_SYRINGE | INTRAVENOUS | Status: DC | PRN
  Administered 2020-05-01: 100 mg via INTRAVENOUS

## 2020-05-01 MED ORDER — MIDAZOLAM HCL 2 MG/2ML IJ SOLN
INTRAMUSCULAR | Status: AC
  Filled 2020-05-01: qty 2

## 2020-05-01 MED ORDER — ROCURONIUM BROMIDE 10 MG/ML (PF) SYRINGE
PREFILLED_SYRINGE | INTRAVENOUS | Status: AC
  Filled 2020-05-01: qty 10

## 2020-05-01 MED ORDER — LIDOCAINE 2% (20 MG/ML) 5 ML SYRINGE
INTRAMUSCULAR | Status: AC
  Filled 2020-05-01: qty 5

## 2020-05-01 MED ORDER — SODIUM CHLORIDE 0.9 % IV SOLN
2.0000 g | Freq: Two times a day (BID) | INTRAVENOUS | Status: DC
  Administered 2020-05-01 – 2020-05-03 (×5): 2 g via INTRAVENOUS
  Filled 2020-05-01 (×5): qty 2

## 2020-05-01 MED ORDER — SODIUM CHLORIDE 0.9 % IV SOLN
INTRAVENOUS | Status: DC | PRN
  Administered 2020-05-04: 250 mL via INTRAVENOUS

## 2020-05-01 MED ORDER — CEFAZOLIN SODIUM-DEXTROSE 2-4 GM/100ML-% IV SOLN
2.0000 g | Freq: Two times a day (BID) | INTRAVENOUS | Status: DC
  Administered 2020-05-01: 2 g via INTRAVENOUS
  Filled 2020-05-01: qty 100

## 2020-05-01 MED ORDER — CALCIUM GLUCONATE-NACL 1-0.675 GM/50ML-% IV SOLN
1.0000 g | Freq: Once | INTRAVENOUS | Status: AC
  Administered 2020-05-01: 1000 mg via INTRAVENOUS
  Filled 2020-05-01: qty 50

## 2020-05-01 MED ORDER — VASOPRESSIN 20 UNIT/ML IV SOLN
0.0400 [IU]/min | INTRAVENOUS | Status: DC
  Administered 2020-05-01 – 2020-05-03 (×4): 0.03 [IU]/min via INTRAVENOUS
  Administered 2020-05-04 – 2020-05-06 (×3): 0.04 [IU]/min via INTRAVENOUS
  Filled 2020-05-01 (×8): qty 2

## 2020-05-01 MED ORDER — ALUM & MAG HYDROXIDE-SIMETH 200-200-20 MG/5ML PO SUSP
15.0000 mL | ORAL | Status: DC | PRN
  Filled 2020-05-01: qty 30

## 2020-05-01 MED ORDER — OXYCODONE-ACETAMINOPHEN 5-325 MG PO TABS: 1.0000 | ORAL_TABLET | ORAL | Status: DC | PRN

## 2020-05-01 MED ORDER — SODIUM CHLORIDE 0.9% FLUSH: 10.0000 mL | INTRAVENOUS | Status: DC | PRN

## 2020-05-01 MED ORDER — VANCOMYCIN HCL 2000 MG/400ML IV SOLN
2000.0000 mg | Freq: Once | INTRAVENOUS | Status: AC
  Administered 2020-05-01: 2000 mg via INTRAVENOUS
  Filled 2020-05-01: qty 400

## 2020-05-01 MED ORDER — LABETALOL HCL 5 MG/ML IV SOLN: 10.0000 mg | INTRAVENOUS | Status: DC | PRN

## 2020-05-01 MED ORDER — PHENYLEPHRINE HCL-NACL 10-0.9 MG/250ML-% IV SOLN: 0.0000 ug/min | INTRAVENOUS | Status: DC

## 2020-05-01 MED ORDER — PHENYLEPHRINE 40 MCG/ML (10ML) SYRINGE FOR IV PUSH (FOR BLOOD PRESSURE SUPPORT)
PREFILLED_SYRINGE | INTRAVENOUS | Status: AC
  Administered 2020-05-01: 800 ug
  Filled 2020-05-01: qty 20

## 2020-05-01 MED ORDER — CHLORHEXIDINE GLUCONATE CLOTH 2 % EX PADS
6.0000 | MEDICATED_PAD | Freq: Every day | CUTANEOUS | Status: DC
  Administered 2020-05-01 – 2020-05-05 (×6): 6 via TOPICAL

## 2020-05-01 MED ORDER — SODIUM CHLORIDE 0.9 % IV SOLN: 2.0000 g | Freq: Two times a day (BID) | INTRAVENOUS | Status: DC

## 2020-05-01 MED ORDER — HEPARIN SODIUM (PORCINE) 1000 UNIT/ML DIALYSIS: 1500.0000 [IU] | INTRAMUSCULAR | Status: DC | PRN

## 2020-05-01 MED ORDER — METOPROLOL TARTRATE 5 MG/5ML IV SOLN: 2.0000 mg | INTRAVENOUS | Status: DC | PRN

## 2020-05-01 MED ORDER — LIP MEDEX EX OINT
TOPICAL_OINTMENT | CUTANEOUS | Status: DC | PRN
  Filled 2020-05-01: qty 7

## 2020-05-01 MED ORDER — MIDAZOLAM HCL 2 MG/2ML IJ SOLN
INTRAMUSCULAR | Status: DC | PRN
  Administered 2020-05-01: 2 mg via INTRAVENOUS

## 2020-05-01 MED ORDER — PANTOPRAZOLE SODIUM 40 MG PO PACK
40.0000 mg | PACK | Freq: Every day | ORAL | Status: DC
  Administered 2020-05-01 – 2020-05-04 (×4): 40 mg
  Filled 2020-05-01 (×4): qty 20

## 2020-05-01 MED ORDER — CHLORHEXIDINE GLUCONATE 0.12% ORAL RINSE (MEDLINE KIT): 15.0000 mL | Freq: Two times a day (BID) | OROMUCOSAL | Status: DC

## 2020-05-01 MED ORDER — PHENYLEPHRINE CONCENTRATED 100MG/250ML (0.4 MG/ML) INFUSION SIMPLE
0.0000 ug/min | INTRAVENOUS | Status: DC
  Administered 2020-05-01: 400 ug/min via INTRAVENOUS
  Administered 2020-05-02: 20 ug/min via INTRAVENOUS
  Administered 2020-05-03: 170 ug/min via INTRAVENOUS
  Administered 2020-05-04: 400 ug/min via INTRAVENOUS
  Administered 2020-05-04: 110 ug/min via INTRAVENOUS
  Administered 2020-05-04: 400 ug/min via INTRAVENOUS
  Administered 2020-05-05: 250 ug/min via INTRAVENOUS
  Administered 2020-05-05 (×2): 200 ug/min via INTRAVENOUS
  Administered 2020-05-06: 180 ug/min via INTRAVENOUS
  Filled 2020-05-01 (×11): qty 250

## 2020-05-01 MED ORDER — EPINEPHRINE 1 MG/10ML IJ SOSY
PREFILLED_SYRINGE | INTRAMUSCULAR | Status: AC
  Filled 2020-05-01: qty 10

## 2020-05-01 MED ORDER — PROPOFOL 10 MG/ML IV BOLUS
INTRAVENOUS | Status: AC
  Filled 2020-05-01: qty 20

## 2020-05-01 MED ORDER — SODIUM CHLORIDE 0.9% FLUSH
10.0000 mL | Freq: Two times a day (BID) | INTRAVENOUS | Status: DC
  Administered 2020-05-01 – 2020-05-04 (×7): 10 mL
  Administered 2020-05-04: 30 mL
  Administered 2020-05-05 – 2020-05-06 (×3): 10 mL

## 2020-05-01 MED ORDER — SODIUM BICARBONATE-DEXTROSE 150-5 MEQ/L-% IV SOLN
150.0000 meq | INTRAVENOUS | Status: DC
  Administered 2020-05-01 – 2020-05-02 (×3): 150 meq via INTRAVENOUS
  Filled 2020-05-01 (×4): qty 1000

## 2020-05-01 MED ORDER — VITAL HIGH PROTEIN PO LIQD: 1000.0000 mL | ORAL | Status: DC

## 2020-05-01 MED ORDER — HYDRALAZINE HCL 20 MG/ML IJ SOLN: 5.0000 mg | INTRAMUSCULAR | Status: DC | PRN

## 2020-05-01 MED ORDER — GUAIFENESIN-DM 100-10 MG/5ML PO SYRP
15.0000 mL | ORAL_SOLUTION | ORAL | Status: DC | PRN
  Filled 2020-05-01: qty 15

## 2020-05-01 MED ORDER — NOREPINEPHRINE 16 MG/250ML-% IV SOLN
0.0000 ug/min | INTRAVENOUS | Status: DC
  Administered 2020-05-01: 5 ug/min via INTRAVENOUS
  Filled 2020-05-01: qty 250

## 2020-05-01 MED ORDER — CLINDAMYCIN PHOSPHATE 600 MG/50ML IV SOLN
600.0000 mg | Freq: Three times a day (TID) | INTRAVENOUS | Status: DC
  Administered 2020-05-01 – 2020-05-02 (×4): 600 mg via INTRAVENOUS
  Filled 2020-05-01 (×5): qty 50

## 2020-05-01 MED ORDER — ORAL CARE MOUTH RINSE: 15.0000 mL | OROMUCOSAL | Status: DC

## 2020-05-01 MED ORDER — PHENYLEPHRINE HCL-NACL 20-0.9 MG/250ML-% IV SOLN
0.0000 ug/min | INTRAVENOUS | Status: DC
  Filled 2020-05-01: qty 250

## 2020-05-01 MED ORDER — SODIUM CHLORIDE 0.9 % IV SOLN: INTRAVENOUS | Status: DC | PRN

## 2020-05-01 MED ORDER — PHENOL 1.4 % MT LIQD
1.0000 | OROMUCOSAL | Status: DC | PRN
  Filled 2020-05-01: qty 177

## 2020-05-01 MED ORDER — SODIUM CHLORIDE 0.9% IV SOLUTION: Freq: Once | INTRAVENOUS | Status: DC

## 2020-05-01 MED ORDER — VANCOMYCIN HCL IN DEXTROSE 1-5 GM/200ML-% IV SOLN
1000.0000 mg | INTRAVENOUS | Status: DC
  Administered 2020-05-02 – 2020-05-03 (×2): 1000 mg via INTRAVENOUS
  Filled 2020-05-01 (×3): qty 200

## 2020-05-01 MED ORDER — VITAL AF 1.2 CAL PO LIQD
1000.0000 mL | ORAL | Status: DC
  Administered 2020-05-01 – 2020-05-03 (×2): 1000 mL
  Filled 2020-05-01: qty 1000

## 2020-05-01 MED ORDER — FENTANYL CITRATE (PF) 250 MCG/5ML IJ SOLN
INTRAMUSCULAR | Status: DC | PRN
  Administered 2020-05-01: 50 ug via INTRAVENOUS
  Administered 2020-05-01: 100 ug via INTRAVENOUS

## 2020-05-01 SURGICAL SUPPLY — 68 items
BANDAGE ESMARK 6X9 LF (GAUZE/BANDAGES/DRESSINGS) ×2 IMPLANT
BLADE SAW SAG 73X25 THK (BLADE) ×1
BLADE SAW SGTL 73X25 THK (BLADE) ×2 IMPLANT
BNDG COHESIVE 6X5 TAN STRL LF (GAUZE/BANDAGES/DRESSINGS) ×3 IMPLANT
BNDG ELASTIC 4X5.8 VLCR STR LF (GAUZE/BANDAGES/DRESSINGS) ×6 IMPLANT
BNDG ELASTIC 6X15 VLCR STRL LF (GAUZE/BANDAGES/DRESSINGS) ×3 IMPLANT
BNDG ELASTIC 6X5.8 VLCR STR LF (GAUZE/BANDAGES/DRESSINGS) ×3 IMPLANT
BNDG ESMARK 6X9 LF (GAUZE/BANDAGES/DRESSINGS) ×3
BNDG GAUZE ELAST 4 BULKY (GAUZE/BANDAGES/DRESSINGS) ×15 IMPLANT
CANISTER SUCT 3000ML PPV (MISCELLANEOUS) ×3 IMPLANT
CLIP VESOCCLUDE MED 24/CT (CLIP) ×3 IMPLANT
CLIP VESOCCLUDE MED 6/CT (CLIP) ×3 IMPLANT
CLIP VESOCCLUDE SM WIDE 24/CT (CLIP) ×3 IMPLANT
COVER BACK TABLE 60X90IN (DRAPES) ×3 IMPLANT
COVER SURGICAL LIGHT HANDLE (MISCELLANEOUS) ×6 IMPLANT
COVER WAND RF STERILE (DRAPES) ×3 IMPLANT
DRAIN CHANNEL 19F RND (DRAIN) IMPLANT
DRAPE HALF SHEET 40X57 (DRAPES) ×3 IMPLANT
DRAPE INCISE IOBAN 66X45 STRL (DRAPES) IMPLANT
DRAPE ORTHO SPLIT 77X108 STRL (DRAPES) ×6
DRAPE SURG ORHT 6 SPLT 77X108 (DRAPES) ×4 IMPLANT
DRAPE U-SHAPE 47X51 STRL (DRAPES) IMPLANT
DRSG ADAPTIC 3X8 NADH LF (GAUZE/BANDAGES/DRESSINGS) ×3 IMPLANT
DRSG PAD ABDOMINAL 8X10 ST (GAUZE/BANDAGES/DRESSINGS) ×12 IMPLANT
ELECT CAUTERY BLADE 6.4 (BLADE) ×3 IMPLANT
ELECT REM PT RETURN 9FT ADLT (ELECTROSURGICAL) ×3
ELECTRODE REM PT RTRN 9FT ADLT (ELECTROSURGICAL) ×2 IMPLANT
EVACUATOR SILICONE 100CC (DRAIN) IMPLANT
GAUZE SPONGE 4X4 12PLY STRL (GAUZE/BANDAGES/DRESSINGS) ×9 IMPLANT
GAUZE XEROFORM 5X9 LF (GAUZE/BANDAGES/DRESSINGS) ×6 IMPLANT
GLOVE BIO SURGEON STRL SZ7.5 (GLOVE) ×9 IMPLANT
GLOVE BIOGEL PI IND STRL 7.0 (GLOVE) ×4 IMPLANT
GLOVE BIOGEL PI IND STRL 8 (GLOVE) ×2 IMPLANT
GLOVE BIOGEL PI INDICATOR 7.0 (GLOVE) ×2
GLOVE BIOGEL PI INDICATOR 8 (GLOVE) ×1
GLOVE SURG SS PI 6.5 STRL IVOR (GLOVE) ×6 IMPLANT
GOWN STRL REUS W/ TWL LRG LVL3 (GOWN DISPOSABLE) ×4 IMPLANT
GOWN STRL REUS W/ TWL XL LVL3 (GOWN DISPOSABLE) ×4 IMPLANT
GOWN STRL REUS W/TWL LRG LVL3 (GOWN DISPOSABLE) ×6
GOWN STRL REUS W/TWL XL LVL3 (GOWN DISPOSABLE) ×6
KIT BASIN OR (CUSTOM PROCEDURE TRAY) ×3 IMPLANT
KIT TURNOVER KIT B (KITS) ×3 IMPLANT
NS IRRIG 1000ML POUR BTL (IV SOLUTION) ×3 IMPLANT
PACK GENERAL/GYN (CUSTOM PROCEDURE TRAY) ×3 IMPLANT
PACK UNIVERSAL I (CUSTOM PROCEDURE TRAY) ×3 IMPLANT
PAD ARMBOARD 7.5X6 YLW CONV (MISCELLANEOUS) ×6 IMPLANT
SPONGE LAP 18X18 RF (DISPOSABLE) ×3 IMPLANT
STAPLER VISISTAT 35W (STAPLE) ×3 IMPLANT
STOCKINETTE IMPERVIOUS LG (DRAPES) ×3 IMPLANT
SUT BONE WAX W31G (SUTURE) ×3 IMPLANT
SUT ETHILON 3 0 PS 1 (SUTURE) IMPLANT
SUT MNCRL AB 4-0 PS2 18 (SUTURE) IMPLANT
SUT PROLENE 5 0 C 1 24 (SUTURE) ×3 IMPLANT
SUT PROLENE 6 0 BV (SUTURE) ×3 IMPLANT
SUT SILK 0 TIES 10X30 (SUTURE) ×3 IMPLANT
SUT SILK 2 0 (SUTURE) ×3
SUT SILK 2 0 SH CR/8 (SUTURE) ×3 IMPLANT
SUT SILK 2-0 18XBRD TIE 12 (SUTURE) ×2 IMPLANT
SUT VIC AB 2-0 CT1 18 (SUTURE) ×9 IMPLANT
SUT VIC AB 2-0 CT1 27 (SUTURE) ×3
SUT VIC AB 2-0 CT1 TAPERPNT 27 (SUTURE) ×2 IMPLANT
SUT VIC AB 2-0 CTX 36 (SUTURE) IMPLANT
SUT VIC AB 3-0 SH 18 (SUTURE) IMPLANT
SUT VIC AB 3-0 SH 27 (SUTURE)
SUT VIC AB 3-0 SH 27X BRD (SUTURE) IMPLANT
TOWEL GREEN STERILE (TOWEL DISPOSABLE) ×6 IMPLANT
UNDERPAD 30X36 HEAVY ABSORB (UNDERPADS AND DIAPERS) ×3 IMPLANT
WATER STERILE IRR 1000ML POUR (IV SOLUTION) ×3 IMPLANT

## 2020-05-01 NOTE — Progress Notes (Signed)
Inpatient Diabetes Program Recommendations  AACE/ADA: New Consensus Statement on Inpatient Glycemic Control (2015)  Target Ranges:  Prepandial:   less than 140 mg/dL      Peak postprandial:   less than 180 mg/dL (1-2 hours)      Critically ill patients:  140 - 180 mg/dL   Lab Results  Component Value Date   GLUCAP 198 (H) 04/20/2020   HGBA1C 6.3 (H) 04/29/2020    Review of Glycemic Control  Inpatient Diabetes Program Recommendations:   Noted patient is NPO.  -D/C tid Novolog correction -ICU glycemic control order set  Thank you, Nani Gasser. Wilfrido Luedke, RN, MSN, CDE  Diabetes Coordinator Inpatient Glycemic Control Team Team Pager [phone] (8am-5pm) 04/21/2020 3:15 PM

## 2020-05-01 NOTE — Consult Note (Signed)
[facility] for [facility]ious [facility]    Date of Admission:  04/20/2020   Total days of antibiotics 2        Day 1 vancomycin        Day 1 cefepime        Day 1 clindamycin       Reason for Consult: Blisters on lower extremities    Referring Provider: Dr. Ernest Mallick  Assessment: I suspect that her lower extremity blisters are due to trauma.  She is able to tell me that she did not have blisters before she fell out of bed.  She indicates that she was moving her legs against the floor and an attempt to get up.  However she is critically ill and I am certainly in favor of continuing empiric antibiotic therapy pending further observation and final culture results.  Plan: 1. Continue current antibiotics for now  Principal Problem:   Blisters of multiple sites Active Problems:   Compartment syndrome of right lower extremity (HCC)   Rhabdomyolysis   Respiratory failure (HCC)   Acute renal failure superimposed on stage 3 chronic kidney disease ([facility])   AMS (altered mental status)   Hemorrhagic shock (HCC)   Scheduled Meds:  chlorhexidine gluconate (MEDLINE KIT)  15 mL Mouth Rinse BID   Chlorhexidine Gluconate Cloth  6 each Topical Daily   insulin aspart  5 Units Intravenous Once   And   dextrose  1 ampule Intravenous Once   docusate  100 mg Oral BID   feeding supplement (PRO-STAT SUGAR FREE 64)  30 mL Per Tube BID   feeding supplement (VITAL HIGH PROTEIN)  1,000 mL Per Tube Q24H   heparin  5,000 Units Subcutaneous Q8H   insulin aspart  0-15 Units Subcutaneous TID WC   mouth rinse  15 mL Mouth Rinse 10 times per day   pantoprazole sodium  40 mg Per Tube Daily   polyethylene glycol  17 g Oral Daily   rocuronium  50 mg Intravenous Once   sodium bicarbonate  50 mEq Intravenous Once   sodium chloride flush  10-40 mL Intracatheter Q12H   sodium zirconium cyclosilicate  10 g Oral TID   Continuous Infusions:  sodium chloride 500 mL (04/22/2020 1024)    sodium chloride     calcium gluconate     ceFEPime (MAXIPIME) IV     clindamycin (CLEOCIN) IV 600 mg (04/23/2020 1024)   norepinephrine (LEVOPHED) Adult infusion 5 mcg/min (05/09/2020 1019)   prismasol BGK 0/2.5 400 mL/hr at 04/25/2020 0053   prismasol BGK 0/2.5 200 mL/hr at 05/08/2020 0053   prismasol BGK 2/2.5 dialysis solution 2,000 mL/hr at 05/05/2020 0800   propofol (DIPRIVAN) infusion     [START ON 05/02/2020] vancomycin     vancomycin 2,000 mg (04/11/2020 0930)   PRN Meds:.sodium chloride, sodium chloride, acetaminophen **OR** acetaminophen, alteplase, docusate sodium, fentaNYL (SUBLIMAZE) injection, fentaNYL (SUBLIMAZE) injection, heparin, heparin, metoprolol tartrate, ondansetron, polyethylene glycol, senna-docusate, sodium chloride, sodium chloride flush  HPI: [name] is a 64 y.o. female with a history of hypertension and chronic kidney disease who fell out of bed and could not get up leading to admission on 05/02/2020.  She had rhabdomyolysis and blisters on both legs.  She developed swelling and mottling of her right leg and was taken to the OR yesterday.  She underwent fasciotomies for compartment syndrome.  She is hypotensive and severely anemic.  She has been afebrile.  Admission blood cultures are negative.  Review of Systems: Review of Systems  Unable to perform ROS: Intubated    Past Medical History:  Diagnosis Date   Balance disorder    Hypertension    Renal disorder    Chronic kidney disease    Social History   Tobacco Use   Smoking status: Never Smoker   Smokeless tobacco: Never Used  Vaping Use   Vaping Use: Never used  Substance Use Topics   Alcohol use: Not Currently   Drug use: Never    History reviewed. No pertinent family history. Allergies  Allergen Reactions   Olmesartan Other (See Comments)    GFR drop   Requip [Ropinirole] Other (See Comments)    Overactive bladder   Valsartan Other (See Comments)    GFR drop     OBJECTIVE: Blood pressure (!) 95/55, pulse 70, temperature (!) 94.2 F (34.6 C), temperature source Oral, resp. rate (!) 28, height _0  (1.575 m), weight 93.1 kg, SpO2 100 %.  Physical Exam Constitutional:      Comments: She is intubated but alert and able to respond with yes no answers to some questions.  Cardiovascular:     Rate and Rhythm: Normal rate and regular rhythm.     Heart sounds: No murmur heard.   Pulmonary:     Breath sounds: Normal breath sounds.  Musculoskeletal:     Comments: She has an Ace wrap on her right lower leg.  She has scattered blisters clear serous fluid.  She has no cellulitis. Her left calf is tight.  Extensive bruising on her right upper extremity.     Lab Results Lab Results  Component Value Date   WBC 28.5 (H) 04/26/2020   HGB 13.0 04/19/2020   HCT 39.0 04/11/2020   MCV 86.1 04/14/2020   PLT 223 04/26/2020    Lab Results  Component Value Date   CREATININE 4.23 (H) 04/16/2020   BUN 60 (H) 05/03/2020   NA 136 04/24/2020   K 5.2 (H) 05/08/2020   CL 106 05/07/2020   CO2 14 (L) 05/08/2020    Lab Results  Component Value Date   ALT 977 (H) 04/29/2020   AST 2,253 (H) 04/29/2020   ALKPHOS 57 04/29/2020   BILITOT 1.4 (H) 04/29/2020     Microbiology: Recent Results (from the past 240 hour(s))  Blood culture (routine x 2)     Status: None (Preliminary result)   Collection Time: 04/27/2020  1:16 PM   Specimen: BLOOD  Result Value Ref Range Status   Specimen Description BLOOD SITE NOT SPECIFIED  Final   Special Requests   Final    BOTTLES DRAWN AEROBIC AND ANAEROBIC Blood Culture results may not be optimal due to an inadequate volume of blood received in culture bottles   Culture   Final    NO GROWTH 3 DAYS Performed at [facility], [place] [address], [place], [place] [address]    Report Status PENDING  Incomplete  Blood culture (routine x 2)     Status: None (Preliminary result)   Collection Time: 04/19/2020  7:32 PM   Specimen:  BLOOD  Result Value Ref Range Status   Specimen Description BLOOD SITE NOT SPECIFIED  Final   Special Requests   Final    BOTTLES DRAWN AEROBIC AND ANAEROBIC Blood Culture results may not be optimal due to an inadequate volume of blood received in culture bottles   Culture   Final    NO GROWTH 3 DAYS Performed at [facility],  [address], [place], [place] [address]    Report Status PENDING  Incomplete  SARS Coronavirus 2 by RT PCR (hospital order, performed in [facility] hospital lab) Nasopharyngeal Nasopharyngeal Swab     Status: None   Collection Time: 04/17/2020  8:19 PM   Specimen: Nasopharyngeal Swab  Result Value Ref Range Status   SARS Coronavirus 2 NEGATIVE NEGATIVE Final    Comment: (NOTE) SARS-CoV-2 target nucleic acids are NOT DETECTED.  The SARS-CoV-2 RNA is generally detectable in upper and lower respiratory specimens during the acute phase of infection. The lowest concentration of SARS-CoV-2 viral copies this assay can detect is 250 copies / mL. A negative result does not preclude SARS-CoV-2 infection and should not be used as the sole basis for treatment or other patient management decisions.  A negative result may occur with improper specimen collection / handling, submission of specimen other than nasopharyngeal swab, presence of viral mutation(s) within the areas targeted by this assay, and inadequate number of viral copies (<250 copies / mL). A negative result must be combined with clinical observations, patient history, and epidemiological information.  Fact Sheet for Patients:   StrictlyIdeas.no  Fact Sheet for Healthcare Providers: BankingDealers.co.za  This test is not yet approved or  cleared by the [place] FDA and has been authorized for detection and/or diagnosis of SARS-CoV-2 by FDA under an Emergency Use Authorization (EUA).  This EUA will remain in effect (meaning this test can be used)  for the duration of the COVID-19 declaration under Section 564(b)(1) of the Act, 21 U.S.C. section 360bbb-3(b)(1), unless the authorization is terminated or revoked sooner.  Performed at [facility], [place] [address], [place], [place] [address]   MRSA PCR Screening     Status: None   Collection Time: 04/19/2020  6:50 AM   Specimen: Nasopharyngeal  Result Value Ref Range Status   MRSA by PCR NEGATIVE NEGATIVE Final    Comment:        The GeneXpert MRSA Assay (FDA approved for NASAL specimens only), is one component of a comprehensive MRSA colonization surveillance program. It is not intended to diagnose MRSA infection nor to guide or monitor treatment for MRSA infections. Performed at [facility], [place] [address], [place], [place] [address]     Michel Bickers, [facility] [phone] pager   [phone] cell 05/08/2020, 10:26 AM

## 2020-05-01 NOTE — Progress Notes (Addendum)
  Progress Note    05/10/2020 7:55 AM 1 Day Post-Op  Subjective:  On ventilator   Vitals:   04/27/2020 0645 04/29/2020 0700  BP:    Pulse:    Resp: (!) 23 (!) 26  Temp:    SpO2:     Physical Exam: Cardiac: regular Lungs: on vent  Extremities:  Left lower extremity warm. Doppler DP/ PT signals. Right lower extremity Dressed. Dressings saturated some over night. No audible Doppler signals in right foot. Does warm. Bilateral lower extremities edematous Abdomen: obese, soft Neurologic: on ventilator , mild sedation. Responds to verbal stimulous  CBC    Component Value Date/Time   WBC 28.5 (H) 04/29/2020 0531   RBC 4.53 04/24/2020 0531   HGB 13.0 05/03/2020 0531   HCT 39.0 05/09/2020 0531   PLT 223 04/13/2020 0531   MCV 86.1 04/13/2020 0531   MCH 28.7 04/18/2020 0531   MCHC 33.3 05/05/2020 0531   RDW 14.7 04/23/2020 0531   LYMPHSABS 0.3 (L) 04/29/2020 1139   MONOABS 1.7 (H) 04/12/2020 1139   EOSABS 0.0 04/14/2020 1139   BASOSABS 0.0 04/25/2020 1139    BMET    Component Value Date/Time   NA 136 04/11/2020 0531   K 5.2 (H) 04/18/2020 0531   CL 106 04/27/2020 0531   CO2 14 (L) 04/11/2020 0531   GLUCOSE 100 (H) 04/11/2020 0531   BUN 60 (H) 04/30/2020 0531   CREATININE 4.23 (H) 05/02/2020 0531   CALCIUM 6.8 (L) 05/04/2020 0531   GFRNONAA 10 (L) 05/05/2020 0531   GFRAA 12 (L) 04/14/2020 0531    INR    Component Value Date/Time   INR 1.1 05/02/2020 1754     Intake/Output Summary (Last 24 hours) at 04/25/2020 0755 Last data filed at 04/27/2020 0700 Gross per 24 hour  Intake 6023.24 ml  Output 2777 ml  Net 3246.24 ml     Assessment/Plan:  64 y.o. female is s/p Right lower extremity thromboembolectomy of the popliteal, anterior tibial and posterior tibial arteries. Right lower extremity 4 compartment fasciotomies  1 Day Post-Op. Right foot with good capillary refill. Right toes warm. No audible doppler signals in right foot  Right lower extremity dressing take  down later today by Dr. Jeri Lager, PA-C [facility] [phone] 05/02/2020 7:55 AM   I have independently interviewed and examined patient and agree with PA assessment and plan above.  Unfortunately patient requiring vasopressors recently had A. fib with RVR on continuous dialysis.  I discussed with the patient and her daughter proceeding with right above-knee amputation.  She also has very tight compartments in her left lower extremity now pulses are no longer palpable as they were easily yesterday she does have a dorsalis pedis signal.  She does not appear to be able to move the foot and does appear to be wincing with passive motion today.  We will proceed with left lower extremity 4 compartment fasciotomies.  I do not think this is an embolic phenomenon given the lack of clot retrieved yesterday and presence of dorsalis pedis signal which is strong.  Britnie Colville C. Donzetta Matters, MD [facility] Office: [phone] Pager: [phone]

## 2020-05-01 NOTE — Progress Notes (Signed)
Pharmacy Antibiotic Note  [name] is a 64 y.o. 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  Pharmacy has been consulted for Vanc and Cefepime dosing. Acute on chronic renal failure, CRRT initiated overnight.  Plan: Vanc 2 grams IV x 1 today followed by 1 gram IV q24hr while on CRRT Cefepime 2 grams q12hrs while on CRRT Monitor dialysis, renal function, clinical status, C&S and vanc levels as needed  Height: 5\' 2"  (157.5 cm) Weight: 93.1 kg (205 lb 4 oz) IBW/kg (Calculated) : 50.1  Temp (24hrs), Avg:96.5 F (35.8 C), Min:94.2 F (34.6 C), Max:98.1 F (36.7 C)  Recent Labs  Lab 04/24/2020 1139 04/20/2020 1636 04/16/2020 1932 04/27/2020 1933 04/29/20 0215 04/29/20 1027 05/08/2020 0233 04/15/2020 1047 05/08/2020 1214 05/04/2020 1754 04/15/2020 2150 04/13/2020 0325 04/22/2020 0531  WBC 28.1*  --   --   --  27.0*  --   --  20.9*  --   --  24.0*  --  28.5*  CREATININE 1.95*  --   --    < > 3.32*   < >   < >  --  6.09* 6.73* 6.63* 4.81* 4.23*  LATICACIDVEN  --  2.1* 3.1*  --   --   --   --   --   --   --   --   --  5.5*   < > = values in this interval not displayed.    Estimated Creatinine Clearance: 14.1 mL/min (A) (by C-G formula based on SCr of 4.23 mg/dL (H)).    Allergies  Allergen Reactions  . Olmesartan Other (See Comments)    GFR drop  . Requip [Ropinirole] Other (See Comments)    Overactive bladder  . Valsartan Other (See Comments)    GFR drop    Antimicrobials this admission: Vanc 6/21 >>  Cefepime 6/21 >>   Thank you for allowing pharmacy to be a part of this patient's care.  Alanda Slim, PharmD, [facility] Clinical Pharmacist Please see AMION for all Pharmacists' Contact Phone Numbers 04/18/2020, 7:44 AM

## 2020-05-01 NOTE — Progress Notes (Signed)
Pt initially Aox3 on arrival to PACU on simple mask. O2 saturations started dropping, pt became less responsive. Pt diaphoretic. CBG 201. BP stable at this time. NSR. Anesthesia at bedside, BIPAP initiated.

## 2020-05-01 NOTE — Progress Notes (Signed)
PT Cancellation Note  Patient Details Name: [name] MRN: [id number] DOB: Apr 04, 1956   Cancelled Treatment:    Reason Eval/Treat Not Completed: Medical issues which prohibited therapy.  Per RN, pt going/gone to surgery for amputation and/or fasciotomy. 05/05/2020  [name], PT Acute Rehabilitation Services [phone]  (pager) [phone]  (office)   [name] [name] 04/22/2020, 1:20 PM

## 2020-05-01 NOTE — Transfer of Care (Signed)
Immediate Anesthesia Transfer of Care Note  Patient: [name]  Procedure(s) Performed: RIGHT ABOVE KNEE AMPUTATION (Right Knee) FOUR COMPARTMENTAL FASCIOTOMY OF LEFT LOWER EXTREMITY (Left Leg Lower)  Patient Location: ICU  Anesthesia Type:General  Level of Consciousness: patient cooperative and Patient remains intubated per anesthesia plan  Airway & Oxygen Therapy: Patient remains intubated per anesthesia plan and Patient placed on Ventilator (see vital sign flow sheet for setting)  Post-op Assessment: Report given to RN and Post -op Vital signs reviewed and stable  Post vital signs: Reviewed and stable  Last Vitals:  Vitals Value Taken Time  BP    Temp    Pulse    Resp    SpO2      Last Pain:  Vitals:   05/10/2020 1200  TempSrc: Oral  PainSc:       Patients Stated Pain Goal: 2 (91/50/41 3643)  Complications: No complications documented.

## 2020-05-01 NOTE — Progress Notes (Signed)
RT note-Patient back from the OR, remains on current charted settings

## 2020-05-01 NOTE — Progress Notes (Signed)
CRITICAL VALUE ALERT  Critical Value:  Lactic acid 5.5   Date & Time Notied:  04/17/2020 6:15 AM   Provider Notified: Gillermina Phy, MD  Orders Received/Actions taken: Acknowledged, awaiting orders

## 2020-05-01 NOTE — Plan of Care (Signed)
Spoke to daughter Dia Crawford [phone]) to provide an update.  Julian Hy, DO 04/16/2020 9:29 AM [facility]

## 2020-05-01 NOTE — Anesthesia Postprocedure Evaluation (Signed)
Anesthesia Post Note  Patient: [name]  Procedure(s) Performed: RIGHT ABOVE KNEE AMPUTATION (Right Knee) FOUR COMPARTMENTAL FASCIOTOMY OF LEFT LOWER EXTREMITY (Left Leg Lower)     Patient location during evaluation: SICU Anesthesia Type: General Level of consciousness: sedated Pain management: pain level controlled Vital Signs Assessment: post-procedure vital signs reviewed and stable Respiratory status: patient remains intubated per anesthesia plan Cardiovascular status: stable Postop Assessment: no apparent nausea or vomiting Anesthetic complications: no   No complications documented.  Last Vitals:  Vitals:   05/07/2020 2000 04/22/2020 2030  BP:  (!) 111/58  Pulse: 71 71  Resp: (!) 26 (!) 26  Temp:  (!) 34 C  SpO2: 100% 100%    Last Pain:  Vitals:   05/09/2020 2030  TempSrc: Axillary  PainSc:                  [name]

## 2020-05-01 NOTE — Progress Notes (Addendum)
[facility] Progress Note   Assessment/ Plan:   1. AoCKD 3- b/l creat 1.3 in March 2021, eGFR 46. Was on the floor for undetermined amount of time at home yesterday and presenting w/ AKI , oliguric w/ CPK >50,000 indicating significant rhabdomyolysis. Renal US w/o hydro. On CRRT, started last night d/t postop K of 7.3, resp distress, intubation.  No heparin, on low K fluids, serial K--> better at 5.2 this AM   2. Acute rhabdomyolysis and critical limb ischemia--> s/p R lower extremity thromboembolectomy of popliteal, anterior tibial and posterior tibial arteries and 4 compartment fasciotomy  3. Acute blood loss anemia: received 4 u pRBCs in OR  4. Hyperkalemia -cont lokelma tid for now and low K fluids in CRRt  5. Shock: hemorrhagic +/- septic--> on pressor per PCCM, ID consulted  6. Acute hypoxic RF: intubated, per PCCM  7. Dispo: in ICU  Subjective:    Started CRRT last night.  Intubated, says she's hot.  Off all sedation.     Objective:   BP (!) 95/55   Pulse 70   Temp (!) 94.2 F (34.6 C) (Oral)   Resp (!) 28   Ht 5' 2"  (1.575 m)   Wt 93.1 kg   SpO2 100%   BMI 37.54 kg/m   Physical Exam: Gen: intubated, NAD CVS: RRR Resp: mech bilaterally Abd: soft Ext: R leg wrapped, toes blue and cold, no pulse, L great toe cool as well as cool on bottom of foot ACCESS: R IJ nontunneled HD cath  Labs: BMET Recent Labs  Lab 04/29/20 1027 04/29/20 1027 04/29/20 1744 04/29/20 1744 04/25/2020 0233 04/25/2020 0233 05/08/2020 1214 04/12/2020 1754 04/13/2020 1959 04/27/2020 2150 04/25/2020 0000 04/21/2020 0325 04/12/2020 0531  NA 137   < > 135   < > 132*  --  134* 132* 134* 134* 137  --  136  K QUANTITY NOT SUFFICIENT, UNABLE TO PERFORM TEST   < > 5.7*   < > 5.2*   < > 6.3* 6.5* 4.7 7.3* 5.6* 5.2* 5.2*  CL 108   < > 107  --  103  --  105 104 102 107  --   --  106  CO2 12*  --  19*  --  16*  --  17* 13*  --  11*  --   --  14*  GLUCOSE 101*   < > 116*  --  129*  --  111*  145* 124* 184*  --   --  100*  BUN 63*   < > 68*  --  79*  --  88* 94* 101* 88*  --   --  60*  CREATININE 3.54*   < > 4.37*   < > 5.27*  --  6.09* 6.73* 7.10* 6.63*  --  4.81* 4.23*  CALCIUM 6.7*  --  6.6*  --  6.5*  --  6.5* 6.6*  --  5.7*  --   --  6.8*  PHOS  --   --   --   --  9.7*  --   --   --   --   --   --   --  9.6*   < > = values in this interval not displayed.   CBC Recent Labs  Lab 04/21/2020 1139 05/02/2020 1816 04/29/20 0215 04/29/20 0215 04/15/2020 1047 05/08/2020 1047 04/11/2020 1959 04/25/2020 2150 04/26/2020 0000 05/07/2020 0531  WBC 28.1*  --  27.0*  --  20.9*  --   --  24.0*  --  28.5*  NEUTROABS 26.1*  --   --   --   --   --   --   --   --   --   HGB 15.1*   < > 13.8   < > 11.0*   < > 9.2* 4.2* 5.4* 13.0  HCT 48.6*   < > 43.1   < > 34.6*   < > 27.0* 14.4* 16.0* 39.0  MCV 88.0  --  86.9  --  89.2  --   --  94.7  --  86.1  PLT 352  --  275  --  236  --   --  189  --  223   < > = values in this interval not displayed.      Medications:    . chlorhexidine gluconate (MEDLINE KIT)  15 mL Mouth Rinse BID  . Chlorhexidine Gluconate Cloth  6 each Topical Daily  . insulin aspart  5 Units Intravenous Once   And  . dextrose  1 ampule Intravenous Once  . docusate  100 mg Oral BID  . feeding supplement (PRO-STAT SUGAR FREE 64)  30 mL Per Tube BID  . feeding supplement (VITAL HIGH PROTEIN)  1,000 mL Per Tube Q24H  . heparin  5,000 Units Subcutaneous Q8H  . insulin aspart  0-15 Units Subcutaneous TID WC  . mouth rinse  15 mL Mouth Rinse 10 times per day  . pantoprazole sodium  40 mg Per Tube Daily  . phenylephrine      . polyethylene glycol  17 g Oral Daily  . rocuronium  50 mg Intravenous Once  . sodium bicarbonate  50 mEq Intravenous Once  . sodium chloride flush  10-40 mL Intracatheter Q12H  . sodium zirconium cyclosilicate  10 g Oral TID     Madelon Lips MD 04/20/2020, 10:43 AM

## 2020-05-01 NOTE — Progress Notes (Signed)
Pt responding to vent settings. No sedation needed/neuro response. Awaiting ICU bed, CCM and anesthesia at bedside.

## 2020-05-01 NOTE — Anesthesia Preprocedure Evaluation (Addendum)
Anesthesia Evaluation  Patient identified by MRN, date of birth, ID band Patient unresponsive    Reviewed: Allergy & Precautions, H&P , NPO status , Patient's Chart, lab work & pertinent test results  Airway Mallampati: Intubated       Dental   Pulmonary neg pulmonary ROS,     + decreased breath sounds      Cardiovascular hypertension, + Peripheral Vascular Disease   Rhythm:Regular  RLE ischemia   Neuro/Psych  Neuromuscular disease    GI/Hepatic   Endo/Other  obese  Renal/GU ARFRenal diseaseK 6.3 today at 12:14.  Lokelma 10g given at 5pm. 10 Units insulin(&glucose) given 5:20pm.       Musculoskeletal   Abdominal   Peds  Hematology   Anesthesia Other Findings   Reproductive/Obstetrics                            Anesthesia Physical  Anesthesia Plan  ASA: IV  Anesthesia Plan: General   Post-op Pain Management:    Induction: Intravenous  PONV Risk Score and Plan: 3 and Ondansetron, Dexamethasone and Treatment may vary due to age or medical condition  Airway Management Planned: Oral ETT  Additional Equipment:   Intra-op Plan:   Post-operative Plan: Post-operative intubation/ventilation  Informed Consent: I have reviewed the patients History and Physical, chart, labs and discussed the procedure including the risks, benefits and alternatives for the proposed anesthesia with the patient or authorized representative who has indicated his/her understanding and acceptance.     History available from chart only  Plan Discussed with: Anesthesiologist, CRNA and Surgeon  Anesthesia Plan Comments:        Anesthesia Quick Evaluation

## 2020-05-01 NOTE — Op Note (Addendum)
OPERATIVE NOTE  Date: May 01, 2020  PROCEDURE: 1. Right above-the-knee amputation 2. Left lower extremity four compartment fasciotomies  PRE-OPERATIVE DIAGNOSIS:  1.  Nonviable right lower extremity after right lower extremity thrombectomy and 4 compartment fasciotomies 2.  Left lower extremity compartment syndrome  POST-OPERATIVE DIAGNOSIS: same as above  SURGEON: Marty Heck, MD  ASSISTANT(S): Leontine Locket, PA  ANESTHESIA: general  ESTIMATED BLOOD LOSS: 250 mL  FINDING(S): Left lower extremity 4 compartment fasciotomies were performed.  The muscle in the anterior and lateral compartment appeared viable.  There was much more tension in the deep posterior and superficial posterior compartments.  The muscle in the deep posterior compartments as well as the soleus all appeared marginal and partially ischemic with minimal contractility.  Left DP brisk by doppler and PT by doppler at completion of the case. Right above-knee amputation performed with viable tissue margins.  SPECIMEN(S):  right above-the-knee amputation  INDICATIONS:   [name] is a 64 y.o. female who presents with non-viable right lower extremity after thrombectomy and right lower extremity 4 compartment fasciotomies yesterday.  Patient has remained critically ill in the ICU with pressor requirements and rising lactic acid.  She presents for planned right lower extremity above-knee amputation.  Also discussed plan for left lower extremity 4 compartment fasciotomies given concern for compartment syndrome on exam even though she still has a brisk dorsalis pedis signal in the left foot.  Risk and benefits have discussed been discussed in detail with the family by Dr. Donzetta Matters.  DESCRIPTION: Patient was brought to the operating room after informed consent was obtained.  She was placed on operative table in the supine position.  Bilateral lower legs were then prepped and draped in usual sterile fashion.  A  preop timeout was performed identify patient, procedure and site.  Patient did get preoperative antibiotics.  Initially started with left lower extremity and initially went lateral on the calf and made a longitudinal incision 2 fingerbreadths lateral to the tibia.  Ultimately subcutaneous tissue was opened with Bovie cautery until I encountered the underlying fascia.  This was opened transversely to identify the anterior lateral compartments and identifying the septum in the fascia.  With Metzenbaum scissors both the anterior and lateral compartments were then opened throughout their length from just above the ankle up to just below the knee joint with Metzenbaum scissors.  The muscle in the anterior lateral compartment appeared viable and contracted when stimulated by Bovie cautery.  Then turned my attention medially and a longitudinal incision was made 2 fingerbreadth lateral to the tibia on the medial side of the calf.  Ultimately open the subcutaneous tissue with Bovie cautery and then the underlying muscle fascia was opened entering the superficial posterior compartment.  Became apparent that the gastrocnemius look viable but the soleus appeared very pale and ischemic and had very poor response to cautery.  I then took down the soleus off the tibia to enter the deep posterior compartment and ultimately the fascia was taken down throughout the entire length of the incision.  Most of the muscle in the deep compartment also appeared ischemic and pale and poorly contractile.  I then turned my attention to the right leg.  I marked out the anterior and posterior flaps for a fish-mouth type of amputation. I made the incisions for these flaps, and then dissected through the subcutaneous tissue, fascia, and muscles circumferentially.  I elevated  the periosteal tissue 4 cm more proximal than the anterior skin  flap.  I then transected the femur with a power saw at this level.   The femoral vessels were identified and  clamped between large Kelly clamps and divided.  I then completed the posterior flap with Bovie cautery while using a bone hook.  At this point, the specimen was passed off the field as the above-the-knee amputation.  At this point, I clamped all visibly bleeding arteries and veins using a combination of suture ligation with Vicryl suture and electrocautery.  The femoral vessels were then ligated with a 2-0 silk suture as well as a 2-0 Vicryl suture.  Bleeding continued to be controlled with electrocautery and suture ligature.  The stump was washed off with sterile normal saline and no further active bleeding was noted.  I reapproximated the anterior and posterior fascia  with interrupted stitches of 2-0 Vicryl.  This was completed along the entire length of anterior and posterior fascia until there were no more loose space in the fascial line.  The skin was then  reapproximated with staples.  The stump was washed off and dried.  The incision was dressed with [employer] and  then fluffs were applied.  Kerlix was wrapped around the leg and then gently an ACE wrap was applied.   The fasciotomy incisions were then irrigated out and packed with moist Kerlix in each wound and then wrapped with a dry Kerlix and sterile Ace bandage.  Patient had dopplerable dorsalis pedis signal that was very brisk as well as a posterior tibial signal that we could identify in the left foot.  Taken back to ICU in critical condition.   COMPLICATIONS: None  CONDITION: Critical   Marty Heck, MD [facility]: [phone]  04/29/2020, 2:25 PM

## 2020-05-01 NOTE — Progress Notes (Signed)
NAME:  [name], MRN:  [id number], DOB:  [birth year], LOS: 3 ADMISSION DATE:  04/18/2020, CONSULTATION DATE:  04/22/2020 REFERRING MD:  Anesthesia, CHIEF COMPLAINT:  Respiratory failure   Brief History   64 y.o. F who fell on 6/18 and developed Rhabdo and worsening RLE pain, taken to the OR on 6/20 for thromboembolectomy and fasciotomy, was extubated and then had to be re-intubated and found to be profoundly anemic.  PCCM consulted for transfer to ICU.   History of present illness   64 y.o. F with PMH HTN, CKD and RLS who was initially admitted 6/18 after rolling out bed and being down on the floor for several hours. She was treated for rhabdo with IVF and admitted, later developed mottling of the RLE and taken to the OR 6/20 for thromboembolectomy and compartment fasciotomies. Post-op was extubated and awake, then developed worsening respiratory status and was re-intubated. Labs returned with Hgb 4.2, K 7.3, pH 7.0 and pt requiring Neo gtt.    Per report, EBL 1L intraoperatively.  Patient was given 2 A bicarb, calcium, insulin, 4 units PRBC ordered and transferred to the intensive care unit for the initiation of CRRT.  Past Medical History   has a past medical history of Balance disorder, Hypertension, and Renal disorder.  Significant Hospital Events   6/18 Admit to internal medicine 6/20 > RLE thromboembolectomy and fasciotomy, transfer to ICU  Consults:  Vascular surgery PCCM nephrology  Procedures:  ETT 6/20   Significant Diagnostic Tests:  6/18 CT head>> no acute findings 6/18 MRI brain>>unremarkable  Micro Data:  6/18 blood cultures x2>> 6/18 SARS-Covid-2>> negative  Antimicrobials:  Ancef 6/20 Cefepime 6/21> vanc 6/21> clinda 6/21>  Interim history/subjective:  Intubated overnight for severe acidosis, encephalopathy. Answering questions this morning- she has had LE blisters since coming to the hospital, not prior.    Objective   Blood pressure (!) 95/55,  pulse 70, temperature (!) 94.2 F (34.6 C), temperature source Oral, resp. rate (!) 28, height 5\' 2"  (1.575 m), weight 93.1 kg, SpO2 100 %.    Vent Mode: PRVC FiO2 (%):  [35 %-100 %] 35 % Set Rate:  [20 bmp-26 bmp] 26 bmp Vt Set:  [400 mL] 400 mL PEEP:  [5 cmH20] 5 cmH20 Plateau Pressure:  [13 cmH20-15 cmH20] 15 cmH20   Intake/Output Summary (Last 24 hours) at 04/21/2020 9767 Last data filed at 04/19/2020 0700 Gross per 24 hour  Intake 6023.24 ml  Output 2777 ml  Net 3246.24 ml   Filed Weights   04/22/2020 0100 05/02/2020 0000 05/03/2020 0500  Weight: 88.5 kg 90.5 kg 93.1 kg   General: Ill-appearing woman in NAD, appears stated age. HEENT: [place]/AT, eyes anicteric. Tracking with L eye, blind in R eye Neuro: globally weak, moving arms, answering y/n questions, attempting to talk around ETT. CV: S1S2, RRR PULM: CTAB, breathing above the vent. GI: soft, NT, minimally distended Extremities: warm, bandaged RLE with bleeding Skin: Thin walled blisters and areas with denuded skin only on legs. No ecchymoses.   Resolved Hospital Problem list     Assessment & Plan:   Acute blood loss anemia, hemorrhagic shock post RLE thrombectomy and fasciotomy post-op Hb 4.2, improved after 4 unit pRBCs -follow serial CBCs -transfuse for hemodynamically significant bleeding, Hb <7  Shock- hemorrhagic likely with a component of septic shock -vasopressors as required to maintain MAP >65 -escalate antibiotics -serial CBCs -serial LAs  Compartment syndrome RLE; no pulses on exam despite fasciotomy -RLE thrombosis, critical limb ischemia -echo  to evaluate for intracardiac thrombus as a source of thromboembolism  Acute on chronic renal failure with severe metabolic acidosis  Hyperphosphatemia, hyperkalemia Lactic acidosis Rhabdo -CRRT per nephrology recommendations; appreciate their assistance -foley to monitor I/Os -monitor electrolytes  Acute hypoxic respiratory failure due to severe acidosis,  encephalopathy requiring MV. Reintubated after respiratory decompemsation in PACU. -con't LTVV; goal Pplat <30 and driving pressure <23 -titrate PEEP & FiO2 per ARDS protocol -will need cuff leak trial prior to extubation (multiple intubations) -not a candidate for SBT today due to instability; SAT & SBT when appropriate -VAP prevention protocol -fentanyl PRN for sedation  Acute metabolic encephalopathy -fentanyl PRN for pain -frequent reorientation, delirium precautions  Skin bullae, concern for relation to sepsis, possibly TSS vs SSS -escalate antibiotics -ID consult -con't to follow cultures  Hyperglycemia, likely 2/2 critical illness -SSI PRN Q4h -goal BG 140-180 while in ICU if requiring insulin  Unable to contact family as no numbers available- daughter's number not in chart. Listed relative in family contacts is deceased per patient.   Best practice:  Diet: N.p.o. Pain/Anxiety/Delirium protocol (if indicated): Fentanyl/propofol VAP protocol (if indicated): HOB 30 degrees, suction as needed DVT prophylaxis: Heparin GI prophylaxis: Protonix Glucose control: SSI Mobility: Bedrest Code Status: Full code Family Communication: daughter- need phone number Disposition: ICU  Labs   CBC: Recent Labs  Lab 04/19/2020 1139 05/02/2020 1816 04/29/20 0215 04/18/2020 1047 05/08/2020 2150 05/05/2020 0000 04/25/2020 0531  WBC 28.1*  --  27.0* 20.9* 24.0*  --  28.5*  NEUTROABS 26.1*  --   --   --   --   --   --   HGB 15.1*   < > 13.8 11.0* 4.2* 5.4* 13.0  HCT 48.6*   < > 43.1 34.6* 14.4* 16.0* 39.0  MCV 88.0  --  86.9 89.2 94.7  --  86.1  PLT 352  --  275 236 189  --  223   < > = values in this interval not displayed.    Basic Metabolic Panel: Recent Labs  Lab 04/26/2020 0233 04/19/2020 0233 04/19/2020 1214 04/25/2020 1214 04/29/2020 1754 05/09/2020 2150 04/24/2020 0000 04/20/2020 0325 04/12/2020 0531  NA 132*   < > 134*  --  132* 134* 137  --  136  K 5.2*   < > 6.3*   < > 6.5* 7.3* 5.6*  5.2* 5.2*  CL 103  --  105  --  104 107  --   --  106  CO2 16*  --  17*  --  13* 11*  --   --  14*  GLUCOSE 129*  --  111*  --  145* 184*  --   --  100*  BUN 79*  --  88*  --  94* 88*  --   --  60*  CREATININE 5.27*   < > 6.09*  --  6.73* 6.63*  --  4.81* 4.23*  CALCIUM 6.5*  --  6.5*  --  6.6* 5.7*  --   --  6.8*  MG  --   --   --   --   --   --   --   --  2.4  PHOS 9.7*  --   --   --   --   --   --   --  9.6*   < > = values in this interval not displayed.   GFR: Estimated Creatinine Clearance: 14.1 mL/min (A) (by C-G formula based on SCr of 4.23 mg/dL (H)).  Recent Labs  Lab 05/09/2020 1139 04/20/2020 1636 04/17/2020 1932 04/29/20 0215 04/29/2020 1047 05/09/2020 2150 04/25/2020 0531  WBC   < >  --   --  27.0* 20.9* 24.0* 28.5*  LATICACIDVEN  --  2.1* 3.1*  --   --   --  5.5*   < > = values in this interval not displayed.    Liver Function Tests: Recent Labs  Lab 05/10/2020 1139 04/29/20 0215 04/23/2020 0531  AST 371* 2,253*  --   ALT 155* 977*  --   ALKPHOS 74 57  --   BILITOT 1.7* 1.4*  --   PROT 8.0 5.4*  --   ALBUMIN 4.2 2.4* 2.6*   Recent Labs  Lab 04/29/2020 1139  LIPASE 29   Recent Labs  Lab 04/13/2020 1636  AMMONIA 22    ABG    Component Value Date/Time   PHART 7.231 (L) 04/24/2020 0000   PCO2ART 30.8 (L) 04/18/2020 0000   PO2ART 282 (H) 05/07/2020 0000   HCO3 13.1 (L) 04/19/2020 0000   TCO2 14 (L) 05/04/2020 0000   ACIDBASEDEF 13.0 (H) 04/19/2020 0000   O2SAT 100.0 04/21/2020 0000     Coagulation Profile: Recent Labs  Lab 05/05/2020 1754  INR 1.1    Cardiac Enzymes: Recent Labs  Lab 04/14/2020 1933 04/29/20 0215 04/29/20 1744 04/12/2020 0233 04/25/2020 0531  CKTOTAL >50,000* >50,000* >50,000* >50,000* >50,000*    HbA1C: Hgb A1c MFr Bld  Date/Time Value Ref Range Status  04/29/2020 02:15 AM 6.3 (H) 4.8 - 5.6 % Final    Comment:    (NOTE) Pre diabetes:          5.7%-6.4%  Diabetes:              >6.4%  Glycemic control for   <7.0% adults with  diabetes     CBG: Recent Labs  Lab 04/21/2020 1216 04/25/2020 1742 04/26/2020 2122 04/11/2020 0016 04/20/2020 0712  GLUCAP 98 159* 203* 87 64*    This patient is critically ill with multiple organ system failure which requires frequent high complexity decision making, assessment, support, evaluation, and titration of therapies. This was completed through the application of advanced monitoring technologies and extensive interpretation of multiple databases. During this encounter critical care time was devoted to patient care services described in this note for 50 minutes.  Julian Hy, DO 04/14/2020 8:23 AM [facility]

## 2020-05-01 NOTE — Progress Notes (Signed)
PHARMACIST LIPID MONITORING   [name] is a 64 y.o. female admitted on 05/03/2020 with rhabdomyolysis and noted w/ R leg ischemia s/p OR  Pharmacy has been consulted to optimize lipid-lowering therapy with the indication of secondary prevention for clinical ASCVD.  Recent Labs:  Lipid Panel (last 6 months):   Lab Results  Component Value Date   CHOL 157 04/29/2020   TRIG 97 04/17/2020   HDL 56 04/29/2020   CHOLHDL 2.8 04/29/2020   VLDL 17 04/29/2020   LDLCALC 84 04/29/2020    Hepatic function panel (last 6 months):   Lab Results  Component Value Date   AST 2,253 (H) 04/29/2020   ALT 977 (H) 04/29/2020   ALKPHOS 57 04/29/2020   BILITOT 1.4 (H) 04/29/2020    SCr (since admission):   Serum creatinine: 4.23 mg/dL (H) 04/27/2020 0531 Estimated creatinine clearance: 14.1 mL/min (A)  Current lipid-lowering therapy: none Previous lipid-lowering therapies (if applicable): none noted Documented or reported allergies or intolerances to lipid-lowering therapies (if applicable): none  Assessment:  Patient is excluded from the protocol due to CRRT, elevated LTs (ESRD, elevated LFTs, pregnancy/breastfeeding, active liver disease)  Recommendation per protocol: No changes   Plan: -No lipid therapy now but may benefit later -Will reassess when more stable  Hildred Laser, PharmD Clinical Pharmacist **Pharmacist phone directory can now be found on [email or web address] ([email or web address]).  Listed under [place].

## 2020-05-01 NOTE — Progress Notes (Signed)
[facility] Progress Note Patient Name: [name] DOB: 10-09-1956 MRN: [id number]   Date of Service  04/24/2020  HPI/Events of Note  52F who presented to [facility] with an ischemic right leg and who underwent thrombectomy today in the OR with vascular surgery. She was extubated post-op in the PACU but then required reintubation for hypoxemic respiratory failure which appears to have occurred in the context of severe acidosis, itself due to a combination of hemorrhagic shock (Hgb was 4.2), severe rhabdomyolysis (CK > 50,000) c/b anuric renal failure, and ischemic-reperfusion injury.  Her most emergent problems right now are her severe acidosis (BE -13.0), severe hyperkalemia (K 7.3), and acute anemia.  She is hypotensive, requiring phenylephrine drip to maintain her MAP. She has received 2 of the 4 units of blood ordered for her. She is about to start on CRRT for management of her hyperkalemia.   eICU Interventions  # Neuro: - Fentanyl/propofol drips  # Respiratory: - Mechanically ventilated. Will increase RR slightly to better compensate for her metabolic acidosis until CRRT can begin. She is oxygenating well.  # Cardiac: - Suspect that phenylephrine will be weaned quickly once blood is transfused and acidosis is corrected. - If ongoing pressor need after completion of blood, would consider switching to levophed.  # Heme: - Acute anemia likely as a result of operative blood loss. - Transfuse to Hgb > 7.  # Renal: - Start CRRT for acidosis and hyperkalemia and rhabdomyolysis. - Temporize hyperkalemia with insulin/dextrose, bicarb and calcium.  # DVT PPX: Hold heparin given acute anemia and post-op state. # GI PPX: Protonix      Intervention Category Evaluation Type: New Patient Evaluation  Charlott Rakes 05/03/2020, 12:21 AM

## 2020-05-01 NOTE — Plan of Care (Signed)
Followed up after OR for R AKA and LLE fasciotomy. Post-op Hb down 4g. Lactate downtrending to 6.9.  Plan: Transfuse 2 units pRBCs, 1 g Ca+ Recheck midnight labs BMP at midnight Remain on bicarb infusion overnight with CRRT unless drastic improvement in metabolic profile.  Julian Hy, DO 04/14/2020 5:57 PM [facility]

## 2020-05-01 NOTE — Anesthesia Postprocedure Evaluation (Signed)
Anesthesia Post Note  Patient: [name]  Procedure(s) Performed: THROMBECTOMY RIGHT POPLITEAL ARTERY; RIGHT ANTERIOR TIBIAL ARTERY AND RIGHT POSTERIOR TIBIAL ARTERY; RIGHT LOWER EXTRMEMITY FOUR COMPARTMENT FASCIOTOMIES (Right )     Patient location during evaluation: PACU Anesthesia Type: General Level of consciousness: sedated Pain management: pain level controlled Vital Signs Assessment: vitals unstable Respiratory status: patient re-intubated (Respiratory status deteriorated in PACU requiring re-intubation) Cardiovascular status: blood pressure returned to baseline and stable Postop Assessment: no apparent nausea or vomiting Anesthetic complications: no Comments: Pt required re-intubation in PACU due to poor respiratory effort/shallow tidal volumes.  Pt was very acidotic from ischemic RLE.  Hgb intra-op was measured 9.2, but subsequently found to be 4.2 in the PACU.  The anemia and other electrolyte abnormalities were addressed immediately in the PACU.  Critical care was consulted and also at bedside.     No complications documented.  Last Vitals:  Vitals:   04/27/2020 0530 04/25/2020 0545  BP:    Pulse: 68   Resp: (!) 28 (!) 23  Temp:    SpO2: 100% 100%    Last Pain:  Vitals:   05/02/2020 0343  TempSrc: Oral  PainSc:                  [place]

## 2020-05-01 NOTE — Plan of Care (Signed)
Going back to OR for fasciotomy on L and amputation on R. Will run bicarb infusion while off CRRT for metabolic acidosis.  11 AM follow up labs pending.  Julian Hy, DO 05/09/2020 11:48 AM [facility]

## 2020-05-01 NOTE — Progress Notes (Signed)
Initial Nutrition Assessment  DOCUMENTATION CODES:   Not applicable  INTERVENTION:   After procedure, initiate tube feeding via OG tube: Vital AF 1.2 at 50 ml/h (1200 ml per day) Pro-stat 30 ml BID  Provides 1640 kcal, 120 gm protein, 973 ml free water daily.  Add B-complex with vitamin C while on CRRT.   NUTRITION DIAGNOSIS:   Increased nutrient needs related to wound healing, acute illness as evidenced by estimated needs.  GOAL:   Patient will meet greater than or equal to 90% of their needs  MONITOR:   Vent status, Labs, TF tolerance, Skin, I & O's  REASON FOR ASSESSMENT:   Ventilator, Consult Enteral/tube feeding initiation and management  ASSESSMENT:   64 yo female admitted with rhabdomyolysis. PMH includes CKD, HTN, balance disorder.   6/20 - S/P R lower extremity thromboembolectomy of popliteal, anterior tibial and posterior tibial arteries and 4 compartment fasciotomy.  Discussed patient in ICU rounds and with RN today. OG tube in place. Received MD Consult for TF initiation and management. Returning to the OR today for R AKA and LLE fasciotomy.  Patient is currently intubated on ventilator support MV: 10.5 L/min Temp (24hrs), Avg:96.6 F (35.9 C), Min:94.2 F (34.6 C), Max:98 F (36.7 C)   Labs reviewed. K 5.2 (H), BUN 60 (H), creat 4.23 (H), phos 9.6 (H) CBG: 26-84  Medications reviewed and include novolog, colace, epinephrine, miralax, lokelma, levophed, calcium gluconate, phenylephrine, vasopressin.  Weight 83.6 kg on 6/19, up to 93.1 kg today with positive volume status. I/O +6.3 L  CRRT initiated 6/20.   NUTRITION - FOCUSED PHYSICAL EXAM:    Most Recent Value  Orbital Region No depletion  Upper Arm Region No depletion  Thoracic and Lumbar Region No depletion  Buccal Region Unable to assess  Temple Region Unable to assess  Clavicle Bone Region No depletion  Clavicle and Acromion Bone Region No depletion  Scapular Bone Region Unable  to assess  Dorsal Hand No depletion  Patellar Region No depletion  Anterior Thigh Region No depletion  Posterior Calf Region No depletion  Edema (RD Assessment) Moderate  Hair Reviewed  Eyes Unable to assess  Mouth Unable to assess  Skin Reviewed  Nails Reviewed       Diet Order:   Diet Order            Diet NPO time specified  Diet effective now                 EDUCATION NEEDS:   Not appropriate for education at this time  Skin:  Skin Assessment: Skin Integrity Issues: (critical limb ischemia BLE)  Last BM:  6/18  Height:   Ht Readings from Last 1 Encounters:  04/25/2020 5\' 2"  (1.575 m)    Weight:   Wt Readings from Last 1 Encounters:  04/17/2020 93.1 kg    Ideal Body Weight:  50 kg  BMI:  Body mass index is 37.54 kg/m.  Estimated Nutritional Needs:   Kcal:  1575  Protein:  110-125 gm  Fluid:  1.6-1.8 L    Lucas Mallow, RD, LDN, CNSC Please refer to Amion for contact information.

## 2020-05-01 NOTE — Consult Note (Signed)
NAME:  [name], MRN:  [id number], DOB:  1956/07/05, LOS: 3 ADMISSION DATE:  04/12/2020, CONSULTATION DATE:  05/03/2020 REFERRING MD:  Anesthesia, CHIEF COMPLAINT:  Respiratory failure   Brief History   64 y.o. F who fell on 6/18 and developed Rhabdo and worsening RLE pain, taken to the OR on 6/20 for thromboembolectomy and fasciotomy, was extubated and then had to be re-intubated and found to be profoundly anemic.  PCCM consulted for transfer to ICU.   History of present illness   64 y.o. F with PMH HTN, CKD and RLS who was initially admitted 6/18 after rolling out bed and being down on the floor for several hours. She was treated for rhabdo with IVF and admitted, later developed mottling of the RLE and taken to the OR 6/20 for thromboembolectomy and compartment fasciotomies. Post-op was extubated and awake, then developed worsening respiratory status and was re-intubated. Labs returned with Hgb 4.2, K 7.3, pH 7.0 and pt requiring Neo gtt.    Per report, EBL 1L intraoperatively.  Patient was given 2 A bicarb, calcium, insulin, 4 units PRBC ordered and transferred to the intensive care unit for the initiation of CRRT.  Past Medical History   has a past medical history of Balance disorder, Hypertension, and Renal disorder.  Significant Hospital Events   6/18 Admit to internal medicine 6/20 to OR for thromboembolectomy and fasciotomy  Consults:  Vascular surgery PCCM nephrology  Procedures:  ETT 6/20   Significant Diagnostic Tests:  6/18 CT head>> no acute findings 6/18 MRI brain>>unremarkable  Micro Data:  6/18 blood cultures x2>> 6/18 SARS-Covid-2>> negative  Antimicrobials:  Ancef 6/20-  Interim history/subjective:  Patient transferred to the intensive care unit with improving blood pressure  Objective   Blood pressure (!) 95/41, pulse 87, temperature 97.7 F (36.5 C), temperature source Axillary, resp. rate (!) 28, height 5\' 2"  (1.575 m), weight 88.5 kg, SpO2 100  %.    Vent Mode: PRVC FiO2 (%):  [50 %-100 %] 50 % Set Rate:  [20 bmp] 20 bmp Vt Set:  [400 mL] 400 mL PEEP:  [5 cmH20] 5 cmH20 Plateau Pressure:  [13 cmH20-15 cmH20] 15 cmH20   Intake/Output Summary (Last 24 hours) at 05/07/2020 0020 Last data filed at 04/15/2020 2330 Gross per 24 hour  Intake 3575.18 ml  Output 1000 ml  Net 2575.18 ml   Filed Weights   04/29/20 0037 04/29/20 0458 04/29/2020 0100  Weight: 83.6 kg 84.6 kg 88.5 kg   General: Overweight female intubated and sedated HEENT: MM pink/moist, ET tube in place  Neuro: Unresponsive after propofol and etomidate for intubation CV: s1s2 RRR, no m/r/g PULM: Decreased breath sounds bilateral bases  GI: soft, bsx4 active  Extremities: warm/dry, RLE tightly bandaged postop Skin: no rashes or lesions.,  Bullae noted on LLE   Resolved Hospital Problem list     Assessment & Plan:   Hemorrhagic shock post RLE thrombectomy and fasciotomy and reintubation Acutely worsened after extubation reintubated, transferred to intensive care -Transfuse 4 units PRBCs, 1 unit FFP, 1 unit platelets -Follow serial hemoglobin -Continue Neo-Synephrine for now, if blood pressure does not improve after transfusion transition to Levophed -Lactic acid --Maintain full vent support with SAT/SBT as tolerated -titrate Vent setting to maintain SpO2 greater than or equal to 90%. -HOB elevated 30 degrees. -Plateau pressures less than 30 cm H20.  -Follow chest x-ray, ABG prn.   -Bronchial hygiene and RT/bronchodilator protocol.    Acute on chronic renal failure with metabolic acidosis  and hyperkalemia Received calcium, 10 units insulin, 2 A of bicarb -Initiate CRRT per nephrology recommendations -Currently oliguric, monitor for signs of renal recovery -Closely follow electrolytes       Best practice:  Diet: N.p.o. Pain/Anxiety/Delirium protocol (if indicated): Fentanyl/propofol VAP protocol (if indicated): HOB 30 degrees, suction as  needed DVT prophylaxis: Heparin GI prophylaxis: Protonix Glucose control: SSI Mobility: Bedrest Code Status: Full code Family Communication: Medicine team attempted to reach patient's emergency contact  Disposition: ICU  Labs   CBC: Recent Labs  Lab 05/07/2020 1139 04/11/2020 1139 04/22/2020 1816 04/29/20 0215 04/12/2020 1047 04/29/2020 2150 04/12/2020 0000  WBC 28.1*  --   --  27.0* 20.9* 24.0*  --   NEUTROABS 26.1*  --   --   --   --   --   --   HGB 15.1*   < > 17.3* 13.8 11.0* 4.2* 5.4*  HCT 48.6*   < > 51.0* 43.1 34.6* 14.4* 16.0*  MCV 88.0  --   --  86.9 89.2 94.7  --   PLT 352  --   --  275 236 189  --    < > = values in this interval not displayed.    Basic Metabolic Panel: Recent Labs  Lab 04/29/20 1744 04/29/20 1744 05/05/2020 0233 05/08/2020 1214 05/07/2020 1754 05/10/2020 2150 04/23/2020 0000  NA 135   < > 132* 134* 132* 134* 137  K 5.7*   < > 5.2* 6.3* 6.5* 7.3* 5.6*  CL 107  --  103 105 104 107  --   CO2 19*  --  16* 17* 13* 11*  --   GLUCOSE 116*  --  129* 111* 145* 184*  --   BUN 68*  --  79* 88* 94* 88*  --   CREATININE 4.37*  --  5.27* 6.09* 6.73* 6.63*  --   CALCIUM 6.6*  --  6.5* 6.5* 6.6* 5.7*  --   PHOS  --   --  9.7*  --   --   --   --    < > = values in this interval not displayed.   GFR: Estimated Creatinine Clearance: 8.7 mL/min (A) (by C-G formula based on SCr of 6.63 mg/dL (H)). Recent Labs  Lab 04/20/2020 1139 05/03/2020 1636 04/22/2020 1932 04/29/20 0215 04/20/2020 1047 05/03/2020 2150  WBC 28.1*  --   --  27.0* 20.9* 24.0*  LATICACIDVEN  --  2.1* 3.1*  --   --   --     Liver Function Tests: Recent Labs  Lab 05/02/2020 1139 04/29/20 0215  AST 371* 2,253*  ALT 155* 977*  ALKPHOS 74 57  BILITOT 1.7* 1.4*  PROT 8.0 5.4*  ALBUMIN 4.2 2.4*   Recent Labs  Lab 05/09/2020 1139  LIPASE 29   Recent Labs  Lab 04/27/2020 1636  AMMONIA 22    ABG    Component Value Date/Time   PHART 7.231 (L) 04/11/2020 0000   PCO2ART 30.8 (L) 04/25/2020 0000    PO2ART 282 (H) 04/21/2020 0000   HCO3 13.1 (L) 04/17/2020 0000   TCO2 14 (L) 04/21/2020 0000   ACIDBASEDEF 13.0 (H) 04/14/2020 0000   O2SAT 100.0 05/07/2020 0000     Coagulation Profile: Recent Labs  Lab 04/22/2020 1754  INR 1.1    Cardiac Enzymes: Recent Labs  Lab 05/10/2020 1139 04/13/2020 1933 04/29/20 0215 04/29/20 1744 04/27/2020 0233  CKTOTAL 49,279* >50,000* >50,000* >50,000* >50,000*    HbA1C: Hgb A1c MFr Bld  Date/Time Value Ref Range  Status  04/29/2020 02:15 AM 6.3 (H) 4.8 - 5.6 % Final    Comment:    (NOTE) Pre diabetes:          5.7%-6.4%  Diabetes:              >6.4%  Glycemic control for   <7.0% adults with diabetes     CBG: Recent Labs  Lab 05/04/2020 0555 04/27/2020 1216 04/22/2020 1742 05/05/2020 2122 04/11/2020 0016  GLUCAP 120* 98 159* 203* 87    Review of Systems:   Unable to obtain secondary to mental status  Past Medical History  She,  has a past medical history of Balance disorder, Hypertension, and Renal disorder.   Surgical History    Past Surgical History:  Procedure Laterality Date  . CESAREAN SECTION       Social History   reports that she has never smoked. She has never used smokeless tobacco. She reports previous alcohol use. She reports that she does not use drugs.   Family History   Her family history is not on file.   Allergies Allergies  Allergen Reactions  . Olmesartan Other (See Comments)    GFR drop  . Requip [Ropinirole] Other (See Comments)    Overactive bladder  . Valsartan Other (See Comments)    GFR drop     Home Medications  Prior to Admission medications   Medication Sig Start Date End Date Taking? Authorizing Provider  amLODipine (NORVASC) 10 MG tablet Take 10 mg by mouth daily. 04/01/20  Yes [provider]  gabapentin (NEURONTIN) 100 MG capsule Take 100-200 mg by mouth See admin instructions. Take 200 mg by mouth midday and 100 mg two hours before bedtime 04/24/20  Yes [provider]   rOPINIRole (REQUIP) 2 MG tablet Take by mouth. Patient not taking: Reported on 04/18/2020 03/29/20   [provider]     Critical care time: 65 minutes   CRITICAL CARE Performed by: Otilio Carpen Clydell Alberts   Total critical care time: 65 minutes  Critical care time was exclusive of separately billable procedures and treating other patients.  Critical care was necessary to treat or prevent imminent or life-threatening deterioration.  Critical care was time spent personally by me on the following activities: development of treatment plan with patient and/or surrogate as well as nursing, discussions with consultants, evaluation of patient's response to treatment, examination of patient, obtaining history from patient or surrogate, ordering and performing treatments and interventions, ordering and review of laboratory studies, ordering and review of radiographic studies, pulse oximetry and re-evaluation of patient's condition.   Otilio Carpen Kaylean Tupou, PA-C

## 2020-05-02 ENCOUNTER — Encounter (HOSPITAL_COMMUNITY): Payer: Self-pay | Admitting: Vascular Surgery

## 2020-05-02 ENCOUNTER — Inpatient Hospital Stay (HOSPITAL_COMMUNITY): Payer: Medicare Other

## 2020-05-02 DIAGNOSIS — R238 Other skin changes: Secondary | ICD-10-CM

## 2020-05-02 DIAGNOSIS — E872 Acidosis: Secondary | ICD-10-CM

## 2020-05-02 DIAGNOSIS — Z9911 Dependence on respirator [ventilator] status: Secondary | ICD-10-CM

## 2020-05-02 DIAGNOSIS — N183 Chronic kidney disease, stage 3 unspecified: Secondary | ICD-10-CM

## 2020-05-02 DIAGNOSIS — R578 Other shock: Secondary | ICD-10-CM

## 2020-05-02 DIAGNOSIS — A419 Sepsis, unspecified organism: Secondary | ICD-10-CM

## 2020-05-02 DIAGNOSIS — N179 Acute kidney failure, unspecified: Secondary | ICD-10-CM

## 2020-05-02 DIAGNOSIS — R6521 Severe sepsis with septic shock: Secondary | ICD-10-CM

## 2020-05-02 LAB — BASIC METABOLIC PANEL WITH GFR
Anion gap: 15 (ref 5–15)
Anion gap: 14 (ref 5–15)
BUN: 35 mg/dL — ABNORMAL HIGH (ref 8–23)
BUN: 26 mg/dL — ABNORMAL HIGH (ref 8–23)
CO2: 20 mmol/L — ABNORMAL LOW (ref 22–32)
CO2: 21 mmol/L — ABNORMAL LOW (ref 22–32)
Calcium: 6.7 mg/dL — ABNORMAL LOW (ref 8.9–10.3)
Calcium: 7 mg/dL — ABNORMAL LOW (ref 8.9–10.3)
Chloride: 100 mmol/L (ref 98–111)
Chloride: 101 mmol/L (ref 98–111)
Creatinine, Ser: 2.31 mg/dL — ABNORMAL HIGH (ref 0.44–1.00)
Creatinine, Ser: 1.88 mg/dL — ABNORMAL HIGH (ref 0.44–1.00)
GFR calc Af Amer: 25 mL/min — ABNORMAL LOW
GFR calc Af Amer: 32 mL/min — ABNORMAL LOW
GFR calc non Af Amer: 21 mL/min — ABNORMAL LOW
GFR calc non Af Amer: 28 mL/min — ABNORMAL LOW
Glucose, Bld: 309 mg/dL — ABNORMAL HIGH (ref 70–99)
Glucose, Bld: 116 mg/dL — ABNORMAL HIGH (ref 70–99)
Potassium: 3.9 mmol/L (ref 3.5–5.1)
Potassium: 3.9 mmol/L (ref 3.5–5.1)
Sodium: 135 mmol/L (ref 135–145)
Sodium: 136 mmol/L (ref 135–145)

## 2020-05-02 LAB — HEPATIC FUNCTION PANEL
ALT: 3896 U/L — ABNORMAL HIGH (ref 0–44)
AST: 7064 U/L — ABNORMAL HIGH (ref 15–41)
Albumin: 2.5 g/dL — ABNORMAL LOW (ref 3.5–5.0)
Alkaline Phosphatase: 110 U/L (ref 38–126)
Bilirubin, Direct: 0.9 mg/dL — ABNORMAL HIGH (ref 0.0–0.2)
Indirect Bilirubin: 1.9 mg/dL — ABNORMAL HIGH (ref 0.3–0.9)
Total Bilirubin: 2.8 mg/dL — ABNORMAL HIGH (ref 0.3–1.2)
Total Protein: 3.8 g/dL — ABNORMAL LOW (ref 6.5–8.1)

## 2020-05-02 LAB — CBC
HCT: 37.5 % (ref 36.0–46.0)
HCT: 37.2 % (ref 36.0–46.0)
Hemoglobin: 12.9 g/dL (ref 12.0–15.0)
Hemoglobin: 13.1 g/dL (ref 12.0–15.0)
MCH: 29.2 pg (ref 26.0–34.0)
MCH: 30 pg (ref 26.0–34.0)
MCHC: 34.4 g/dL (ref 30.0–36.0)
MCHC: 35.2 g/dL (ref 30.0–36.0)
MCV: 84.8 fL (ref 80.0–100.0)
MCV: 85.1 fL (ref 80.0–100.0)
Platelets: 127 10*3/uL — ABNORMAL LOW (ref 150–400)
Platelets: 126 10*3/uL — ABNORMAL LOW (ref 150–400)
RBC: 4.42 MIL/uL (ref 3.87–5.11)
RBC: 4.37 MIL/uL (ref 3.87–5.11)
RDW: 15.5 % (ref 11.5–15.5)
RDW: 15.7 % — ABNORMAL HIGH (ref 11.5–15.5)
WBC: 19.1 10*3/uL — ABNORMAL HIGH (ref 4.0–10.5)
WBC: 27.1 10*3/uL — ABNORMAL HIGH (ref 4.0–10.5)
nRBC: 0.1 % (ref 0.0–0.2)
nRBC: 0.3 % — ABNORMAL HIGH (ref 0.0–0.2)

## 2020-05-02 LAB — CK: Total CK: >50000 U/L — ABNORMAL HIGH (ref 38–234)

## 2020-05-02 LAB — TYPE AND SCREEN
ABO/RH(D): A POS
Antibody Screen: NEGATIVE
Unit division: 0
Unit division: 0
Unit division: 0
Unit division: 0
Unit division: 0
Unit division: 0
Unit division: 0
Unit division: 0

## 2020-05-02 LAB — GLUCOSE, CAPILLARY
Glucose-Capillary: 26 mg/dL — CL (ref 70–99)
Glucose-Capillary: 272 mg/dL — ABNORMAL HIGH (ref 70–99)
Glucose-Capillary: 210 mg/dL — ABNORMAL HIGH (ref 70–99)
Glucose-Capillary: 101 mg/dL — ABNORMAL HIGH (ref 70–99)
Glucose-Capillary: 100 mg/dL — ABNORMAL HIGH (ref 70–99)
Glucose-Capillary: 112 mg/dL — ABNORMAL HIGH (ref 70–99)
Glucose-Capillary: 124 mg/dL — ABNORMAL HIGH (ref 70–99)

## 2020-05-02 LAB — BPAM RBC
Blood Product Expiration Date: 202107092359
Blood Product Expiration Date: 202107092359
Blood Product Expiration Date: 202107092359
Blood Product Expiration Date: 202107092359
Blood Product Expiration Date: 202107142359
Blood Product Expiration Date: 202107142359
Blood Product Expiration Date: 202107272359
Blood Product Expiration Date: 202107272359
ISSUE DATE / TIME: 202106202231
ISSUE DATE / TIME: 202106202231
ISSUE DATE / TIME: 202106202320
ISSUE DATE / TIME: 202106202320
ISSUE DATE / TIME: 202106202320
ISSUE DATE / TIME: 202106211500
ISSUE DATE / TIME: 202106211813
ISSUE DATE / TIME: 202106212050
Unit Type and Rh: 5100
Unit Type and Rh: 5100
Unit Type and Rh: 6200
Unit Type and Rh: 6200
Unit Type and Rh: 6200
Unit Type and Rh: 6200
Unit Type and Rh: 6200
Unit Type and Rh: 6200

## 2020-05-02 LAB — LACTIC ACID, PLASMA
Lactic Acid, Venous: 4.6 mmol/L (ref 0.5–1.9)
Lactic Acid, Venous: 5.8 mmol/L (ref 0.5–1.9)
Lactic Acid, Venous: 3.8 mmol/L (ref 0.5–1.9)
Lactic Acid, Venous: 3.2 mmol/L (ref 0.5–1.9)

## 2020-05-02 LAB — MAGNESIUM: Magnesium: 2.3 mg/dL (ref 1.7–2.4)

## 2020-05-02 LAB — RENAL FUNCTION PANEL
Albumin: 2.4 g/dL — ABNORMAL LOW (ref 3.5–5.0)
Albumin: 2.6 g/dL — ABNORMAL LOW (ref 3.5–5.0)
Anion gap: 13 (ref 5–15)
Anion gap: 12 (ref 5–15)
BUN: 31 mg/dL — ABNORMAL HIGH (ref 8–23)
BUN: 24 mg/dL — ABNORMAL HIGH (ref 8–23)
CO2: 22 mmol/L (ref 22–32)
CO2: 21 mmol/L — ABNORMAL LOW (ref 22–32)
Calcium: 6.7 mg/dL — ABNORMAL LOW (ref 8.9–10.3)
Calcium: 7.3 mg/dL — ABNORMAL LOW (ref 8.9–10.3)
Chloride: 100 mmol/L (ref 98–111)
Chloride: 104 mmol/L (ref 98–111)
Creatinine, Ser: 2.21 mg/dL — ABNORMAL HIGH (ref 0.44–1.00)
Creatinine, Ser: 1.69 mg/dL — ABNORMAL HIGH (ref 0.44–1.00)
GFR calc Af Amer: 26 mL/min — ABNORMAL LOW (ref >60)
GFR calc Af Amer: 36 mL/min — ABNORMAL LOW
GFR calc non Af Amer: 23 mL/min — ABNORMAL LOW (ref >60)
GFR calc non Af Amer: 31 mL/min — ABNORMAL LOW
Glucose, Bld: 251 mg/dL — ABNORMAL HIGH (ref 70–99)
Glucose, Bld: 111 mg/dL — ABNORMAL HIGH (ref 70–99)
Phosphorus: 5.4 mg/dL — ABNORMAL HIGH (ref 2.5–4.6)
Phosphorus: 4 mg/dL (ref 2.5–4.6)
Potassium: 3.7 mmol/L (ref 3.5–5.1)
Potassium: 4.1 mmol/L (ref 3.5–5.1)
Sodium: 135 mmol/L (ref 135–145)
Sodium: 137 mmol/L (ref 135–145)

## 2020-05-02 LAB — BPAM PLATELET PHERESIS
Blood Product Expiration Date: 202106212359
ISSUE DATE / TIME: 202106210115
Unit Type and Rh: 7300

## 2020-05-02 LAB — PREPARE PLATELET PHERESIS: Unit division: 0

## 2020-05-02 LAB — ECHOCARDIOGRAM COMPLETE
Height: 62 in
Weight: 3019.42 oz

## 2020-05-02 LAB — PROTIME-INR
INR: 2.3 — ABNORMAL HIGH (ref 0.8–1.2)
INR: 2 — ABNORMAL HIGH (ref 0.8–1.2)
Prothrombin Time: 24.8 s — ABNORMAL HIGH (ref 11.4–15.2)
Prothrombin Time: 21.7 s — ABNORMAL HIGH (ref 11.4–15.2)

## 2020-05-02 LAB — PREPARE FRESH FROZEN PLASMA

## 2020-05-02 LAB — BPAM FFP
Blood Product Expiration Date: 202106252359
ISSUE DATE / TIME: 202106210153
Unit Type and Rh: 6200

## 2020-05-02 LAB — APTT: aPTT: 34 seconds (ref 24–36)

## 2020-05-02 LAB — TRIGLYCERIDES: Triglycerides: 81 mg/dL

## 2020-05-02 MED ORDER — SENNOSIDES-DOCUSATE SODIUM 8.6-50 MG PO TABS: 1.0000 | ORAL_TABLET | Freq: Every day | ORAL | Status: DC

## 2020-05-02 MED ORDER — INSULIN ASPART 100 UNIT/ML ~~LOC~~ SOLN
2.0000 [IU] | SUBCUTANEOUS | Status: DC
  Administered 2020-05-02 – 2020-05-03 (×4): 2 [IU] via SUBCUTANEOUS

## 2020-05-02 MED ORDER — PRISMASOL BGK 4/2.5 32-4-2.5 MEQ/L REPLACEMENT SOLN
Status: DC
  Filled 2020-05-02 (×7): qty 5000

## 2020-05-02 MED ORDER — DOCUSATE SODIUM 50 MG/5ML PO LIQD
100.0000 mg | Freq: Two times a day (BID) | ORAL | Status: DC
  Administered 2020-05-02 – 2020-05-03 (×3): 100 mg via ORAL
  Filled 2020-05-02 (×3): qty 10

## 2020-05-02 MED ORDER — ARTIFICIAL TEARS OPHTHALMIC OINT
TOPICAL_OINTMENT | Freq: Three times a day (TID) | OPHTHALMIC | Status: DC
  Administered 2020-05-03 – 2020-05-06 (×2): 1 via OPHTHALMIC
  Filled 2020-05-02: qty 3.5

## 2020-05-02 MED ORDER — INSULIN GLARGINE 100 UNIT/ML ~~LOC~~ SOLN
20.0000 [IU] | Freq: Every day | SUBCUTANEOUS | Status: DC
  Administered 2020-05-02: 20 [IU] via SUBCUTANEOUS
  Filled 2020-05-02 (×3): qty 0.2

## 2020-05-02 MED ORDER — FENTANYL 2500MCG IN NS 250ML (10MCG/ML) PREMIX INFUSION
50.0000 ug/h | INTRAVENOUS | Status: DC
  Administered 2020-05-02: 50 ug/h via INTRAVENOUS
  Filled 2020-05-02: qty 250

## 2020-05-02 MED ORDER — FENTANYL CITRATE (PF) 100 MCG/2ML IJ SOLN: 50.0000 ug | Freq: Once | INTRAMUSCULAR | Status: DC

## 2020-05-02 MED ORDER — FENTANYL BOLUS VIA INFUSION
50.0000 ug | INTRAVENOUS | Status: DC | PRN
  Filled 2020-05-02: qty 50

## 2020-05-02 MED ORDER — POLYETHYLENE GLYCOL 3350 17 G PO PACK
17.0000 g | PACK | Freq: Every day | ORAL | Status: DC
  Administered 2020-05-03: 17 g via ORAL
  Filled 2020-05-02: qty 1

## 2020-05-02 MED ORDER — PRISMASOL BGK 4/2.5 32-4-2.5 MEQ/L REPLACEMENT SOLN
Status: DC
  Filled 2020-05-02 (×5): qty 5000

## 2020-05-02 MED ORDER — PRISMASOL BGK 4/2.5 32-4-2.5 MEQ/L IV SOLN
INTRAVENOUS | Status: DC
  Filled 2020-05-02 (×35): qty 5000

## 2020-05-02 NOTE — Progress Notes (Signed)
PT Cancellation Note  Patient Details Name: [name] MRN: [id number] DOB: 12-03-1955   Cancelled Treatment:    Reason Eval/Treat Not Completed: Medical issues which prohibited therapy.  Pt is unstable, on dialysis, pressors.  Will see when able and appropriate. 05/02/2020  [name], PT [facility] [phone]  (pager) [phone]  (office)   [name] [name] 05/02/2020, 11:41 AM

## 2020-05-02 NOTE — Progress Notes (Signed)
  Progress Note    05/02/2020 4:32 PM 2 Days Post-Op  Subjective: Remains intubated does respond to questions  Vitals:   05/02/20 1600 05/02/20 1615  BP:    Pulse:    Resp:  (!) 26  Temp:    SpO2: 100% 100%    Physical Exam: Responds to questions Left lower extremity fasciotomies muscle wall appears viable there is a strong dorsalis pedis signals on the left Patient has minimal movement of her foot on command Right AKA dressing clean dry intact  CBC    Component Value Date/Time   WBC 27.1 (H) 05/02/2020 1048   RBC 4.37 05/02/2020 1048   HGB 13.1 05/02/2020 1048   HCT 37.2 05/02/2020 1048   PLT 126 (L) 05/02/2020 1048   MCV 85.1 05/02/2020 1048   MCH 30.0 05/02/2020 1048   MCHC 35.2 05/02/2020 1048   RDW 15.7 (H) 05/02/2020 1048   LYMPHSABS 0.3 (L) 05/09/2020 1139   MONOABS 1.7 (H) 05/10/2020 1139   EOSABS 0.0 04/15/2020 1139   BASOSABS 0.0 04/15/2020 1139    BMET    Component Value Date/Time   NA 137 05/02/2020 1540   K 4.1 05/02/2020 1540   CL 104 05/02/2020 1540   CO2 21 (L) 05/02/2020 1540   GLUCOSE 111 (H) 05/02/2020 1540   BUN 24 (H) 05/02/2020 1540   CREATININE 1.69 (H) 05/02/2020 1540   CALCIUM 7.3 (L) 05/02/2020 1540   GFRNONAA 31 (L) 05/02/2020 1540   GFRAA 36 (L) 05/02/2020 1540    INR    Component Value Date/Time   INR 2.0 (H) 05/02/2020 1048     Intake/Output Summary (Last 24 hours) at 05/02/2020 1632 Last data filed at 05/02/2020 1600 Gross per 24 hour  Intake 4914.66 ml  Output 5733 ml  Net -818.34 ml     Assessment/plan:  64 y.o. female is s/p right lower extremity 4 compartment fasciotomy and thromboembolectomy where no clot was returned.  She ultimately required right above-knee amputation for worsening CKs and hypertension.  She was later noted to have left lower extremity compartment syndrome where previously she had palpable pulses and soft compartments.  She now has 4 department fasciotomies on the left.  The etiology of all  of this is unclear.  Muscle in the left does appear viable.  After we get some of the swelling down the leg we will consider closing the fasciotomies at the bedside versus in the operating room.    Chereese Cilento C. Donzetta Matters, MD [facility] Office: [phone] Pager: [phone]  05/02/2020 4:32 PM

## 2020-05-02 NOTE — Progress Notes (Signed)
Patient ID: [name], female   DOB: 04-19-1956, 64 y.o.   MRN: [id number]         [facility]  Date of Admission:  04/23/2020   Total days of antibiotics 3        Day 2 vancomycin        Day 2 cefepime        Day 2 clindamycin ASSESSMENT: No clear source of infection has been found but she has sepsis physiology.  We will continue empiric vancomycin and cefepime.  Stop clindamycin now.  PLAN: 1. Continue vancomycin and cefepime 2. Discontinue clindamycin  Principal Problem:   Blisters of multiple sites Active Problems:   Compartment syndrome of right lower extremity (HCC)   Rhabdomyolysis   Respiratory failure (HCC)   Acute renal failure superimposed on stage 3 chronic kidney disease (HCC)   AMS (altered mental status)   Hemorrhagic shock (HCC)   Scheduled Meds: . sodium chloride   Intravenous Once  . artificial tears   Right Eye Q8H  . chlorhexidine gluconate (MEDLINE KIT)  15 mL Mouth Rinse BID  . Chlorhexidine Gluconate Cloth  6 each Topical Daily  . feeding supplement (PRO-STAT SUGAR FREE 64)  30 mL Per Tube BID  . heparin  5,000 Units Subcutaneous Q8H  . insulin aspart  0-15 Units Subcutaneous Q4H  . insulin aspart  2 Units Subcutaneous Q4H  . insulin glargine  20 Units Subcutaneous Daily  . mouth rinse  15 mL Mouth Rinse 10 times per day  . pantoprazole sodium  40 mg Per Tube Daily  . rocuronium  50 mg Intravenous Once  . sodium chloride flush  10-40 mL Intracatheter Q12H   Continuous Infusions: .  prismasol BGK 4/2.5 400 mL/hr at 05/02/20 0847  .  prismasol BGK 4/2.5 400 mL/hr at 05/02/20 0847  . sodium chloride Stopped (04/29/2020 1147)  . sodium chloride 10 mL/hr at 04/27/2020 1835  . ceFEPime (MAXIPIME) IV Stopped (05/02/20 0953)  . clindamycin (CLEOCIN) IV Stopped (05/02/20 0539)  . feeding supplement (VITAL AF 1.2 CAL) 1,000 mL (05/10/2020 1832)  . phenylephrine (NEO-SYNEPHRINE) Adult infusion 40 mcg/min (05/02/20 1000)  .  prismasol BGK 4/2.5 2,000 mL/hr at 05/02/20 0847  . propofol (DIPRIVAN) infusion 10 mcg/kg/min (05/02/20 0400)  . vancomycin Stopped (05/02/20 0855)  . vasopressin (PITRESSIN) infusion - *FOR SHOCK* 0.03 Units/min (05/02/20 1051)   PRN Meds:.sodium chloride, sodium chloride, acetaminophen **OR** acetaminophen, alteplase, docusate sodium, fentaNYL (SUBLIMAZE) injection, fentaNYL (SUBLIMAZE) injection, heparin, heparin, lip balm, metoprolol tartrate, ondansetron, polyethylene glycol, sodium chloride, sodium chloride flush   Review of Systems: Review of Systems  Unable to perform ROS: Intubated    Allergies  Allergen Reactions  . Olmesartan Other (See Comments)    GFR drop  . Requip [Ropinirole] Other (See Comments)    Overactive bladder  . Valsartan Other (See Comments)    GFR drop    OBJECTIVE: Vitals:   05/02/20 0915 05/02/20 0930 05/02/20 0945 05/02/20 1000  BP:      Pulse:    74  Resp: (!) 26 (!) 25 18 (!) 27  Temp:      TempSrc:      SpO2:    100%  Weight:      Height:       Body mass index is 34.52 kg/m.  Physical Exam Constitutional:      Comments: She remains sedated and on the ventilator.  She is still on pressors.  He underwent right AKA yesterday  and the left calf fasciotomies.  Cardiovascular:     Rate and Rhythm: Regular rhythm. Tachycardia present.     Heart sounds: No murmur heard.   Pulmonary:     Breath sounds: Normal breath sounds.  Musculoskeletal:     Comments: She has Ace wraps on her left and right legs.  No new blisters have been noted.     Lab Results Lab Results  Component Value Date   WBC 19.1 (H) 05/02/2020   HGB 12.9 05/02/2020   HCT 37.5 05/02/2020   MCV 84.8 05/02/2020   PLT 127 (L) 05/02/2020    Lab Results  Component Value Date   CREATININE 2.21 (H) 05/02/2020   BUN 31 (H) 05/02/2020   NA 135 05/02/2020   K 3.7 05/02/2020   CL 100 05/02/2020   CO2 22 05/02/2020    Lab Results  Component Value Date   ALT 3,896 (H)  05/02/2020   AST 7,064 (H) 05/02/2020   ALKPHOS 110 05/02/2020   BILITOT 2.8 (H) 05/02/2020     Microbiology: Recent Results (from the past 240 hour(s))  Blood culture (routine x 2)     Status: None (Preliminary result)   Collection Time: 04/17/2020  1:16 PM   Specimen: BLOOD  Result Value Ref Range Status   Specimen Description BLOOD SITE NOT SPECIFIED  Final   Special Requests   Final    BOTTLES DRAWN AEROBIC AND ANAEROBIC Blood Culture results may not be optimal due to an inadequate volume of blood received in culture bottles   Culture   Final    NO GROWTH 4 DAYS Performed at [facility], [place] [address], [place], [place] [address]    Report Status PENDING  Incomplete  Blood culture (routine x 2)     Status: None (Preliminary result)   Collection Time: 05/05/2020  7:32 PM   Specimen: BLOOD  Result Value Ref Range Status   Specimen Description BLOOD SITE NOT SPECIFIED  Final   Special Requests   Final    BOTTLES DRAWN AEROBIC AND ANAEROBIC Blood Culture results may not be optimal due to an inadequate volume of blood received in culture bottles   Culture   Final    NO GROWTH 4 DAYS Performed at [facility], [place] [address], [place], [place] [address]    Report Status PENDING  Incomplete  SARS Coronavirus 2 by RT PCR (hospital order, performed in [facility] hospital lab) Nasopharyngeal Nasopharyngeal Swab     Status: None   Collection Time: 04/20/2020  8:19 PM   Specimen: Nasopharyngeal Swab  Result Value Ref Range Status   SARS Coronavirus 2 NEGATIVE NEGATIVE Final    Comment: (NOTE) SARS-CoV-2 target nucleic acids are NOT DETECTED.  The SARS-CoV-2 RNA is generally detectable in upper and lower respiratory specimens during the acute phase of infection. The lowest concentration of SARS-CoV-2 viral copies this assay can detect is 250 copies / mL. A negative result does not preclude SARS-CoV-2 infection and should not be used as the sole basis for treatment or  other patient management decisions.  A negative result may occur with improper specimen collection / handling, submission of specimen other than nasopharyngeal swab, presence of viral mutation(s) within the areas targeted by this assay, and inadequate number of viral copies (<250 copies / mL). A negative result must be combined with clinical observations, patient history, and epidemiological information.  Fact Sheet for Patients:   StrictlyIdeas.no  Fact Sheet for Healthcare Providers: BankingDealers.co.za  This  test is not yet approved or  cleared by the [place] and has been authorized for detection and/or diagnosis of SARS-CoV-2 by FDA under an Emergency Use Authorization (EUA).  This EUA will remain in effect (meaning this test can be used) for the duration of the COVID-19 declaration under Section 564(b)(1) of the Act, 21 U.S.C. section 360bbb-3(b)(1), unless the authorization is terminated or revoked sooner.  Performed at [facility], [place] [address], [place], [place] [address]   MRSA PCR Screening     Status: None   Collection Time: 05/03/2020  6:50 AM   Specimen: Nasopharyngeal  Result Value Ref Range Status   MRSA by PCR NEGATIVE NEGATIVE Final    Comment:        The GeneXpert MRSA Assay (FDA approved for NASAL specimens only), is one component of a comprehensive MRSA colonization surveillance program. It is not intended to diagnose MRSA infection nor to guide or monitor treatment for MRSA infections. Performed at [facility], [place] [address] [place], [place] [address]     Michel Bickers, [facility] [phone] pager   [phone] cell 05/02/2020, 10:58 AM

## 2020-05-02 NOTE — Progress Notes (Signed)
OT Cancellation Note  Patient Details Name: CATALIA MASSETT MRN: [id number] DOB: Jul 14, 1956   Cancelled Treatment:    Reason Eval/Treat Not Completed: Medical issues which prohibited therapy;Patient not medically ready; currently on pressors, CRRT, RN request to hold at this time given medical instability. Will follow.   Lou Cal, OT Acute Rehabilitation Services Pager [phone] Office [phone]   Raymondo Band 05/02/2020, 11:41 AM

## 2020-05-02 NOTE — Progress Notes (Signed)
NAME:  SRITHA CHAUNCEY, MRN:  [id number], DOB:  1956-08-17, LOS: 4 ADMISSION DATE:  05/09/2020, CONSULTATION DATE:  05/02/20 REFERRING MD:  Anesthesia, CHIEF COMPLAINT:  Respiratory failure   Brief History   64 y.o. F who fell on 6/18 and developed Rhabdo and worsening RLE pain, taken to the OR on 6/20 for thromboembolectomy and fasciotomy, was extubated and then had to be re-intubated and found to be profoundly anemic.  PCCM consulted for transfer to ICU.   History of present illness   64 y.o. F with PMH HTN, CKD and RLS who was initially admitted 6/18 after rolling out bed and being down on the floor for several hours. She was treated for rhabdo with IVF and admitted, later developed mottling of the RLE and taken to the OR 6/20 for thromboembolectomy and compartment fasciotomies. Post-op was extubated and awake, then developed worsening respiratory status and was re-intubated. Labs returned with Hgb 4.2, K 7.3, pH 7.0 and pt requiring Neo gtt.    Per report, EBL 1L intraoperatively.  Patient was given 2 A bicarb, calcium, insulin, 4 units PRBC ordered and transferred to the intensive care unit for the initiation of CRRT.  Past Medical History   has a past medical history of Balance disorder, Hypertension, and Renal disorder.  Significant Hospital Events   6/18 Admit to internal medicine 6/20 > RLE thromboembolectomy and fasciotomy, transfer to ICU 6/21>RLE AKA, LLE fasciotomy x 4   Consults:  Vascular surgery PCCM nephrology  Procedures:  ETT 6/20 6/20 > RLE thromboembolectomy and fasciotomy, transfer to ICU 6/21>RLE AKA, LLE fasciotomy x 4   Significant Diagnostic Tests:  6/18 CT head>> no acute findings 6/18 MRI brain>>unremarkable  Micro Data:  6/18 blood cultures x2>> 6/18 SARS-Covid-2>> negative  Antimicrobials:  Ancef 6/20 Cefepime 6/21> vanc 6/21> clinda 6/21>  Interim history/subjective:  Overnight had a period of hypotension after pressors were able to be  discontinued, prompting neo & vaso to be restarted. Propofol also started overnight. This morning she denies complaints.  Objective   Blood pressure (!) 104/56, pulse 77, temperature (!) 97.4 F (36.3 C), temperature source Axillary, resp. rate (!) 25, height 5\' 2"  (1.575 m), weight 85.6 kg, SpO2 100 %.    Vent Mode: PRVC FiO2 (%):  [30 %-35 %] 30 % Set Rate:  [26 bmp] 26 bmp Vt Set:  [400 mL] 400 mL PEEP:  [5 cmH20] 5 cmH20 Plateau Pressure:  [15 cmH20-17 cmH20] 15 cmH20   Intake/Output Summary (Last 24 hours) at 05/02/2020 2202 Last data filed at 05/02/2020 0800 Gross per 24 hour  Intake 6143.57 ml  Output 5686 ml  Net 457.57 ml   Filed Weights   04/15/2020 0000 05/10/2020 0500 05/02/20 0500  Weight: 90.5 kg 93.1 kg 85.6 kg   General: critically ill appearing woman laying in bed in NAD HEENT: [place]/AT, eyes anicteric. R eye crusted. Neuro: tracks with left eye, nods to answer questions and attempts to talk around ETT. Moving BUE weakly. Able to move left toes. CV: S1S2, no murmurs, RRR PULM: tachypneic breathing above the vent, CTAB GI: soft, NT, minimally distended Extremities: AKA on R, LLL bandaged to thigh. No cyanosis on left foot, but delayed cap refill & edematous. Weak but dopplerable pulse. No pain on passive extension of toes. Skin: skin blisters no longer visible; denuded skin on upper thighs.   Resolved Hospital Problem list   Compartment syndrome BLE Acute limb ischemia BLE  Assessment & Plan:   Acute blood loss anemia, hemorrhagic  shock post RLE thrombectomy and fasciotomy Received 6 units pRBCs this admission -follow-up CBC this morning & daily -transfuse for hemodynamically significant bleeding, Hb <7  Shock- hemorrhagic likely with a component of septic shock. 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 after unsuccessful fasciotomy on R, fasciotomy on L -appreciate vascular surgery's assistance -need to prioritize adequate peripheral perfusion to optimize healing/ recovery of LLE -con't frequent neurovascular checks  Acute on chronic renal failure  severe metabolic acidosis - resolving Hyperphosphatemia hyperkalemia- resolving Lactic acidosis- resolving Rhabdo -CRRT per nephrology recommendations; appreciate their assistance -foley to monitor I/Os -con't to monitor electrolytes -d/c bicarb gtt -likely can d/c lokelma today  Acute hypoxic respiratory failure due to severe acidosis, encephalopathy requiring MV. Reintubated after respiratory decompemsation in PACU. -con't LTVV; goal Pplat <30 and driving pressure <17 -titrate PEEP & FiO2 per ARDS protocol -will need cuff leak trial prior to extubation (multiple intubations) -improving but no optimized for SBT given tachypnea & resolving acidosis; SAT & SBT when appropriate -VAP prevention protocol -fentanyl & propofol for sedation, PAD protocol  Acute metabolic encephalopathy due to critical illness, AKI -fentanyl PRN for pain -frequent reorientation, delirium precautions  Skin bullae, likely due to compartment syndrome -con't antibiotics; likely can deescalate soon if cultures remain negative -appreciate ID input -con't to follow cultures  Hyperglycemia, likely 2/2 critical illness -SSI PRN Q4h -adding TF coverage -adding lantus 20 units daily -goal BG 140-180 while in ICU    Coagulopathy and thrombocytopenia, likely dilutional -recheck this AM     Best practice:  Diet: TF Pain/Anxiety/Delirium protocol (if indicated): Fentanyl/propofol VAP protocol (if indicated): HOB 30 degrees, suction as needed DVT prophylaxis: Heparin GI prophylaxis: Protonix Glucose control: SSI, basal bolus insulin Mobility: Bedrest Code Status: Full  code Family Communication: daughter Caryl Pina updated via phone Disposition: ICU  Labs   CBC: Recent Labs  Lab 04/23/2020 1139 05/10/2020 1816 05/10/2020 2150 04/11/2020 2150 04/23/2020 0000 05/09/2020 0531 04/13/2020 1138 05/05/2020 1715 05/02/20 0046  WBC 28.1*   < > 24.0*  --   --  28.5* 25.0* 16.0* 19.1*  NEUTROABS 26.1*  --   --   --   --   --   --   --   --   HGB 15.1*   < > 4.2*   < > 5.4* 13.0 11.7* 7.6* 12.9  HCT 48.6*   < > 14.4*   < > 16.0* 39.0 35.6* 22.3* 37.5  MCV 88.0   < > 94.7  --   --  86.1 86.2 85.1 84.8  PLT 352   < > 189  --   --  223 172 97* 127*   < > = values in this interval not displayed.    Basic Metabolic Panel: Recent Labs  Lab 04/19/2020 0233 04/11/2020 1214 04/15/2020 2150 05/05/2020 2150 04/24/2020 0000 04/24/2020 0000 04/24/2020 0325 04/22/2020 0531 04/27/2020 1138 05/02/20 0046 05/02/20 0513  NA 132*   < > 134*   < > 137  --   --  136 135 135 135  K 5.2*   < > 7.3*   < > 5.6*   < > 5.2* 5.2* 4.7 3.9 3.7  CL 103   < > 107  --   --   --   --  106 105 100 100  CO2 16*   < > 11*  --   --   --   --  14* 12* 20* 22  GLUCOSE 129*   < > 184*  --   --   --   --  100* 91 309* 251*  BUN 79*   < > 88*  --   --   --   --  60* 42* 35* 31*  CREATININE 5.27*   < > 6.63*  --   --    < > 4.81* 4.23* 3.13* 2.31* 2.21*  CALCIUM 6.5*   < > 5.7*  --   --   --   --  6.8* 6.7* 6.7* 6.7*  MG  --   --   --   --   --   --   --  2.4  --   --  2.3  PHOS 9.7*  --   --   --   --   --   --  9.6* 8.9*  --  5.4*   < > = values in this interval not displayed.   GFR: Estimated Creatinine Clearance: 25.8 mL/min (A) (by C-G formula based on SCr of 2.21 mg/dL (H)). Recent Labs  Lab 04/16/2020 0531 05/02/2020 1138 04/17/2020 1142 05/10/2020 1715 05/02/20 0046  WBC 28.5* 25.0*  --  16.0* 19.1*  LATICACIDVEN 5.5*  --  9.2* 6.9* 4.6*    Liver Function Tests: Recent Labs  Lab 04/11/2020 1139 04/29/20 0215 04/16/2020 0531 04/19/2020 1138 05/02/20 0513  AST 371* 2,253*  --  2,016* PENDING  ALT 155* 977*   --  1,723* PENDING  ALKPHOS 74 57  --  64 110  BILITOT 1.7* 1.4*  --  2.9* 2.8*  PROT 8.0 5.4*  --  4.0* 3.8*  ALBUMIN 4.2 2.4* 2.6* 2.2* 2.5*  2.4*   Recent Labs  Lab 05/05/2020 1139  LIPASE 29   Recent Labs  Lab 04/22/2020 1636  AMMONIA 22    ABG    Component Value Date/Time   PHART 7.231 (L) 05/05/2020 0000   PCO2ART 30.8 (L) 04/22/2020 0000   PO2ART 282 (H) 05/08/2020 0000   HCO3 13.1 (L) 05/01/2020 0000   TCO2 14 (L) 05/05/2020 0000   ACIDBASEDEF 13.0 (H) 04/24/2020 0000   O2SAT 100.0 05/03/2020 0000     Coagulation Profile: Recent Labs  Lab 05/02/2020 1754 05/02/20 0046  INR 1.1 2.3*    Cardiac Enzymes: Recent Labs  Lab 04/17/2020 1933 04/29/20 0215 04/29/20 1744 05/08/2020 0233 05/08/2020 0531  CKTOTAL >50,000* >50,000* >50,000* >50,000* >50,000*    HbA1C: Hgb A1c MFr Bld  Date/Time Value Ref Range Status  04/29/2020 02:15 AM 6.3 (H) 4.8 - 5.6 % Final    Comment:    (NOTE) Pre diabetes:          5.7%-6.4%  Diabetes:              >6.4%  Glycemic control for   <7.0% adults with diabetes     CBG: Recent Labs  Lab 04/13/2020 1506 05/09/2020 1909 04/12/2020 2313 05/02/20 0306 05/02/20 0710  GLUCAP 198* 263* 306* 272* 210*    This patient is critically ill with multiple organ system failure which requires frequent high complexity decision making, assessment, support, evaluation, and titration of therapies. This was completed through the application of advanced monitoring technologies and extensive interpretation of multiple databases. During this encounter critical care time was devoted to patient care services described in this note for 47 minutes.  Julian Hy,  DO 05/02/20 8:21 AM [facility]

## 2020-05-02 NOTE — Progress Notes (Signed)
  Echocardiogram 2D Echocardiogram has been performed.  Jennette Dubin 05/02/2020, 8:59 AM

## 2020-05-02 NOTE — Progress Notes (Signed)
Inpatient Rehab Admissions:  Inpatient Rehab Consult received.  Note pt on CRRT and not medically ready for evaluation for CIR.  Will continue to follow from a distance and monitor for therapy recommendations.   Signed: Shann Medal, PT, DPT Admissions Coordinator [phone] 05/02/20  8:56 AM

## 2020-05-02 NOTE — Progress Notes (Addendum)
[facility] Progress Note   Assessment/ Plan:   1. AoCKD 3- b/l creat 1.3 in March 2021, eGFR 46. Found down prior to admission and presented w/ AKI , oliguric w/ CPK >50,000 indicating significant rhabdomyolysis. Renal US w/o hydro. On CRRT, started 05/10/2020 in setting of postop K of 7.3, resp distress, intubation.  No heparin.   Stop lokelma, switch to 4K fluids.  Will try to pull a little fluid to aid wound healing- net neg 50 mL/ hr to see if she tolerates for now.   2. 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  Appreciate VVS  3. Acute blood loss anemia: received 4 u pRBCs in OR  4. Hyperkalemia -resolved  5. Shock: hemorrhagic +/- septic--> on pressor per PCCM, ID consulted, vanc/ clinda/ cefepime  6. Acute hypoxic RF: intubated, per PCCM  7. Metabolic acidosis: transiently on bicarb gtt last night, better now.  8. Dispo: in ICU  Subjective:    S/p R AKA and L 4 compartment fasciotomy yesterday.  K better.     Objective:   BP (!) 104/56   Pulse 74   Temp (!) 97.4 F (36.3 C) (Axillary)   Resp (!) 27   Ht _0  (1.575 m)   Wt 85.6 kg   SpO2 100%   BMI 34.52 kg/m   Physical Exam: Gen: intubated, NAD CVS: RRR Resp: mech bilaterally Abd: soft Ext: s/p R AKA, dressed, s/p L fascitomies wrapped ACCESS: R IJ nontunneled HD cath  Labs: BMET Recent Labs  Lab 04/29/2020 0233 04/21/2020 0233 05/02/2020 1214 05/07/2020 1214 04/26/2020 1754 05/09/2020 1754 05/10/2020 1959 05/09/2020 1959 05/02/2020 2150 04/20/2020 0000 05/04/2020 0325 04/22/2020 0531 04/14/2020 1138 05/02/20 0046 05/02/20 0513  NA 132*   < > 134*   < > 132*   < > 134*  --  134* 137  --  136 135 135 135  K 5.2*   < > 6.3*   < > 6.5*   < > 4.7   < > 7.3* 5.6* 5.2* 5.2* 4.7 3.9 3.7  CL 103   < > 105   < >  104  --  102  --  107  --   --  106 105 100 100  CO2 16*   < > 17*  --  13*  --   --   --  11*  --   --  14* 12* 20* 22  GLUCOSE 129*   < > 111*   < > 145*  --  124*  --  184*  --   --  100* 91 309* 251*  BUN 79*   < > 88*   < > 94*  --  101*  --  88*  --   --  60* 42* 35* 31*  CREATININE 5.27*   < > 6.09*   < > 6.73*   < > 7.10*  --  6.63*  --  4.81* 4.23* 3.13* 2.31* 2.21*  CALCIUM 6.5*   < > 6.5*  --  6.6*  --   --   --  5.7*  --   --  6.8* 6.7* 6.7* 6.7*  PHOS 9.7*  --   --   --   --   --   --   --   --   --   --  9.6* 8.9*  --  5.4*   < > = values in this interval not displayed.   CBC Recent Labs  Lab 04/11/2020 1139 04/15/2020 1816 05/02/2020 0531 05/05/2020 1138 04/17/2020 1715 05/02/20 0046  WBC 28.1*   < > 28.5* 25.0* 16.0* 19.1*  NEUTROABS 26.1*  --   --   --   --   --   HGB 15.1*   < > 13.0 11.7* 7.6* 12.9  HCT 48.6*   < > 39.0 35.6* 22.3* 37.5  MCV 88.0   < > 86.1 86.2 85.1 84.8  PLT 352   < > 223 172 97* 127*   < > = values in this interval not displayed.      Medications:    . sodium chloride   Intravenous Once  . artificial tears   Right Eye Q8H  . chlorhexidine gluconate (MEDLINE KIT)  15 mL Mouth Rinse BID  . Chlorhexidine Gluconate Cloth  6 each Topical Daily  . feeding supplement (PRO-STAT SUGAR FREE 64)  30 mL Per Tube BID  . heparin  5,000 Units Subcutaneous Q8H  . insulin aspart  0-15 Units Subcutaneous Q4H  . insulin aspart  2 Units Subcutaneous Q4H  . insulin glargine  20 Units Subcutaneous Daily  . mouth rinse  15 mL Mouth Rinse 10 times per day  . pantoprazole sodium  40 mg Per Tube Daily  . rocuronium  50 mg Intravenous Once  . sodium chloride flush  10-40 mL Intracatheter Q12H     Madelon Lips MD 05/02/2020, 10:38 AM

## 2020-05-03 ENCOUNTER — Inpatient Hospital Stay (HOSPITAL_COMMUNITY): Payer: Medicare Other

## 2020-05-03 DIAGNOSIS — T796XXS Traumatic ischemia of muscle, sequela: Secondary | ICD-10-CM

## 2020-05-03 DIAGNOSIS — T79A21S Traumatic compartment syndrome of right lower extremity, sequela: Secondary | ICD-10-CM

## 2020-05-03 DIAGNOSIS — G9340 Encephalopathy, unspecified: Secondary | ICD-10-CM

## 2020-05-03 LAB — CBC
HCT: 38.5 % (ref 36.0–46.0)
HCT: 32.8 % — ABNORMAL LOW (ref 36.0–46.0)
Hemoglobin: 13 g/dL (ref 12.0–15.0)
Hemoglobin: 10.7 g/dL — ABNORMAL LOW (ref 12.0–15.0)
MCH: 29.4 pg (ref 26.0–34.0)
MCH: 29.9 pg (ref 26.0–34.0)
MCHC: 33.8 g/dL (ref 30.0–36.0)
MCHC: 32.6 g/dL (ref 30.0–36.0)
MCV: 87.1 fL (ref 80.0–100.0)
MCV: 91.6 fL (ref 80.0–100.0)
Platelets: DECREASED 10*3/uL (ref 150–400)
Platelets: 111 10*3/uL — ABNORMAL LOW (ref 150–400)
RBC: 4.42 MIL/uL (ref 3.87–5.11)
RBC: 3.58 MIL/uL — ABNORMAL LOW (ref 3.87–5.11)
RDW: 15.9 % — ABNORMAL HIGH (ref 11.5–15.5)
RDW: 16.5 % — ABNORMAL HIGH (ref 11.5–15.5)
WBC: 44 10*3/uL — ABNORMAL HIGH (ref 4.0–10.5)
WBC: 47.4 10*3/uL — ABNORMAL HIGH (ref 4.0–10.5)
nRBC: 1.5 % — ABNORMAL HIGH (ref 0.0–0.2)
nRBC: 5 % — ABNORMAL HIGH (ref 0.0–0.2)

## 2020-05-03 LAB — CK: Total CK: 23880 U/L — ABNORMAL HIGH (ref 38–234)

## 2020-05-03 LAB — GLUCOSE, CAPILLARY
Glucose-Capillary: 121 mg/dL — ABNORMAL HIGH (ref 70–99)
Glucose-Capillary: 125 mg/dL — ABNORMAL HIGH (ref 70–99)
Glucose-Capillary: 199 mg/dL — ABNORMAL HIGH (ref 70–99)
Glucose-Capillary: 64 mg/dL — ABNORMAL LOW (ref 70–99)
Glucose-Capillary: 69 mg/dL — ABNORMAL LOW (ref 70–99)
Glucose-Capillary: 74 mg/dL (ref 70–99)
Glucose-Capillary: 81 mg/dL (ref 70–99)
Glucose-Capillary: 99 mg/dL (ref 70–99)

## 2020-05-03 LAB — CULTURE, BLOOD (ROUTINE X 2)
Culture: NO GROWTH
Culture: NO GROWTH

## 2020-05-03 LAB — DIC (DISSEMINATED INTRAVASCULAR COAGULATION)PANEL
D-Dimer, Quant: 2.96 ug/mL-FEU — ABNORMAL HIGH (ref 0.00–0.50)
Fibrinogen: 577 mg/dL — ABNORMAL HIGH (ref 210–475)
INR: 1.5 — ABNORMAL HIGH (ref 0.8–1.2)
Platelets: 120 10*3/uL — ABNORMAL LOW (ref 150–400)
Prothrombin Time: 17.8 seconds — ABNORMAL HIGH (ref 11.4–15.2)
Smear Review: NONE SEEN
aPTT: 34 seconds (ref 24–36)

## 2020-05-03 LAB — RENAL FUNCTION PANEL
Albumin: 2.5 g/dL — ABNORMAL LOW (ref 3.5–5.0)
Albumin: 1.6 g/dL — ABNORMAL LOW (ref 3.5–5.0)
Anion gap: 11 (ref 5–15)
Anion gap: 16 — ABNORMAL HIGH (ref 5–15)
BUN: 20 mg/dL (ref 8–23)
BUN: 22 mg/dL (ref 8–23)
CO2: 22 mmol/L (ref 22–32)
CO2: 16 mmol/L — ABNORMAL LOW (ref 22–32)
Calcium: 7.8 mg/dL — ABNORMAL LOW (ref 8.9–10.3)
Calcium: 7.4 mg/dL — ABNORMAL LOW (ref 8.9–10.3)
Chloride: 105 mmol/L (ref 98–111)
Chloride: 103 mmol/L (ref 98–111)
Creatinine, Ser: 1.43 mg/dL — ABNORMAL HIGH (ref 0.44–1.00)
Creatinine, Ser: 1.71 mg/dL — ABNORMAL HIGH (ref 0.44–1.00)
GFR calc Af Amer: 44 mL/min — ABNORMAL LOW (ref >60)
GFR calc Af Amer: 36 mL/min — ABNORMAL LOW (ref >60)
GFR calc non Af Amer: 38 mL/min — ABNORMAL LOW (ref >60)
GFR calc non Af Amer: 31 mL/min — ABNORMAL LOW (ref >60)
Glucose, Bld: 134 mg/dL — ABNORMAL HIGH (ref 70–99)
Glucose, Bld: 283 mg/dL — ABNORMAL HIGH (ref 70–99)
Phosphorus: 3.3 mg/dL (ref 2.5–4.6)
Phosphorus: 5.7 mg/dL — ABNORMAL HIGH (ref 2.5–4.6)
Potassium: 4.7 mmol/L (ref 3.5–5.1)
Potassium: 5.3 mmol/L — ABNORMAL HIGH (ref 3.5–5.1)
Sodium: 138 mmol/L (ref 135–145)
Sodium: 135 mmol/L (ref 135–145)

## 2020-05-03 LAB — TRIGLYCERIDES: Triglycerides: 152 mg/dL — ABNORMAL HIGH (ref <150)

## 2020-05-03 LAB — HEPATIC FUNCTION PANEL
ALT: 2109 U/L — ABNORMAL HIGH (ref 0–44)
AST: 3512 U/L — ABNORMAL HIGH (ref 15–41)
Albumin: 2.5 g/dL — ABNORMAL LOW (ref 3.5–5.0)
Alkaline Phosphatase: 159 U/L — ABNORMAL HIGH (ref 38–126)
Bilirubin, Direct: 1 mg/dL — ABNORMAL HIGH (ref 0.0–0.2)
Indirect Bilirubin: 1.9 mg/dL — ABNORMAL HIGH (ref 0.3–0.9)
Total Bilirubin: 2.9 mg/dL — ABNORMAL HIGH (ref 0.3–1.2)
Total Protein: 4.6 g/dL — ABNORMAL LOW (ref 6.5–8.1)

## 2020-05-03 LAB — LACTIC ACID, PLASMA
Lactic Acid, Venous: 5.4 mmol/L (ref 0.5–1.9)
Lactic Acid, Venous: 10 mmol/L (ref 0.5–1.9)
Lactic Acid, Venous: 9.2 mmol/L (ref 0.5–1.9)

## 2020-05-03 LAB — MAGNESIUM: Magnesium: 2.5 mg/dL — ABNORMAL HIGH (ref 1.7–2.4)

## 2020-05-03 MED ORDER — LORAZEPAM 2 MG/ML IJ SOLN
2.0000 mg | Freq: Once | INTRAMUSCULAR | Status: AC
  Administered 2020-05-03: 2 mg via INTRAVENOUS
  Filled 2020-05-03: qty 1

## 2020-05-03 MED ORDER — DEXTROSE 50 % IV SOLN
INTRAVENOUS | Status: AC
  Administered 2020-05-03: 50 mL
  Filled 2020-05-03: qty 50

## 2020-05-03 MED ORDER — ALBUMIN HUMAN 5 % IV SOLN
25.0000 g | Freq: Once | INTRAVENOUS | Status: AC
  Administered 2020-05-03: 25 g via INTRAVENOUS
  Filled 2020-05-03: qty 500

## 2020-05-03 MED ORDER — CALCIUM GLUCONATE-NACL 1-0.675 GM/50ML-% IV SOLN
1.0000 g | Freq: Once | INTRAVENOUS | Status: AC
  Administered 2020-05-03: 1000 mg via INTRAVENOUS
  Filled 2020-05-03: qty 50

## 2020-05-03 MED ORDER — SODIUM CHLORIDE 0.9 % IV SOLN
0.5000 mg/h | INTRAVENOUS | Status: DC
  Administered 2020-05-03: 0.5 mg/h via INTRAVENOUS
  Filled 2020-05-03: qty 5

## 2020-05-03 MED ORDER — SODIUM CHLORIDE 0.9 % IV SOLN
2.0000 g | Freq: Two times a day (BID) | INTRAVENOUS | Status: DC
  Administered 2020-05-03: 2 g via INTRAVENOUS
  Filled 2020-05-03: qty 2

## 2020-05-03 MED ORDER — SODIUM CHLORIDE 0.9 % IV SOLN: 2.0000 g | INTRAVENOUS | Status: DC

## 2020-05-03 MED ORDER — ROPINIROLE HCL 1 MG PO TABS
1.0000 mg | ORAL_TABLET | Freq: Every day | ORAL | Status: DC
  Administered 2020-05-03 – 2020-05-04 (×2): 1 mg
  Filled 2020-05-03 (×2): qty 1

## 2020-05-03 MED ORDER — HYDROMORPHONE BOLUS VIA INFUSION
0.2000 mg | INTRAVENOUS | Status: DC | PRN
  Filled 2020-05-03: qty 1

## 2020-05-03 NOTE — Progress Notes (Signed)
PT Cancellation Note  Patient Details Name: [name] MRN: [id number] DOB: 1956-03-18   Cancelled Treatment:    Reason Eval/Treat Not Completed: Medical issues which prohibited therapy.  RN stated hold due to pt not appropriate for therapies today. 05/03/2020  [name], PT Acute Rehabilitation Services [phone]  (pager) [phone]  (office)   [name] Samiya Mervin 05/03/2020, 11:08 AM

## 2020-05-03 NOTE — Progress Notes (Signed)
eLink Physician-Brief Progress Note Patient Name: [name] DOB: 1956-09-23 MRN: [id number]   Date of Service  05/03/2020  HPI/Events of Note  Pt with bilateral lower extremity compartment syndrome s/p right AKA and left fasciotomy, Pt is on 2 pressors, now has increase in lactate to 10 and wbc to 47 K.  eICU Interventions  Albumin 500 ml iv bolus x 1, then repeat Lactate, if Lactate is improved will give an additional 500 ml of 5 % Albumin and attempt to wean pressors, if lactate is worse will order CTAP and CT of bilateral lower extremities. Antibiotics will be broadened given wbc increase to 47 K.        Frederik Pear 05/03/2020, 9:23 PM

## 2020-05-03 NOTE — Progress Notes (Signed)
Patient ID: [name], female   DOB: Apr 22, 1956, 64 y.o.   MRN: [id number]         [place] for Infectious Disease  Date of Admission:  04/27/2020   Total days of antibiotics 4        Day 3 vancomycin        Day 3 cefepime         ASSESSMENT: We still have no clear explanation for why she fell out of bed, developed rhabdomyolysis, lower extremity blisters and now muscle rigidity.  Her daughter tells me that she has been seeing a neurologist at [facility] recently.  She has had problems with a sense of imbalance and dizziness for about 4 months.  She is also undergone treatment for possible vertigo without improvement.  She recently stopped taking ropinirole and started gabapentin about 1 week before becoming ill.  I am not sure that infection is playing a role here but with her recent sepsis physiology and increasing leukocytosis I favor continuing vancomycin and cefepime for now.  PLAN: 1. Continue vancomycin and cefepime 2. Consider neurology evaluation  Principal Problem:   Blisters of multiple sites Active Problems:   Compartment syndrome of right lower extremity (HCC)   Rhabdomyolysis   Respiratory failure (HCC)   Acute renal failure superimposed on stage 3 chronic kidney disease (HCC)   AMS (altered mental status)   Hemorrhagic shock (HCC)   Scheduled Meds: . artificial tears   Right Eye Q8H  . chlorhexidine gluconate (MEDLINE KIT)  15 mL Mouth Rinse BID  . Chlorhexidine Gluconate Cloth  6 each Topical Daily  . docusate  100 mg Oral BID  . feeding supplement (PRO-STAT SUGAR FREE 64)  30 mL Per Tube BID  . fentaNYL (SUBLIMAZE) injection  50 mcg Intravenous Once  . heparin  5,000 Units Subcutaneous Q8H  . insulin aspart  0-15 Units Subcutaneous Q4H  . insulin aspart  2 Units Subcutaneous Q4H  . insulin glargine  20 Units Subcutaneous Daily  . mouth rinse  15 mL Mouth Rinse 10 times per day  . pantoprazole sodium  40 mg Per Tube Daily  . polyethylene glycol  17  g Oral Daily  . rocuronium  50 mg Intravenous Once  . sodium chloride flush  10-40 mL Intracatheter Q12H   Continuous Infusions: .  prismasol BGK 4/2.5 400 mL/hr at 05/03/20 1015  .  prismasol BGK 4/2.5 400 mL/hr at 05/03/20 1022  . sodium chloride Stopped (05/02/20 2159)  . ceFEPime (MAXIPIME) IV    . feeding supplement (VITAL AF 1.2 CAL) 20 mL/hr at 05/02/20 2200  . HYDROmorphone 0.5 mg/hr (05/03/20 1500)  . phenylephrine (NEO-SYNEPHRINE) Adult infusion 25 mcg/min (05/03/20 1500)  . prismasol BGK 4/2.5 2,000 mL/hr at 05/03/20 1438  . propofol (DIPRIVAN) infusion 10 mcg/kg/min (05/02/20 0400)  . vancomycin Stopped (05/03/20 0839)  . vasopressin (PITRESSIN) infusion - *FOR SHOCK* 0.03 Units/min (05/03/20 1500)   PRN Meds:.sodium chloride, acetaminophen **OR** acetaminophen, alteplase, docusate sodium, fentaNYL, heparin, heparin, HYDROmorphone, lip balm, metoprolol tartrate, ondansetron, polyethylene glycol, sodium chloride, sodium chloride flush   Review of Systems: Review of Systems  Unable to perform ROS: Intubated    Allergies  Allergen Reactions  . Olmesartan Other (See Comments)    GFR drop  . Requip [Ropinirole] Other (See Comments)    Overactive bladder  . Valsartan Other (See Comments)    GFR drop    OBJECTIVE: Vitals:   05/03/20 1430 05/03/20 1445 05/03/20 1507 05/03/20 1515  BP: Marland Kitchen)  82/52 91/63  99/63  Pulse: 85 85  83  Resp: (!) 26 (!) 26  (!) 26  Temp:   (!) 95.8 F (35.4 C)   TempSrc:   Axillary   SpO2: 100% 100%  100%  Weight:      Height:       Body mass index is 33.43 kg/m.  Physical Exam Constitutional:      Comments: She remains sedated and on the ventilator.  She is still on pressors.  She has developed stiffness and rigidity in her upper extremities over the last 24 hours.  Cardiovascular:     Rate and Rhythm: Regular rhythm. Tachycardia present.     Heart sounds: No murmur heard.   Pulmonary:     Breath sounds: Normal breath sounds.    Musculoskeletal:     Comments: She has Ace wraps on her left and right legs.  No new blisters have formed since admission.     Lab Results Lab Results  Component Value Date   WBC 44.0 (H) 05/03/2020   HGB 13.0 05/03/2020   HCT 38.5 05/03/2020   MCV 87.1 05/03/2020   PLT  05/03/2020    PLATELET CLUMPS NOTED ON SMEAR, COUNT APPEARS DECREASED   PLT 120 (L) 05/03/2020    Lab Results  Component Value Date   CREATININE 1.43 (H) 05/03/2020   BUN 20 05/03/2020   NA 138 05/03/2020   K 4.7 05/03/2020   CL 105 05/03/2020   CO2 22 05/03/2020    Lab Results  Component Value Date   ALT 2,109 (H) 05/03/2020   AST 3,512 (H) 05/03/2020   ALKPHOS 159 (H) 05/03/2020   BILITOT 2.9 (H) 05/03/2020     Microbiology: Recent Results (from the past 240 hour(s))  Blood culture (routine x 2)     Status: None   Collection Time: 04/22/2020  1:16 PM   Specimen: BLOOD  Result Value Ref Range Status   Specimen Description BLOOD SITE NOT SPECIFIED  Final   Special Requests   Final    BOTTLES DRAWN AEROBIC AND ANAEROBIC Blood Culture results may not be optimal due to an inadequate volume of blood received in culture bottles   Culture   Final    NO GROWTH 5 DAYS Performed at [facility], [place] [address], [place], [place] [address]    Report Status 05/03/2020 FINAL  Final  Blood culture (routine x 2)     Status: None   Collection Time: 05/07/2020  7:32 PM   Specimen: BLOOD  Result Value Ref Range Status   Specimen Description BLOOD SITE NOT SPECIFIED  Final   Special Requests   Final    BOTTLES DRAWN AEROBIC AND ANAEROBIC Blood Culture results may not be optimal due to an inadequate volume of blood received in culture bottles   Culture   Final    NO GROWTH 5 DAYS Performed at [facility], [place] [address] [place], [place] [address]    Report Status 05/03/2020 FINAL  Final  SARS Coronavirus 2 by RT PCR (hospital order, performed in [facility] hospital lab) Nasopharyngeal  Nasopharyngeal Swab     Status: None   Collection Time: 04/11/2020  8:19 PM   Specimen: Nasopharyngeal Swab  Result Value Ref Range Status   SARS Coronavirus 2 NEGATIVE NEGATIVE Final    Comment: (NOTE) SARS-CoV-2 target nucleic acids are NOT DETECTED.  The SARS-CoV-2 RNA is generally detectable in upper and lower respiratory specimens during the acute phase of infection.  The lowest concentration of SARS-CoV-2 viral copies this assay can detect is 250 copies / mL. A negative result does not preclude SARS-CoV-2 infection and should not be used as the sole basis for treatment or other patient management decisions.  A negative result may occur with improper specimen collection / handling, submission of specimen other than nasopharyngeal swab, presence of viral mutation(s) within the areas targeted by this assay, and inadequate number of viral copies (<250 copies / mL). A negative result must be combined with clinical observations, patient history, and epidemiological information.  Fact Sheet for Patients:   StrictlyIdeas.no  Fact Sheet for Healthcare Providers: BankingDealers.co.za  This test is not yet approved or  cleared by the [place] FDA and has been authorized for detection and/or diagnosis of SARS-CoV-2 by FDA under an Emergency Use Authorization (EUA).  This EUA will remain in effect (meaning this test can be used) for the duration of the COVID-19 declaration under Section 564(b)(1) of the Act, 21 U.S.C. section 360bbb-3(b)(1), unless the authorization is terminated or revoked sooner.  Performed at [facility], [place] [address] [place], [place] [address]   MRSA PCR Screening     Status: None   Collection Time: 04/18/2020  6:50 AM   Specimen: Nasopharyngeal  Result Value Ref Range Status   MRSA by PCR NEGATIVE NEGATIVE Final    Comment:        The GeneXpert MRSA Assay (FDA approved for NASAL specimens only), is one  component of a comprehensive MRSA colonization surveillance program. It is not intended to diagnose MRSA infection nor to guide or monitor treatment for MRSA infections. Performed at [facility], [place] [address] [place], [place] [address]     Michel Bickers, [place] [facility] [phone] pager   [phone] cell 05/03/2020, 3:36 PM

## 2020-05-03 NOTE — Progress Notes (Signed)
Attempted to see pt today after AKA. Pt not stable from medical standpoint to be seen. Will attempt back as schedule allows.  Jinger Neighbors, [place] [phone]

## 2020-05-03 NOTE — Procedures (Signed)
Patient Name: [name]  MRN: [id number]  Epilepsy Attending: Lora Havens  Referring Physician/Provider: Dr Karena Addison Aroor Date: 05/03/2020 Duration: 24.48 mins  Patient history: 64yo F with ams and increased tone. EEG to evaluate for seizure  Level of alertness: comatose  AEDs during EEG study: None  Technical aspects: This EEG study was done with scalp electrodes positioned according to the 10-20 International system of electrode placement. Electrical activity was acquired at a sampling rate of 500Hz  and reviewed with a high frequency filter of 70Hz  and a low frequency filter of 1Hz . EEG data were recorded continuously and digitally stored.   Description: EEG showed continuous generalized 3 to 6 Hz theta-delta slowing. Hyperventilation and photic stimulation were not performed.     ABNORMALITY -Continuous slow, generalized  IMPRESSION: This study is suggestive of severe diffuse encephalopathy, nonspecific etiology. No seizures or definite epileptiform discharges were seen throughout the recording.  [name]

## 2020-05-03 NOTE — Progress Notes (Signed)
Pt transported from 2M05 to MRI and back with no complications.

## 2020-05-03 NOTE — Progress Notes (Cosign Needed Addendum)
NAME:  [name], MRN:  [id number], DOB:  Feb 12, 1956, LOS: 5 ADMISSION DATE:  04/25/2020, CONSULTATION DATE: 05/02/2020 REFERRING MD: Anesthesia, CHIEF COMPLAINT:  Respiratory Failure   Brief History   64 year old female admitted on 05/09/2020 following a fall. She was admitted for management of rhabdomyolysis and was later found to have unobtainable doppler signals and mottling of her right foot for which she underwent an emergent thromboembolectomy on 04/13/2020 which was unsuccessful. She had a 4 compartment fasciotomy on 04/29/2020. She was extubated post-operatively but was re-intubated for respiratory failure and found to be profoundly anemic.   History of present illness   [name] is a 64 year old female admitted on 04/17/2020 following a fall. She has a PMH significant for HTN and CKD III. She was found to be in rhabdomyolysis with acute on chronic renal failure, presumed to be from fall, and received IV hydration. Vascular surgery was consulted on 04/16/2020 for mottling of right foot without doppler signals. Therefore, on 04/18/2020, she underwent an unsuccessful emergent thromboembolectomy and 4 compartment fasciotomy. She was started on CRRT for refractory hyperkalemia and metabolic acidosis. She was extubated after the procedure but required re-intubation for hypoxic respiratory failure and profound anemia for which she received 4 units PRBC's. On 6/21, she underwent RLE AKA and was noted to have compartment syndrome of her LLE in which she underwent 4 compartment fasciotomy of LLE.   Past Medical History  HTN CKD III RLS  Significant Hospital Events   6/18 Admitted to IM 6/20 Unsuccessful emergent RLE thromboembolectomy and 4 compartment fasciotomy. Re-intubated and transferred to ICU and started on CRRT. 6/21 RLE AKA, 4 compartment LLE fasciotomy.   Consults:  Vascular PCCM Nephrology  Procedures:  ETT 6/20 >>  Significant Diagnostic Tests:  6/22 Echo - No significant  abnormalities. EF 65-70 6/21 CXR - ETT within 1.8cm from carina. No effusions or PTX. Atelectasis vs infiltrate left lung base 6/18 MRI Brain - No significant findings 6/19 Renal US - Right renal cyst. No hydro Micro Data:  6/18 BC >> NGTD x 5 days 6/18 SARS CoV2 >> Negative  Antimicrobials:  Ancef 6/20 >> 6/20 Cefepime 6/21 >> Vancomycin 6/21 >> Clindamycin 6/21>> 6/22  Interim history/subjective:  Continues on CRRT. Improvement in metabolic acidosis, hyperkalemia, and down trend in CK. She remains on vasopressors.  Objective   Blood pressure (!) 102/56, pulse 77, temperature (!) 95.9 F (35.5 C), temperature source Axillary, resp. rate (!) 26, height 5\' 2"  (1.575 m), weight 82.9 kg, SpO2 97 %.    Vent Mode: PRVC FiO2 (%):  [30 %] 30 % Set Rate:  [26 bmp] 26 bmp Vt Set:  [400 mL] 400 mL PEEP:  [5 cmH20] 5 cmH20 Plateau Pressure:  [13 cmH20-18 cmH20] 13 cmH20   Intake/Output Summary (Last 24 hours) at 05/03/2020 0742 Last data filed at 05/03/2020 0700 Gross per 24 hour  Intake 1752.71 ml  Output 3173 ml  Net -1420.29 ml   Filed Weights   04/25/2020 0500 05/02/20 0500 05/03/20 0500  Weight: 93.1 kg 85.6 kg 82.9 kg    Examination: General: White female laying in bed in no obvious distress on MV HENT: NCAT. MMM. Oral ETT and OGT present Lungs: Normal chest rise and fall on MV. No accessory muscle use. Lungs CTAB Cardiovascular: Normal S1/S2. RRR. NSR on monitor. No murmurs rubs gallops. Abdomen: Obese. Non-tender to palpation. Not distended Extremities: RLE AKA. LLE thigh compartments soft and compressible. Doppler signals obtained in LLE DP and  PT. Rigid BUE flexed with increased muscle tone Neuro: Opens eyes to voice. Nods and shakes head to questions. Follows commands. Right pupil 74mm and non-reactive. Left pupil 24mm and reactive. GU: Foley catheter without and urine output. No suprapubic tenderness  Resolved Hospital Problem list     Assessment & Plan:  Acute on  chronic kidney disease, stage III Metabolic acidosis, non-anion gap -Continue CRRT with fluid removal per nephrology recommendations. -Would consider keeping even given difficulty removing vasoactives -Monitor I/O with foley catheter -Continue to trend electrolytes  Hyperkalemia secondary to rhabdomyolysis in the setting of renal disease Resolved, K 4.1 today.  -Continue CRRT per nephrology recommendations -Continue to trend K with daily renal fx panel  Acute hypoxic respiratory failure Minimal FiO2 and PEEP with adequate oxygenation.  -Continue minimal vent settings as tolerated -Daily SAT/SBT -Plateau pressure < 30 -VAP bundle -Cuff leak prior to extubation as she has been intubated multiple times  Severe rhabdomyolysis secondary to critical limb ischemia CK > 50k, now 23k. Received right AKA and 4 compartment fasciotomy of LLE for compartment syndromes.  -Continue CRRT to help clear CK and myoglobin -Down trending CK. No need to recheck at this point.   Acute blood loss anemia Resolved. Hgb stable 13.1 -Continue to monitor for signs and symptoms of bleeding -Continue to trend Hgb. CBC now. -Transfuse for Hgb < 7   Shock, hemorrhagic vs septic Afebrile. Blood cultured with NGTD x 5 days. I suspect leukocytosis is reactive to her rhabdomyolysis and recent surgeries. She has received 6 units PRBC's this admission. -Continue vanc and cefepime per ID recs -Continue to maintain MAP > 65.  -Check procal  Transaminitis Coagulopathy, thrombocytopenia Likely from shock liver secondary to systemic hypoperfusion. Elevated INR and PTT. -Supportive care -Continue to trend LFT's -Check fibrinogen and d-dimer   Hyperglycemia secondary to critical illness Stable -Continue current insulin regimen -Continue CBG q4h   Best practice:  Diet: TF Pain/Anxiety/Delirium protocol (if indicated): Propofol and Fentanyl VAP protocol (if indicated): Yes DVT prophylaxis: Heparin SQ GI  prophylaxis: PPI Glucose control: SSI + basal + correction Mobility: Bedrest Code Status: Full Family Communication: Pending Disposition: ICU  Labs   CBC: Recent Labs  Lab 04/24/2020 1139 05/07/2020 1816 04/17/2020 0531 04/24/2020 1138 04/24/2020 1715 05/02/20 0046 05/02/20 1048  WBC 28.1*   < > 28.5* 25.0* 16.0* 19.1* 27.1*  NEUTROABS 26.1*  --   --   --   --   --   --   HGB 15.1*   < > 13.0 11.7* 7.6* 12.9 13.1  HCT 48.6*   < > 39.0 35.6* 22.3* 37.5 37.2  MCV 88.0   < > 86.1 86.2 85.1 84.8 85.1  PLT 352   < > 223 172 97* 127* 126*   < > = values in this interval not displayed.    Basic Metabolic Panel: Recent Labs  Lab 04/18/2020 0531 05/10/2020 0531 04/23/2020 1138 05/08/2020 1138 05/02/20 0046 05/02/20 0513 05/02/20 1048 05/02/20 1540 05/03/20 0437  NA 136   < > 135   < > 135 135 136 137 138  K 5.2*   < > 4.7   < > 3.9 3.7 3.9 4.1 4.7  CL 106   < > 105   < > 100 100 101 104 105  CO2 14*   < > 12*   < > 20* 22 21* 21* 22  GLUCOSE 100*   < > 91   < > 309* 251* 116* 111* 134*  BUN 60*   < >  42*   < > 35* 31* 26* 24* 20  CREATININE 4.23*   < > 3.13*   < > 2.31* 2.21* 1.88* 1.69* 1.43*  CALCIUM 6.8*   < > 6.7*   < > 6.7* 6.7* 7.0* 7.3* 7.8*  MG 2.4  --   --   --   --  2.3  --   --  2.5*  PHOS 9.6*  --  8.9*  --   --  5.4*  --  4.0 3.3   < > = values in this interval not displayed.   GFR: Estimated Creatinine Clearance: 39.1 mL/min (A) (by C-G formula based on SCr of 1.43 mg/dL (H)). Recent Labs  Lab 04/15/2020 1138 04/17/2020 1142 04/15/2020 1715 04/11/2020 1715 05/02/20 0046 05/02/20 1044 05/02/20 1048 05/02/20 1721 05/02/20 1935  WBC 25.0*  --  16.0*  --  19.1*  --  27.1*  --   --   LATICACIDVEN  --    < > 6.9*   < > 4.6* 5.8*  --  3.8* 3.2*   < > = values in this interval not displayed.    Liver Function Tests: Recent Labs  Lab 05/08/2020 1139 05/08/2020 1139 04/29/20 0215 04/29/20 0215 04/13/2020 0531 04/16/2020 1138 05/02/20 0513 05/02/20 1540 05/03/20 0437  AST  371*  --  2,253*  --   --  2,016* 7,064*  --  3,512*  ALT 155*  --  977*  --   --  1,723* 3,896*  --  2,109*  ALKPHOS 74  --  57  --   --  64 110  --  159*  BILITOT 1.7*  --  1.4*  --   --  2.9* 2.8*  --  2.9*  PROT 8.0  --  5.4*  --   --  4.0* 3.8*  --  4.6*  ALBUMIN 4.2   < > 2.4*   < > 2.6* 2.2* 2.5*   2.4* 2.6* 2.5*   2.5*   < > = values in this interval not displayed.   Recent Labs  Lab 04/19/2020 1139  LIPASE 29   Recent Labs  Lab 04/26/2020 1636  AMMONIA 22    ABG    Component Value Date/Time   PHART 7.231 (L) 05/08/2020 0000   PCO2ART 30.8 (L) 04/26/2020 0000   PO2ART 282 (H) 05/02/2020 0000   HCO3 13.1 (L) 05/07/2020 0000   TCO2 14 (L) 04/12/2020 0000   ACIDBASEDEF 13.0 (H) 05/07/2020 0000   O2SAT 100.0 04/26/2020 0000     Coagulation Profile: Recent Labs  Lab 05/05/2020 1754 05/02/20 0046 05/02/20 1048  INR 1.1 2.3* 2.0*    Cardiac Enzymes: Recent Labs  Lab 04/29/20 1744 04/24/2020 0233 04/21/2020 0531 05/02/20 0513 05/03/20 0437  CKTOTAL >50,000* >50,000* >50,000* >50,000* 23,880*    HbA1C: Hgb A1c MFr Bld  Date/Time Value Ref Range Status  04/29/2020 02:15 AM 6.3 (H) 4.8 - 5.6 % Final    Comment:    (NOTE) Pre diabetes:          5.7%-6.4%  Diabetes:              >6.4%  Glycemic control for   <7.0% adults with diabetes     CBG: Recent Labs  Lab 05/02/20 1512 05/02/20 1928 05/02/20 2319 05/03/20 0337 05/03/20 0709  GLUCAP 100* 112* 124* 121* 81    Review of Systems:   Unable to obtain 2/2 intuabtion sedation  Past Medical History  She,  has a past medical history of  Balance disorder, Hypertension, and Renal disorder.   Surgical History    Past Surgical History:  Procedure Laterality Date   AMPUTATION Right 04/19/2020   Procedure: RIGHT ABOVE KNEE AMPUTATION;  Surgeon: Marty Heck, MD;  Location: [place];  Service: Vascular;  Laterality: Right;   CESAREAN SECTION     FASCIOTOMY Left 05/08/2020   Procedure: FOUR  COMPARTMENTAL FASCIOTOMY OF LEFT LOWER EXTREMITY;  Surgeon: Marty Heck, MD;  Location: [place];  Service: Vascular;  Laterality: Left;   THROMBECTOMY FEMORAL ARTERY Right 04/22/2020   Procedure: THROMBECTOMY RIGHT POPLITEAL ARTERY; RIGHT ANTERIOR TIBIAL ARTERY AND RIGHT POSTERIOR TIBIAL ARTERY; RIGHT LOWER EXTRMEMITY FOUR COMPARTMENT FASCIOTOMIES;  Surgeon: Waynetta Sandy, MD;  Location: [place];  Service: Vascular;  Laterality: Right;     Social History   reports that she has never smoked. She has never used smokeless tobacco. She reports previous alcohol use. She reports that she does not use drugs.   Family History   Her family history is not on file.   Allergies Allergies  Allergen Reactions   Olmesartan Other (See Comments)    GFR drop   Requip [Ropinirole] Other (See Comments)    Overactive bladder   Valsartan Other (See Comments)    GFR drop     Home Medications  Prior to Admission medications   Medication Sig Start Date End Date Taking? Authorizing Provider  amLODipine (NORVASC) 10 MG tablet Take 10 mg by mouth daily. 04/01/20  Yes [provider]  gabapentin (NEURONTIN) 100 MG capsule Take 100-200 mg by mouth See admin instructions. Take 200 mg by mouth midday and 100 mg two hours before bedtime 04/24/20  Yes [provider]  rOPINIRole (REQUIP) 2 MG tablet Take by mouth. Patient not taking: Reported on 04/24/2020 03/29/20   [provider]     Critical care time: 74 minutes    Electronically signed by: Myrle Sheng, AGACNP-Student 05/03/20 [phone]

## 2020-05-03 NOTE — Consult Note (Addendum)
Neurology Consultation  Reason for Consult: Rigidity and altered mental status Referring Physician: Dr. Ernest Mallick  CC: Rigidity and altered mental status  History is obtained from: Notes  HPI: [name] is a 64 y.o. female with renal disorder chronic kidney disease stage III, hypertension, restless leg syndrome and gait imbalance.  Patient presented to the emergency department after a fall, it is noted that she had spent a few hours on the floor after.  Patient was treated for rhabdomyolysis with IVF and admitted, later developing mottling of the right lower extremity and taken to the OR on 6/20 for thromboembolectomy and compartment fasciitis.  Postoperatively patient was extubated and awake.  She then developed worsening respiration status and was reintubated.  At that time potassium was 7.3, pH 7.0 and patient did require neo for blood pressure.  She was hypocalcemic and did require 4 units of packed red blood cells.  Per nurse, initially patient was following commands however, yesterday her mentation decreased to only nodding appropriately and at this point in time she is not responding to noxious stimuli and or verbal request.  It was also noticed this morning that patient has significant increased tone with arm flexion and knee flexion.  Neurology was asked see patient to evaluate for any additional information that can be given and/or helpful.  There is some information that was gathered stating that she was on Requip but was taken off since hospitalization.  Currently patient is hypothermic with bear hugger blanket, hypotensive on pressors,  ED course   CT head shows-obtained on 04/16/2020 shows no acute intracranial abnormalities.  MRI brain on 04/29/2020 was on remarkable   Pertinent recent labs include: AST 3512 ALT 2109 Alk phos 159 CK trending down from greater than 50,00 to 23,880 White blood cell count has trended significantly upward.  1 day ago 19.1, today  44    Past Medical History:  Diagnosis Date   Balance disorder    Hypertension    Renal disorder    Chronic kidney disease    History reviewed. No pertinent family history.  Social History:   reports that she has never smoked. She has never used smokeless tobacco. She reports previous alcohol use. She reports that she does not use drugs.  Medications  Current Facility-Administered Medications:     prismasol BGK 4/2.5 infusion, , CRRT, Continuous, Madelon Lips, MD, Last Rate: 400 mL/hr at 05/03/20 1015, New Bag at 05/03/20 1015    prismasol BGK 4/2.5 infusion, , CRRT, Continuous, Madelon Lips, MD, Last Rate: 400 mL/hr at 05/03/20 1022, New Bag at 05/03/20 1022   0.9 %  sodium chloride infusion, , Intravenous, PRN, Rhyne, Samantha J, PA-C, Stopped at 05/02/20 2159   acetaminophen (TYLENOL) tablet 650 mg, 650 mg, Oral, Q6H PRN **OR** acetaminophen (TYLENOL) suppository 650 mg, 650 mg, Rectal, Q6H PRN, Rhyne, Samantha J, PA-C   alteplase (CATHFLO ACTIVASE) injection 2 mg, 2 mg, Intracatheter, Once PRN, Rhyne, Samantha J, PA-C   artificial tears (LACRILUBE) ophthalmic ointment, , Right Eye, Q8H, Julian Hy, DO, Given at 05/03/20 1314   ceFEPIme (MAXIPIME) 2 g in sodium chloride 0.9 % 100 mL IVPB, 2 g, Intravenous, Q12H, Millen, Jessica B, RPH   chlorhexidine gluconate (MEDLINE KIT) (PERIDEX) 0.12 % solution 15 mL, 15 mL, Mouth Rinse, BID, Rhyne, Samantha J, PA-C, 15 mL at 05/03/20 0731   Chlorhexidine Gluconate Cloth 2 % PADS 6 each, 6 each, Topical, Daily, Rhyne, Samantha J, PA-C, 6 each at 05/03/20 1000   docusate (  COLACE) 50 MG/5ML liquid 100 mg, 100 mg, Oral, BID, Julian Hy, DO, 100 mg at 05/03/20 5361   docusate sodium (COLACE) capsule 100 mg, 100 mg, Oral, BID PRN, Rhyne, Samantha J, PA-C   feeding supplement (PRO-STAT SUGAR FREE 64) liquid 30 mL, 30 mL, Per Tube, BID, Rhyne, Samantha J, PA-C, 30 mL at 05/03/20 0905   feeding supplement (VITAL AF 1.2  CAL) liquid 1,000 mL, 1,000 mL, Per Tube, Continuous, Rhyne, Samantha J, PA-C, Last Rate: 20 mL/hr at 05/02/20 2200, Rate Verify at 05/02/20 2200   fentaNYL (SUBLIMAZE) bolus via infusion 50 mcg, 50 mcg, Intravenous, Q15 min PRN, Noemi Chapel P, DO   fentaNYL (SUBLIMAZE) injection 50 mcg, 50 mcg, Intravenous, Once, Noemi Chapel P, DO   heparin injection 1,000-6,000 Units, 1,000-6,000 Units, CRRT, PRN, Gabriel Earing, PA-C, 2,800 Units at 04/24/2020 1221   heparin injection 1,500 Units, 1,500 Units, Dialysis, PRN, Rhyne, Samantha J, PA-C   heparin injection 5,000 Units, 5,000 Units, Subcutaneous, Q8H, Rhyne, Samantha J, PA-C, 5,000 Units at 05/03/20 1446   HYDROmorphone (DILAUDID) 50 mg in sodium chloride 0.9 % 100 mL (0.5 mg/mL) infusion, 0.5-4 mg/hr, Intravenous, Continuous, Clark, Venita Sheffield, DO, Last Rate: 1 mL/hr at 05/03/20 1500, 0.5 mg/hr at 05/03/20 1500   HYDROmorphone (DILAUDID) bolus via infusion 0.2 mg, 0.2 mg, Intravenous, Q30 min PRN, Noemi Chapel P, DO   insulin aspart (novoLOG) injection 0-15 Units, 0-15 Units, Subcutaneous, Q4H, Julian Hy, DO, 2 Units at 05/03/20 0434   insulin aspart (novoLOG) injection 2 Units, 2 Units, Subcutaneous, Q4H, Julian Hy, DO, 2 Units at 05/03/20 0434   insulin glargine (LANTUS) injection 20 Units, 20 Units, Subcutaneous, Daily, Julian Hy, DO, 20 Units at 05/02/20 0919   lip balm (CARMEX) ointment, , Topical, PRN, Julian Hy, DO   MEDLINE mouth rinse, 15 mL, Mouth Rinse, 10 times per day, Rhyne, Samantha J, PA-C, 15 mL at 05/03/20 1516   metoprolol tartrate (LOPRESSOR) injection 2-5 mg, 2-5 mg, Intravenous, Q2H PRN, Rhyne, Samantha J, PA-C   ondansetron (ZOFRAN) injection 4 mg, 4 mg, Intravenous, Q6H PRN, Rhyne, Samantha J, PA-C   pantoprazole sodium (PROTONIX) 40 mg/20 mL oral suspension 40 mg, 40 mg, Per Tube, Daily, Rhyne, Samantha J, PA-C, 40 mg at 05/03/20 0915   phenylephrine CONCENTRATED 153m in sodium chloride 0.9%  2599m(0.31m31mL) infusion, 0-400 mcg/min, Intravenous, Titrated, Rhyne, Samantha J, PA-C, Last Rate: 3.75 mL/hr at 05/03/20 1500, 25 mcg/min at 05/03/20 1500   polyethylene glycol (MIRALAX / GLYCOLAX) packet 17 g, 17 g, Oral, Daily PRN, Rhyne, Samantha J, PA-C   polyethylene glycol (MIRALAX / GLYCOLAX) packet 17 g, 17 g, Oral, Daily, ClaJulian HyO, 17 g at 05/03/20 [id number] BGK 4/2.5 infusion, , CRRT, Continuous, UptMadelon LipsD, Last Rate: 2,000 mL/hr at 05/03/20 1438, New Bag at 05/03/20 1438   propofol (DIPRIVAN) 1000 MG/100ML infusion, 0-50 mcg/kg/min, Intravenous, Continuous, Rhyne, Samantha J, PA-C, Last Rate: 5.31 mL/hr at 05/02/20 0400, 10 mcg/kg/min at 05/02/20 0400   rocuronium (ZEMURON) injection 50 mg, 50 mg, Intravenous, Once, Rhyne, Samantha J, PA-C   rOPINIRole (REQUIP) tablet 1 mg, 1 mg, Per Tube, Daily, ClaNoemi Chapel DO   sodium chloride 0.9 % primer fluid for CRRT, , CRRT, PRN, Rhyne, Samantha J, PA-C   sodium chloride flush (NS) 0.9 % injection 10-40 mL, 10-40 mL, Intracatheter, Q12H, Rhyne, Samantha J, PA-C, 10 mL at 05/03/20 0905   sodium chloride flush (NS) 0.9 % injection 10-40  mL, 10-40 mL, Intracatheter, PRN, Rhyne, Samantha J, PA-C   vancomycin (VANCOCIN) IVPB 1000 mg/200 mL premix, 1,000 mg, Intravenous, Q24H, Rhyne, Samantha J, PA-C, Stopped at 05/03/20 0839   vasopressin (PITRESSIN) 40 Units in sodium chloride 0.9 % 250 mL (0.16 Units/mL) infusion, 0.03 Units/min, Intravenous, Continuous, Rhyne, Samantha J, PA-C, Last Rate: 11.25 mL/hr at 05/03/20 1500, 0.03 Units/min at 05/03/20 1500  ROS:   Unable to obtain due to altered mental status.    Exam: Current vital signs: BP (!) 89/63    Pulse 84    Temp (!) 95.8 F (35.4 C) (Axillary)    Resp (!) 23    Ht 5' 2"  (1.575 m)    Wt 82.9 kg    SpO2 100%    BMI 33.43 kg/m  Vital signs in last 24 hours: Temp:  [95.8 F (35.4 C)-97.5 F (36.4 C)] 95.8 F (35.4 C) (06/23 1507) Pulse Rate:   [74-89] 84 (06/23 1600) Resp:  [18-29] 23 (06/23 1600) BP: (78-102)/(52-67) 89/63 (06/23 1600) SpO2:  [88 %-100 %] 100 % (06/23 1600) Arterial Line BP: (83-141)/(49-69) 93/54 (06/23 1130) FiO2 (%):  [30 %] 30 % (06/23 1524) Weight:  [82.9 kg] 82.9 kg (06/23 0500)   Constitutional: Appears well-developed and well-nourished.  Eyes: No scleral injection HENT: No OP obstrucion Head: Normocephalic.  Cardiovascular: Normal rate and regular rhythm.  Respiratory: Intubated GI: Soft.  No distension. There is no tenderness.  Skin: WDI  Neuro: Mental Status: Patient does not respond to verbal stimuli.  Does not respond to deep sternal rub.  Does not follow commands.  No verbalizations noted.  Currently intubated Cranial Nerves: II: patient does not respond confrontation bilaterally, falls right eye III,IV,VI: doll's response absent bilaterally.  Left pupil 2 mm mm, and sluggishly reactive V,VII: corneal reflex present on the right  VIII: patient does not respond to verbal stimuli IX,X: gag reflex absent, XI: trapezius strength unable to test bilaterally XII: tongue strength unable to test Motor: Increased tone bilaterally upper extremities and lower extremities. Sensory: No response to noxious stimuli. Deep Tendon Reflexes:  Trace DTRs throughout however there was also a significant component of increased tone Plantars: Mute on the right and upgoing on the left Cerebellar: Unable to perform  Labs I have reviewed labs in epic and the results pertinent to this consultation are:   CBC    Component Value Date/Time   WBC 44.0 (H) 05/03/2020 0909   RBC 4.42 05/03/2020 0909   HGB 13.0 05/03/2020 0909   HCT 38.5 05/03/2020 0909   PLT  05/03/2020 0909    PLATELET CLUMPS NOTED ON SMEAR, COUNT APPEARS DECREASED   PLT 120 (L) 05/03/2020 0909   MCV 87.1 05/03/2020 0909   MCH 29.4 05/03/2020 0909   MCHC 33.8 05/03/2020 0909   RDW 15.9 (H) 05/03/2020 0909   LYMPHSABS 0.3 (L) 05/08/2020  1139   MONOABS 1.7 (H) 04/17/2020 1139   EOSABS 0.0 04/29/2020 1139   BASOSABS 0.0 04/21/2020 1139    CMP     Component Value Date/Time   NA 138 05/03/2020 0437   K 4.7 05/03/2020 0437   CL 105 05/03/2020 0437   CO2 22 05/03/2020 0437   GLUCOSE 134 (H) 05/03/2020 0437   BUN 20 05/03/2020 0437   CREATININE 1.43 (H) 05/03/2020 0437   CALCIUM 7.8 (L) 05/03/2020 0437   PROT 4.6 (L) 05/03/2020 0437   ALBUMIN 2.5 (L) 05/03/2020 0437   ALBUMIN 2.5 (L) 05/03/2020 0437   AST 3,512 (H) 05/03/2020 4259  ALT 2,109 (H) 05/03/2020 0437   ALKPHOS 159 (H) 05/03/2020 0437   BILITOT 2.9 (H) 05/03/2020 0437   GFRNONAA 38 (L) 05/03/2020 0437   GFRAA 44 (L) 05/03/2020 0437    Lipid Panel     Component Value Date/Time   CHOL 157 04/29/2020 0215   TRIG 152 (H) 05/03/2020 0437   HDL 56 04/29/2020 0215   CHOLHDL 2.8 04/29/2020 0215   VLDL 17 04/29/2020 0215   LDLCALC 84 04/29/2020 0215     Imaging I have reviewed the images obtained:  CT head shows-obtained on 04/29/2020 shows no acute intracranial abnormalities.  MRI brain on 04/23/2020 was on remarkable  Etta Quill PA-C [facility] [phone]  M-F  (9:00 am- 5:00 PM)  05/03/2020, 4:12 PM    NEUROHOSPITALIST ADDENDUM Performed a face to face diagnostic evaluation.    64 year old female with history of CKD, hypertension, restless leg syndrome, gait imbalance admitted to [facility] on 6/18 after presenting with fall and spending several hours on the floor.  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 fasciotomy to 4 of the left lower extremity  Also had septic shock, acute on chronic renal failure requiring CRRT, hemorrhagic shock. She has been on cefepime. CT head and MRI brain on 6/18 was unremarkable-performed to look for etiology for falls.  Patient was being weaned off sedation and was actually  following commands yesterday.  Was requiring minimal sedation with fentanyl which was turned off.  Switched to hydromorphone.  Today morning nurse noticed patient was less responsive and also increased tone bilateral upper extremities that have progressed throughout the day and currently patient is unresponsive.  She received 76m Ativan which helped reduce the rigidity transiently however became rigid again.  Patient is hypothermic as well and BP is low.  Neurology consulted for recommendations regarding new onset posturing  On my examination patient left pupil is sluggish, no oculocephalic reflex.  Minimal cough. Does not withdraw to noxious stimulus, has increased rigidity in bilateral upper extremities, neck rigidity, increased rigidity of left lower extremity.  Patient is not hyperreflexic.   Assessment  -Patient been having falls few days prior to admission.  CKs consistently above 50,000.  Also noted to have multiple blisters on both her legs.  Is also noticed that Requip was discontinued and switched to gabapentin.  While rhabdomyolysis is certainly possibility, patient only down for 3 to 4 hours.  Must consider other causes for elevated CK-such as infectious myositis, autoimmune myositis (less likely due to short duration), ? neuroleptic malignant syndrome due to withdrawal of ropinirole.  We will need to work this up further we will need to see if muscle has been sent for biopsy.   Plan 2 mg of Ativan given stat-improved tone Stat EEG: Negative for seizures shows slowing MRI brain: ordered   Metabolic encephalopathy in setting of uremia, cefepime Increase muscle tone-withdrawal for ropinirole versus NMS (but not hyperthermic)  Recommendations -Stop hydromorphone -Consider switching cefepime to alternative antibiotic -As needed Ativan for significant rigidity -Trend CK, monitor temperature    CRITICAL CARE Performed by: SLanice SchwabAroor   Total critical care time: 50   minutes  Critical care time was exclusive of separately billable procedures and treating other patients.  Critical care was necessary to treat or prevent imminent or life-threatening deterioration.  Critical care was time spent personally by me on the following activities: development of treatment plan with  patient and/or surrogate as well as nursing, discussions with consultants, evaluation of patient's response to treatment, examination of patient, obtaining history from patient or surrogate, ordering and performing treatments and interventions, ordering and review of laboratory studies, ordering and review of radiographic studies, pulse oximetry and re-evaluation of patient's condition.Karena Addison Ermine Stebbins MD [facility] [phone]   If 7pm to 7am, please call on call as listed on AMION.

## 2020-05-03 NOTE — Progress Notes (Signed)
Pt. Extremities started to get progressively rigid throughout morning. Notified CCM and orders were given for RN to do Q1 hr vascular checks.   Pt. Had pulses in all extremities upon morning assessments at 8, 9 , & 10. 11 am assessment showed difficulty finding pulses in R radial. Pt. R brachial a-line working at this time. Bilateral pt. Hands seemed to look more dusky than previously. Vascular Surgeon and CCM came to assess patient and made aware. Ordered for a-line to be taken out.  RN will continue to monitor pt. At this time.

## 2020-05-03 NOTE — Progress Notes (Addendum)
NAME:  [name], MRN:  [id number], DOB:  [birth year], LOS: 5 ADMISSION DATE:  04/23/2020, CONSULTATION DATE:  05/03/20 REFERRING MD:  Anesthesia, CHIEF COMPLAINT:  Respiratory failure   Brief History   64 y.o. F who fell on 6/18 and developed Rhabdo and worsening RLE pain, taken to the OR on 6/20 for thromboembolectomy and fasciotomy, was extubated and then had to be re-intubated and found to be profoundly anemic.  PCCM consulted for transfer to ICU.   History of present illness   64 y.o. F with PMH HTN, CKD and RLS who was initially admitted 6/18 after rolling out bed and being down on the floor for several hours. She was treated for rhabdo with IVF and admitted, later developed mottling of the RLE and taken to the OR 6/20 for thromboembolectomy and compartment fasciotomies. Post-op was extubated and awake, then developed worsening respiratory status and was re-intubated. Labs returned with Hgb 4.2, K 7.3, pH 7.0 and pt requiring Neo gtt.    Per report, EBL 1L intraoperatively.  Patient was given 2 A bicarb, calcium, insulin, 4 units PRBC ordered and transferred to the intensive care unit for the initiation of CRRT.  Past Medical History   has a past medical history of Balance disorder, Hypertension, and Renal disorder.  Significant Hospital Events   6/18 Admit to internal medicine 6/20 > RLE thromboembolectomy and fasciotomy, transfer to ICU 6/21>RLE AKA, LLE fasciotomy x 4   Consults:  Vascular surgery PCCM nephrology  Procedures:  ETT 6/20 6/20 > RLE thromboembolectomy and fasciotomy, transfer to ICU 6/21>RLE AKA, LLE fasciotomy x 4   Significant Diagnostic Tests:  6/18 CT head>> no acute findings 6/18 MRI brain>>unremarkable  Micro Data:  6/18 blood cultures x2>> 6/18 SARS-Covid-2>> negative  Antimicrobials:  Ancef 6/20 Cefepime 6/21> vanc 6/21> clinda 6/21>6/22  Interim history/subjective:  NAEO Remains on neo and vaso, and on CRRT   Objective   Blood  pressure (!) 102/56, pulse 77, temperature (!) 95.9 F (35.5 C), temperature source Axillary, resp. rate (!) 26, height 5\' 2"  (1.575 m), weight 82.9 kg, SpO2 97 %.    Vent Mode: PRVC FiO2 (%):  [30 %] 30 % Set Rate:  [26 bmp] 26 bmp Vt Set:  [400 mL] 400 mL PEEP:  [5 cmH20] 5 cmH20 Plateau Pressure:  [13 cmH20-18 cmH20] 13 cmH20   Intake/Output Summary (Last 24 hours) at 05/03/2020 0738 Last data filed at 05/03/2020 0700 Gross per 24 hour  Intake 1752.71 ml  Output 3173 ml  Net -1420.29 ml   Filed Weights   05/05/2020 0500 05/02/20 0500 05/03/20 0500  Weight: 93.1 kg 85.6 kg 82.9 kg   General: Critically ill appearing middle aged F, intubated sedated on CRRT, NAD  HEENT: NCAT R eye crusted. ETT OGT secure.  Neuro: Lightly sedated. Responds to voice, following commands weakly BUE LLE. Pupil 52mm. No hyper-reflexivity   CV: RRR s1s2 no rgm. Cap refill 2 seconds LLE.  PULM: CTA bilat. Symmetrical chest expansion, mechanically ventilated.  GI: soft round ndnt  Extremities: BUE increased muscle tone, soft compartments. RUE brachial art line RLE AKA. LLE calf fasciotomies with venous pooling. L foot with 2+ pitting edema. Dopplerable L pedal pulse. R thigh dressed.  Skin: Scattered ecchymosis BUE. Scattered open and closed blisters bilateral thighs. Open blisters in xeroform     Resolved Hospital Problem list   Compartment syndrome BLE Acute limb ischemia BLE Hyperphosphatemia hyperkalemia  Assessment & Plan:   Acute hypoxic respiratory failure due to severe  acidosis, encephalopathy requiring MV. Reintubated after respiratory decompemsation in PACU.  P -Cont MV support. Goal Pplat < 30, driving pressure < 15 -WUA/SBT when medically ready -when progressed to extubation readiness, ensure cuff leak (multiple intubations) -VAP, pulm hygiene -PAD: prop, fent  Shock -hemorrhagic shock component largely improved -ongoing shock possibly sepsis however no clear source at this time -echo  without LV  Or RV dysfunction P  -MAP goal > 65, continue pressors for goal. Currently neo and vaso. Would favor levo over neo however pt experienced AFibRVR with levo, so continue neo for now -Appreciate ID assistance with abx -- continues on vanc, cefepime  -Will check CBC, coags 6/23 but doubt that we will find significant interval drop in hgb   ABLA, improved Hemorrhagic shock, improved -in setting of LLE fasciotomies, RLE thrombectomy, RLE AKA  Received 6 units pRBCs this admission P -Trend CBC -- ordering 6/23 and tomorrow 6/24 -Transfuse if Hgb < 7  Compartment syndrome BLE due to rhabdo, s/p AKA on R after unsuccessful fasciotomy on R, fasciotomy on L -appreciate vascular surgery's assistance -need to prioritize adequate peripheral perfusion to optimize healing/ recovery of LLE -con't frequent neurovascular checks  Acute on chronic renal failure  Rhabdomyolysis  -Ck down to 24000 from >33007 Severe metabolic acidosis, improving  Lactic acidosis- resolving P -CRRT per nephrology  -Strict I/Os, foley  -cont to monitor electrolytes   Acute metabolic encephalopathy due to critical illness, AKI -Minimize sedation as able, on fent gtt  -Delirium precautions   Increased muscle tone BUE  -Ca and Mag ok -Not hyper reflexive -- doubt SS -BUE compartments soft but pulse progressively thready   P - trial of BZD in event this is muscle spasm -close neurovasc checks of BUE   Skin bullae, likely due to compartment syndrome P -Appreciate ID following -cont abx -cont to follow culture data -xeroform to bullae   Transaminitis -shock liver  Coagulopathy, mild Thrombocytopenia, mild P -check DIC panel, CBC   Hyperglycemia, likely 2/2 critical illness P -SSI PRN Q4h -adding TF coverage -adding lantus 20 units daily -goal BG 140-180 while in ICU     Best practice:  Diet: TF Pain/Anxiety/Delirium protocol (if indicated): Fentanyl/propofol VAP protocol (if  indicated): HOB 30 degrees, suction as needed DVT prophylaxis: Heparin  GI prophylaxis: Protonix Glucose control: SSI, basal bolus insulin Mobility: Bedrest Code Status: Full code Family Communication: pending 6/23 Disposition: ICU  Labs   CBC: Recent Labs  Lab 05/08/2020 1139 04/23/2020 1816 04/26/2020 0531 05/09/2020 1138 04/19/2020 1715 05/02/20 0046 05/02/20 1048  WBC 28.1*   < > 28.5* 25.0* 16.0* 19.1* 27.1*  NEUTROABS 26.1*  --   --   --   --   --   --   HGB 15.1*   < > 13.0 11.7* 7.6* 12.9 13.1  HCT 48.6*   < > 39.0 35.6* 22.3* 37.5 37.2  MCV 88.0   < > 86.1 86.2 85.1 84.8 85.1  PLT 352   < > 223 172 97* 127* 126*   < > = values in this interval not displayed.    Basic Metabolic Panel: Recent Labs  Lab 04/19/2020 0531 04/16/2020 0531 04/24/2020 1138 04/16/2020 1138 05/02/20 0046 05/02/20 0513 05/02/20 1048 05/02/20 1540 05/03/20 0437  NA 136   < > 135   < > 135 135 136 137 138  K 5.2*   < > 4.7   < > 3.9 3.7 3.9 4.1 4.7  CL 106   < > 105   < >  100 100 101 104 105  CO2 14*   < > 12*   < > 20* 22 21* 21* 22  GLUCOSE 100*   < > 91   < > 309* 251* 116* 111* 134*  BUN 60*   < > 42*   < > 35* 31* 26* 24* 20  CREATININE 4.23*   < > 3.13*   < > 2.31* 2.21* 1.88* 1.69* 1.43*  CALCIUM 6.8*   < > 6.7*   < > 6.7* 6.7* 7.0* 7.3* 7.8*  MG 2.4  --   --   --   --  2.3  --   --  2.5*  PHOS 9.6*  --  8.9*  --   --  5.4*  --  4.0 3.3   < > = values in this interval not displayed.   GFR: Estimated Creatinine Clearance: 39.1 mL/min (A) (by C-G formula based on SCr of 1.43 mg/dL (H)). Recent Labs  Lab 04/27/2020 1138 04/22/2020 1142 05/05/2020 1715 04/15/2020 1715 05/02/20 0046 05/02/20 1044 05/02/20 1048 05/02/20 1721 05/02/20 1935  WBC 25.0*  --  16.0*  --  19.1*  --  27.1*  --   --   LATICACIDVEN  --    < > 6.9*   < > 4.6* 5.8*  --  3.8* 3.2*   < > = values in this interval not displayed.    Liver Function Tests: Recent Labs  Lab 05/10/2020 1139 04/14/2020 1139 04/29/20 0215  04/29/20 0215 04/25/2020 0531 05/02/2020 1138 05/02/20 0513 05/02/20 1540 05/03/20 0437  AST 371*  --  2,253*  --   --  2,016* 7,064*  --  3,512*  ALT 155*  --  977*  --   --  1,723* 3,896*  --  2,109*  ALKPHOS 74  --  57  --   --  64 110  --  159*  BILITOT 1.7*  --  1.4*  --   --  2.9* 2.8*  --  2.9*  PROT 8.0  --  5.4*  --   --  4.0* 3.8*  --  4.6*  ALBUMIN 4.2   < > 2.4*   < > 2.6* 2.2* 2.5*  2.4* 2.6* 2.5*  2.5*   < > = values in this interval not displayed.   Recent Labs  Lab 04/16/2020 1139  LIPASE 29   Recent Labs  Lab 04/23/2020 1636  AMMONIA 22    ABG    Component Value Date/Time   PHART 7.231 (L) 04/22/2020 0000   PCO2ART 30.8 (L) 04/23/2020 0000   PO2ART 282 (H) 05/05/2020 0000   HCO3 13.1 (L) 04/11/2020 0000   TCO2 14 (L) 05/05/2020 0000   ACIDBASEDEF 13.0 (H) 04/19/2020 0000   O2SAT 100.0 05/04/2020 0000     Coagulation Profile: Recent Labs  Lab 04/19/2020 1754 05/02/20 0046 05/02/20 1048  INR 1.1 2.3* 2.0*    Cardiac Enzymes: Recent Labs  Lab 04/29/20 1744 05/05/2020 0233 05/05/2020 0531 05/02/20 0513 05/03/20 0437  CKTOTAL >50,000* >50,000* >50,000* >50,000* 23,880*    HbA1C: Hgb A1c MFr Bld  Date/Time Value Ref Range Status  04/29/2020 02:15 AM 6.3 (H) 4.8 - 5.6 % Final    Comment:    (NOTE) Pre diabetes:          5.7%-6.4%  Diabetes:              >6.4%  Glycemic control for   <7.0% adults with diabetes     CBG: Recent Labs  Lab  05/02/20 1512 05/02/20 1928 05/02/20 2319 05/03/20 0337 05/03/20 0709  GLUCAP 100* 112* 124* 121* 81    CRITICAL CARE Performed by: Cristal Generous   Total critical care time: 40 minutes  Critical care time was exclusive of separately billable procedures and treating other patients. Critical care was necessary to treat or prevent imminent or life-threatening deterioration.  Critical care was time spent personally by me on the following activities: development of treatment plan with patient and/or  surrogate as well as nursing, discussions with consultants, evaluation of patient's response to treatment, examination of patient, obtaining history from patient or surrogate, ordering and performing treatments and interventions, ordering and review of laboratory studies, ordering and review of radiographic studies, pulse oximetry and re-evaluation of patient's condition.   Eliseo Gum MSN, AGACNP-BC [place] [phone] If no answer, [phone] 05/03/2020, 7:40 AM

## 2020-05-03 NOTE — Progress Notes (Signed)
Stat  EEG complete - results pending.  

## 2020-05-03 NOTE — Progress Notes (Addendum)
Progress Note    05/03/2020 8:14 AM 3 Days Post-Op  Subjective: The patient remains intubated.  Awake on vent.  She is examined at the bedside with her attendant RN as well as pulmonary medicine NP.  Her nurse has noted involuntary flexion of upper extremities and dorsiflexion of left foot. Requiring blood pressure support and CRRT.  Vitals:   05/03/20 0715 05/03/20 0720  BP:  (!) 102/56  Pulse:  77  Resp: (!) 23 (!) 26  Temp:    SpO2: 100% 97%    Physical Exam: Cardiac: Rate and rhythm are regular Lungs: Ventilated Extremities:   Left lower extremity Ace wrap/dressings are removed.  Minimal movement of left toes on command.  Left foot is edematous, 2+ pitting. Passive left foot dorsiflexion. I can actively extend her foot. There is no edema of the left lower leg.  Muscle is beefy red in appearance.  There is a brisk dorsalis pedis Doppler signal.  2+ right femoral pulse. Right lower extremity dressings are removed.  Desquamation of proximal medial thigh.  Her staple line is intact.  Residual limb edematous.  Her posterior flap appears slightly dusky.  Slightly cool to touch.         CBC    Component Value Date/Time   WBC 27.1 (H) 05/02/2020 1048   RBC 4.37 05/02/2020 1048   HGB 13.1 05/02/2020 1048   HCT 37.2 05/02/2020 1048   PLT 126 (L) 05/02/2020 1048   MCV 85.1 05/02/2020 1048   MCH 30.0 05/02/2020 1048   MCHC 35.2 05/02/2020 1048   RDW 15.7 (H) 05/02/2020 1048   LYMPHSABS 0.3 (L) 04/11/2020 1139   MONOABS 1.7 (H) 05/07/2020 1139   EOSABS 0.0 05/07/2020 1139   BASOSABS 0.0 04/29/2020 1139    BMET    Component Value Date/Time   NA 138 05/03/2020 0437   K 4.7 05/03/2020 0437   CL 105 05/03/2020 0437   CO2 22 05/03/2020 0437   GLUCOSE 134 (H) 05/03/2020 0437   BUN 20 05/03/2020 0437   CREATININE 1.43 (H) 05/03/2020 0437   CALCIUM 7.8 (L) 05/03/2020 0437   GFRNONAA 38 (L) 05/03/2020 0437   GFRAA 44 (L) 05/03/2020 0437   Transfusion: 6 u Pcs, 1 u  FFP, 1 plt pheresis pack CK: down to 23,880 from >50,000 Cr:   Intake/Output Summary (Last 24 hours) at 05/03/2020 0814 Last data filed at 05/03/2020 0700 Gross per 24 hour  Intake 1584.74 ml  Output 2974 ml  Net -1389.26 ml    HOSPITAL MEDICATIONS Scheduled Meds: . artificial tears   Right Eye Q8H  . chlorhexidine gluconate (MEDLINE KIT)  15 mL Mouth Rinse BID  . Chlorhexidine Gluconate Cloth  6 each Topical Daily  . docusate  100 mg Oral BID  . feeding supplement (PRO-STAT SUGAR FREE 64)  30 mL Per Tube BID  . fentaNYL (SUBLIMAZE) injection  50 mcg Intravenous Once  . heparin  5,000 Units Subcutaneous Q8H  . insulin aspart  0-15 Units Subcutaneous Q4H  . insulin aspart  2 Units Subcutaneous Q4H  . insulin glargine  20 Units Subcutaneous Daily  . mouth rinse  15 mL Mouth Rinse 10 times per day  . pantoprazole sodium  40 mg Per Tube Daily  . polyethylene glycol  17 g Oral Daily  . rocuronium  50 mg Intravenous Once  . sodium chloride flush  10-40 mL Intracatheter Q12H   Continuous Infusions: .  prismasol BGK 4/2.5 400 mL/hr at 05/02/20 2144  .  prismasol  BGK 4/2.5 400 mL/hr at 05/02/20 2143  . sodium chloride Stopped (05/02/20 2159)  . ceFEPime (MAXIPIME) IV Stopped (05/02/20 2235)  . feeding supplement (VITAL AF 1.2 CAL) 20 mL/hr at 05/02/20 2200  . fentaNYL infusion INTRAVENOUS 75 mcg/hr (05/03/20 0700)  . phenylephrine (NEO-SYNEPHRINE) Adult infusion 20 mcg/min (05/03/20 0700)  . prismasol BGK 4/2.5 2,000 mL/hr at 05/03/20 0630  . propofol (DIPRIVAN) infusion 10 mcg/kg/min (05/02/20 0400)  . vancomycin 1,000 mg (05/03/20 0731)  . vasopressin (PITRESSIN) infusion - *FOR SHOCK* 0.03 Units/min (05/03/20 0700)   PRN Meds:.sodium chloride, acetaminophen **OR** acetaminophen, alteplase, docusate sodium, fentaNYL, fentaNYL (SUBLIMAZE) injection, fentaNYL (SUBLIMAZE) injection, heparin, heparin, lip balm, metoprolol tartrate, ondansetron, polyethylene glycol, sodium chloride,  sodium chloride flush  Assessment: 64 y.o. female is s/p right lower extremity 4 compartment fasciotomy and thromboembolectomy where no clot was returned.  She ultimately required right above-knee amputation for worsening CKs and hypertension.  She was later noted to have left lower extremity compartment syndrome and underwent 4 compartment fasciotomies on the left.  CK and creatinine trending downward.  CBC  this morning is pending  Plan: -Will discuss with Dr. Donzetta Matters regarding closure of fasciotomy sites left lower extremity. -Continue to monitor right residual limb. -DVT prophylaxis: Heparin subcutaneously   Risa Grill, PA-C [facility] [phone] 05/03/2020  8:14 AM   I have independently interviewed and examined patient and agree with PA assessment and plan above.  I did not evaluate the wounds in the left lower extremity due to ongoing issues.  These were changed this morning.  We can consider bedside closure of the lateral possibly tomorrow in the medial in the future.  She is unfortunately having ischemic changes of her bilateral hands.  She does have a radial artery signal on the left side the right side she has a brachial A-line I cannot find any signals at the wrist.  I recommended removal of the brachial A-line.  She has not required thrombectomy of her other extremities they were primarily ischemic due to compartment syndrome with unknown etiology.  Breckin Savannah C. Donzetta Matters, MD [facility] [place] Office: [phone] Pager: [phone]

## 2020-05-03 NOTE — Progress Notes (Signed)
[facility] Progress Note   Assessment/ Plan:   1. AoCKD 3- b/l creat 1.3 in March 2021, eGFR 46. Found down prior to admission and presented w/ AKI , oliguric w/ CPK >50,000 indicating significant rhabdomyolysis. Renal US w/o hydro. On CRRT, started 04/25/2020 in setting of postop K of 7.3, resp distress, intubation.  No heparin.   Continue all 4K fluids, net neg 50 mL/ hr for now.  No signs of renal recovery.  2. Hypertonicity/ rigidity: see discussion below under subjective.  Resolved with Ativan, will give 1 g Ca gluconate, would check iCal as well.     3. 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  Appreciate VVS. ? If she needs a hypercoag workup  4. Coagulopathy: DIC panel in process  5. Acute blood loss anemia: received multiple units pRBCs in OR  6. Hyperkalemia -resolved  7. Shock: hemorrhagic +/- septic--> on pressor per PCCM, ID consulted, initially on vanc/ clinda/ cefepime, now de-escalated to vanc/ cefepime  8. Acute hypoxic RF: intubated, per PCCM  9. Metabolic acidosis: transiently on bicarb gtt, better now.  10. Dispo: in ICU  Subjective:    Noted this AM is hypertonicity which is new.  MAR reviewed- no obvious culprit meds (no antimuscarinics, phenergan, SSRIs/ SNRIs), clinical picture not consistent with NMS/ serotonin syndrome.  Ativan given with resolution of symptoms.  Ca low but corrects with albumin.  Received many citrated blood products, has rhabdo--> will give more Ca gluconate, would also check iCal too.  Think nonconvulsive status less likely.   Objective:   BP (!) 102/56   Pulse 76   Temp (!) 95.9 F (35.5 C) (Axillary)   Resp (!) 26   Ht _0  (1.575 m)   Wt 82.9 kg   SpO2 100%   BMI 33.43 kg/m   Physical Exam: Gen: intubated,  NAD CVS: RRR Resp: mech bilaterally Abd: soft Ext: s/p R AKA, dressed, s/p L fascitomies wrapped.  + hypertonicity of bilateral upper extremities with increased flexion of L foot. ACCESS: R IJ nontunneled HD cath  Labs: BMET Recent Labs  Lab 04/20/2020 0233 04/29/2020 1214 04/15/2020 0531 04/22/2020 1138 05/02/20 0046 05/02/20 0513 05/02/20 1048 05/02/20 1540 05/03/20 0437  NA 132*   < > 136 135 135 135 136 137 138  K 5.2*   < > 5.2* 4.7 3.9 3.7 3.9 4.1 4.7  CL 103   < > 106 105 100 100 101 104 105  CO2 16*   < > 14* 12* 20* 22 21* 21* 22  GLUCOSE 129*   < > 100* 91 309* 251* 116* 111* 134*  BUN 79*   < > 60* 42* 35* 31* 26* 24* 20  CREATININE 5.27*   < > 4.23* 3.13* 2.31* 2.21* 1.88* 1.69* 1.43*  CALCIUM 6.5*   < > 6.8* 6.7* 6.7* 6.7* 7.0* 7.3* 7.8*  PHOS 9.7*  --  9.6* 8.9*  --  5.4*  --  4.0 3.3   < > = values in this interval not displayed.   CBC Recent Labs  Lab 04/26/2020 1139 04/27/2020 1816 04/17/2020 1138 04/24/2020 1715 05/02/20 0046 05/02/20 1048  WBC 28.1*   < > 25.0* 16.0* 19.1* 27.1*  NEUTROABS 26.1*  --   --   --   --   --   HGB 15.1*   < >  11.7* 7.6* 12.9 13.1  HCT 48.6*   < > 35.6* 22.3* 37.5 37.2  MCV 88.0   < > 86.2 85.1 84.8 85.1  PLT 352   < > 172 97* 127* 126*   < > = values in this interval not displayed.      Medications:    . artificial tears   Right Eye Q8H  . chlorhexidine gluconate (MEDLINE KIT)  15 mL Mouth Rinse BID  . Chlorhexidine Gluconate Cloth  6 each Topical Daily  . docusate  100 mg Oral BID  . feeding supplement (PRO-STAT SUGAR FREE 64)  30 mL Per Tube BID  . fentaNYL (SUBLIMAZE) injection  50 mcg Intravenous Once  . heparin  5,000 Units Subcutaneous Q8H  . insulin aspart  0-15 Units Subcutaneous Q4H  . insulin aspart  2 Units Subcutaneous Q4H  . insulin glargine  20 Units Subcutaneous Daily  . mouth rinse  15 mL Mouth Rinse 10 times per day  . pantoprazole sodium  40 mg Per Tube Daily  . polyethylene glycol  17 g Oral Daily  .  rocuronium  50 mg Intravenous Once  . sodium chloride flush  10-40 mL Intracatheter Q12H     Madelon Lips MD 05/03/2020, 9:33 AM

## 2020-05-04 ENCOUNTER — Inpatient Hospital Stay (HOSPITAL_COMMUNITY): Payer: Medicare Other

## 2020-05-04 DIAGNOSIS — I7090 Unspecified atherosclerosis: Secondary | ICD-10-CM

## 2020-05-04 DIAGNOSIS — R0989 Other specified symptoms and signs involving the circulatory and respiratory systems: Secondary | ICD-10-CM

## 2020-05-04 DIAGNOSIS — R579 Shock, unspecified: Secondary | ICD-10-CM

## 2020-05-04 DIAGNOSIS — R197 Diarrhea, unspecified: Secondary | ICD-10-CM

## 2020-05-04 DIAGNOSIS — R4182 Altered mental status, unspecified: Secondary | ICD-10-CM

## 2020-05-04 DIAGNOSIS — R609 Edema, unspecified: Secondary | ICD-10-CM

## 2020-05-04 LAB — RENAL FUNCTION PANEL
Albumin: 3.2 g/dL — ABNORMAL LOW (ref 3.5–5.0)
Albumin: 2.9 g/dL — ABNORMAL LOW (ref 3.5–5.0)
Albumin: 1.9 g/dL — ABNORMAL LOW (ref 3.5–5.0)
Anion gap: 14 (ref 5–15)
Anion gap: 14 (ref 5–15)
Anion gap: 20 — ABNORMAL HIGH (ref 5–15)
BUN: 22 mg/dL (ref 8–23)
BUN: 24 mg/dL — ABNORMAL HIGH (ref 8–23)
BUN: 20 mg/dL (ref 8–23)
CO2: 17 mmol/L — ABNORMAL LOW (ref 22–32)
CO2: 19 mmol/L — ABNORMAL LOW (ref 22–32)
CO2: 15 mmol/L — ABNORMAL LOW (ref 22–32)
Calcium: 7.9 mg/dL — ABNORMAL LOW (ref 8.9–10.3)
Calcium: 8 mg/dL — ABNORMAL LOW (ref 8.9–10.3)
Calcium: 7.7 mg/dL — ABNORMAL LOW (ref 8.9–10.3)
Chloride: 107 mmol/L (ref 98–111)
Chloride: 105 mmol/L (ref 98–111)
Chloride: 104 mmol/L (ref 98–111)
Creatinine, Ser: 1.61 mg/dL — ABNORMAL HIGH (ref 0.44–1.00)
Creatinine, Ser: 1.41 mg/dL — ABNORMAL HIGH (ref 0.44–1.00)
Creatinine, Ser: 1.43 mg/dL — ABNORMAL HIGH (ref 0.44–1.00)
GFR calc Af Amer: 38 mL/min — ABNORMAL LOW (ref >60)
GFR calc Af Amer: 45 mL/min — ABNORMAL LOW (ref >60)
GFR calc Af Amer: 44 mL/min — ABNORMAL LOW (ref >60)
GFR calc non Af Amer: 33 mL/min — ABNORMAL LOW (ref >60)
GFR calc non Af Amer: 39 mL/min — ABNORMAL LOW (ref >60)
GFR calc non Af Amer: 38 mL/min — ABNORMAL LOW (ref >60)
Glucose, Bld: 105 mg/dL — ABNORMAL HIGH (ref 70–99)
Glucose, Bld: 111 mg/dL — ABNORMAL HIGH (ref 70–99)
Glucose, Bld: 86 mg/dL (ref 70–99)
Phosphorus: 4 mg/dL (ref 2.5–4.6)
Phosphorus: 4.3 mg/dL (ref 2.5–4.6)
Phosphorus: 6.5 mg/dL — ABNORMAL HIGH (ref 2.5–4.6)
Potassium: 4.7 mmol/L (ref 3.5–5.1)
Potassium: 5.1 mmol/L (ref 3.5–5.1)
Potassium: 5.5 mmol/L — ABNORMAL HIGH (ref 3.5–5.1)
Sodium: 138 mmol/L (ref 135–145)
Sodium: 138 mmol/L (ref 135–145)
Sodium: 139 mmol/L (ref 135–145)

## 2020-05-04 LAB — CBC
HCT: 27.5 % — ABNORMAL LOW (ref 36.0–46.0)
HCT: 30.9 % — ABNORMAL LOW (ref 36.0–46.0)
HCT: 24.6 % — ABNORMAL LOW (ref 36.0–46.0)
HCT: 17.9 % — ABNORMAL LOW (ref 36.0–46.0)
Hemoglobin: 9 g/dL — ABNORMAL LOW (ref 12.0–15.0)
Hemoglobin: 10 g/dL — ABNORMAL LOW (ref 12.0–15.0)
Hemoglobin: 7.6 g/dL — ABNORMAL LOW (ref 12.0–15.0)
Hemoglobin: 5.6 g/dL — CL (ref 12.0–15.0)
MCH: 30.1 pg (ref 26.0–34.0)
MCH: 29.4 pg (ref 26.0–34.0)
MCH: 29.9 pg (ref 26.0–34.0)
MCH: 29.9 pg (ref 26.0–34.0)
MCHC: 32.7 g/dL (ref 30.0–36.0)
MCHC: 32.4 g/dL (ref 30.0–36.0)
MCHC: 30.9 g/dL (ref 30.0–36.0)
MCHC: 31.3 g/dL (ref 30.0–36.0)
MCV: 92 fL (ref 80.0–100.0)
MCV: 90.9 fL (ref 80.0–100.0)
MCV: 96.9 fL (ref 80.0–100.0)
MCV: 95.7 fL (ref 80.0–100.0)
Platelets: 109 10*3/uL — ABNORMAL LOW (ref 150–400)
Platelets: 115 10*3/uL — ABNORMAL LOW (ref 150–400)
Platelets: 94 10*3/uL — ABNORMAL LOW (ref 150–400)
Platelets: 75 10*3/uL — ABNORMAL LOW (ref 150–400)
RBC: 2.99 MIL/uL — ABNORMAL LOW (ref 3.87–5.11)
RBC: 3.4 MIL/uL — ABNORMAL LOW (ref 3.87–5.11)
RBC: 2.54 MIL/uL — ABNORMAL LOW (ref 3.87–5.11)
RBC: 1.87 MIL/uL — ABNORMAL LOW (ref 3.87–5.11)
RDW: 16.6 % — ABNORMAL HIGH (ref 11.5–15.5)
RDW: 16.7 % — ABNORMAL HIGH (ref 11.5–15.5)
RDW: 17.3 % — ABNORMAL HIGH (ref 11.5–15.5)
RDW: 17.2 % — ABNORMAL HIGH (ref 11.5–15.5)
WBC: 47.3 10*3/uL — ABNORMAL HIGH (ref 4.0–10.5)
WBC: 51 10*3/uL (ref 4.0–10.5)
WBC: 50.6 10*3/uL (ref 4.0–10.5)
WBC: 37.3 10*3/uL — ABNORMAL HIGH (ref 4.0–10.5)
nRBC: 7.9 % — ABNORMAL HIGH (ref 0.0–0.2)
nRBC: 8.8 % — ABNORMAL HIGH (ref 0.0–0.2)
nRBC: 11 % — ABNORMAL HIGH (ref 0.0–0.2)
nRBC: 15.8 % — ABNORMAL HIGH (ref 0.0–0.2)

## 2020-05-04 LAB — LACTIC ACID, PLASMA
Lactic Acid, Venous: 8.7 mmol/L (ref 0.5–1.9)
Lactic Acid, Venous: >11 mmol/L (ref 0.5–1.9)
Lactic Acid, Venous: >11 mmol/L (ref 0.5–1.9)

## 2020-05-04 LAB — PROTIME-INR
INR: 3.2 — ABNORMAL HIGH (ref 0.8–1.2)
INR: 2.7 — ABNORMAL HIGH (ref 0.8–1.2)
Prothrombin Time: 31.6 seconds — ABNORMAL HIGH (ref 11.4–15.2)
Prothrombin Time: 27.5 seconds — ABNORMAL HIGH (ref 11.4–15.2)

## 2020-05-04 LAB — POCT I-STAT EG7
Acid-base deficit: 3 mmol/L — ABNORMAL HIGH (ref 0.0–2.0)
Bicarbonate: 23.5 mmol/L (ref 20.0–28.0)
Calcium, Ion: 1.14 mmol/L — ABNORMAL LOW (ref 1.15–1.40)
HCT: 31 % — ABNORMAL LOW (ref 36.0–46.0)
Hemoglobin: 10.5 g/dL — ABNORMAL LOW (ref 12.0–15.0)
O2 Saturation: 54 %
Patient temperature: 97.5
Potassium: 4.8 mmol/L (ref 3.5–5.1)
Sodium: 139 mmol/L (ref 135–145)
TCO2: 25 mmol/L (ref 22–32)
pCO2, Ven: 44.1 mmHg (ref 44.0–60.0)
pH, Ven: 7.332 (ref 7.250–7.430)
pO2, Ven: 30 mmHg — CL (ref 32.0–45.0)

## 2020-05-04 LAB — GLUCOSE, CAPILLARY
Glucose-Capillary: 111 mg/dL — ABNORMAL HIGH (ref 70–99)
Glucose-Capillary: 112 mg/dL — ABNORMAL HIGH (ref 70–99)
Glucose-Capillary: 114 mg/dL — ABNORMAL HIGH (ref 70–99)
Glucose-Capillary: 116 mg/dL — ABNORMAL HIGH (ref 70–99)
Glucose-Capillary: 118 mg/dL — ABNORMAL HIGH (ref 70–99)
Glucose-Capillary: 151 mg/dL — ABNORMAL HIGH (ref 70–99)
Glucose-Capillary: 40 mg/dL — CL (ref 70–99)
Glucose-Capillary: 68 mg/dL — ABNORMAL LOW (ref 70–99)
Glucose-Capillary: 71 mg/dL (ref 70–99)

## 2020-05-04 LAB — COMPREHENSIVE METABOLIC PANEL
ALT: 1088 U/L — ABNORMAL HIGH (ref 0–44)
AST: 1791 U/L — ABNORMAL HIGH (ref 15–41)
Albumin: 2.9 g/dL — ABNORMAL LOW (ref 3.5–5.0)
Alkaline Phosphatase: 168 U/L — ABNORMAL HIGH (ref 38–126)
Anion gap: 13 (ref 5–15)
BUN: 21 mg/dL (ref 8–23)
CO2: 21 mmol/L — ABNORMAL LOW (ref 22–32)
Calcium: 8 mg/dL — ABNORMAL LOW (ref 8.9–10.3)
Chloride: 103 mmol/L (ref 98–111)
Creatinine, Ser: 1.35 mg/dL — ABNORMAL HIGH (ref 0.44–1.00)
GFR calc Af Amer: 48 mL/min — ABNORMAL LOW (ref >60)
GFR calc non Af Amer: 41 mL/min — ABNORMAL LOW (ref >60)
Glucose, Bld: 118 mg/dL — ABNORMAL HIGH (ref 70–99)
Potassium: 5.3 mmol/L — ABNORMAL HIGH (ref 3.5–5.1)
Sodium: 137 mmol/L (ref 135–145)
Total Bilirubin: 4.6 mg/dL — ABNORMAL HIGH (ref 0.3–1.2)
Total Protein: 4.3 g/dL — ABNORMAL LOW (ref 6.5–8.1)

## 2020-05-04 LAB — DIC (DISSEMINATED INTRAVASCULAR COAGULATION)PANEL
D-Dimer, Quant: 3.37 ug/mL-FEU — ABNORMAL HIGH (ref 0.00–0.50)
Fibrinogen: 427 mg/dL (ref 210–475)
INR: 2.1 — ABNORMAL HIGH (ref 0.8–1.2)
Platelets: 109 10*3/uL — ABNORMAL LOW (ref 150–400)
Prothrombin Time: 23 seconds — ABNORMAL HIGH (ref 11.4–15.2)
Smear Review: NONE SEEN
aPTT: 45 seconds — ABNORMAL HIGH (ref 24–36)

## 2020-05-04 LAB — MAGNESIUM: Magnesium: 2.5 mg/dL — ABNORMAL HIGH (ref 1.7–2.4)

## 2020-05-04 LAB — HEPATIC FUNCTION PANEL
ALT: 1210 U/L — ABNORMAL HIGH (ref 0–44)
AST: 1856 U/L — ABNORMAL HIGH (ref 15–41)
Albumin: 3.2 g/dL — ABNORMAL LOW (ref 3.5–5.0)
Alkaline Phosphatase: 148 U/L — ABNORMAL HIGH (ref 38–126)
Bilirubin, Direct: 1.3 mg/dL — ABNORMAL HIGH (ref 0.0–0.2)
Indirect Bilirubin: 1.8 mg/dL — ABNORMAL HIGH (ref 0.3–0.9)
Total Bilirubin: 3.1 mg/dL — ABNORMAL HIGH (ref 0.3–1.2)
Total Protein: 4.5 g/dL — ABNORMAL LOW (ref 6.5–8.1)

## 2020-05-04 LAB — C DIFFICILE (CDIFF) QUICK SCRN (NO PCR REFLEX)
C Diff antigen: NEGATIVE
C Diff interpretation: NOT DETECTED
C Diff toxin: NEGATIVE

## 2020-05-04 LAB — HEPARIN LEVEL (UNFRACTIONATED): Heparin Unfractionated: 0.1 IU/mL — ABNORMAL LOW (ref 0.30–0.70)

## 2020-05-04 LAB — APTT
aPTT: 45 seconds — ABNORMAL HIGH (ref 24–36)
aPTT: 77 seconds — ABNORMAL HIGH (ref 24–36)

## 2020-05-04 LAB — PREPARE RBC (CROSSMATCH)

## 2020-05-04 LAB — ANTITHROMBIN III: AntiThromb III Func: 49 % — ABNORMAL LOW (ref 75–120)

## 2020-05-04 LAB — FIBRINOGEN: Fibrinogen: 216 mg/dL (ref 210–475)

## 2020-05-04 LAB — SURGICAL PATHOLOGY

## 2020-05-04 LAB — CK: Total CK: 9257 U/L — ABNORMAL HIGH (ref 38–234)

## 2020-05-04 LAB — VANCOMYCIN, TROUGH: Vancomycin Tr: 12 ug/mL — ABNORMAL LOW (ref 15–20)

## 2020-05-04 LAB — LIPASE, BLOOD: Lipase: 67 U/L — ABNORMAL HIGH (ref 11–51)

## 2020-05-04 LAB — AMMONIA: Ammonia: 31 umol/L (ref 9–35)

## 2020-05-04 LAB — TRIGLYCERIDES: Triglycerides: 78 mg/dL (ref <150)

## 2020-05-04 MED ORDER — "THROMBI-PAD 3""X3"" EX PADS"
1.0000 | MEDICATED_PAD | Freq: Once | CUTANEOUS | Status: AC
  Administered 2020-05-04 (×2): 2 via TOPICAL
  Filled 2020-05-04: qty 4

## 2020-05-04 MED ORDER — HYDROCORTISONE NA SUCCINATE PF 100 MG IJ SOLR
100.0000 mg | Freq: Three times a day (TID) | INTRAMUSCULAR | Status: DC
  Administered 2020-05-04: 100 mg via INTRAVENOUS
  Filled 2020-05-04: qty 2

## 2020-05-04 MED ORDER — ALBUMIN HUMAN 5 % IV SOLN
25.0000 g | Freq: Once | INTRAVENOUS | Status: AC
  Administered 2020-05-04: 25 g via INTRAVENOUS
  Filled 2020-05-04: qty 500

## 2020-05-04 MED ORDER — LACTATED RINGERS IV BOLUS
1000.0000 mL | Freq: Once | INTRAVENOUS | Status: AC
  Administered 2020-05-04: 1000 mL via INTRAVENOUS

## 2020-05-04 MED ORDER — FENTANYL CITRATE (PF) 100 MCG/2ML IJ SOLN
INTRAMUSCULAR | Status: AC
  Administered 2020-05-04: 12.5 ug via INTRAVENOUS
  Filled 2020-05-04: qty 2

## 2020-05-04 MED ORDER — ROPINIROLE HCL 1 MG PO TABS
2.0000 mg | ORAL_TABLET | Freq: Every day | ORAL | Status: DC
  Administered 2020-05-05: 2 mg
  Filled 2020-05-04: qty 2

## 2020-05-04 MED ORDER — VITAL AF 1.2 CAL PO LIQD
1000.0000 mL | ORAL | Status: DC
  Administered 2020-05-04: 1000 mL

## 2020-05-04 MED ORDER — LACTATED RINGERS IV BOLUS
500.0000 mL | Freq: Once | INTRAVENOUS | Status: AC
  Administered 2020-05-04: 500 mL via INTRAVENOUS

## 2020-05-04 MED ORDER — DEXTROSE 50 % IV SOLN
INTRAVENOUS | Status: AC
  Filled 2020-05-04: qty 50

## 2020-05-04 MED ORDER — FENTANYL CITRATE (PF) 100 MCG/2ML IJ SOLN: 12.5000 ug | Freq: Once | INTRAMUSCULAR | Status: AC

## 2020-05-04 MED ORDER — CALCIUM GLUCONATE-NACL 1-0.675 GM/50ML-% IV SOLN
1.0000 g | Freq: Once | INTRAVENOUS | Status: AC
  Administered 2020-05-04: 1000 mg via INTRAVENOUS
  Filled 2020-05-04: qty 50

## 2020-05-04 MED ORDER — VANCOMYCIN HCL 1250 MG/250ML IV SOLN
1250.0000 mg | INTRAVENOUS | Status: DC
  Administered 2020-05-05: 1250 mg via INTRAVENOUS
  Filled 2020-05-04 (×2): qty 250

## 2020-05-04 MED ORDER — SODIUM CHLORIDE 0.9 % IV SOLN
500.0000 mg | Freq: Two times a day (BID) | INTRAVENOUS | Status: DC
  Administered 2020-05-04 – 2020-05-06 (×4): 500 mg via INTRAVENOUS
  Filled 2020-05-04 (×5): qty 4

## 2020-05-04 MED ORDER — PRISMASOL BGK 0/2.5 32-2.5 MEQ/L IV SOLN
INTRAVENOUS | Status: DC
  Filled 2020-05-04 (×3): qty 5000

## 2020-05-04 MED ORDER — AMIODARONE HCL IN DEXTROSE 360-4.14 MG/200ML-% IV SOLN
60.0000 mg/h | INTRAVENOUS | Status: AC
  Administered 2020-05-04: 30 mg/h via INTRAVENOUS
  Administered 2020-05-04 (×2): 60 mg/h via INTRAVENOUS
  Filled 2020-05-04: qty 200

## 2020-05-04 MED ORDER — SODIUM BICARBONATE 8.4 % IV SOLN: 50.0000 meq | Freq: Once | INTRAVENOUS | Status: AC

## 2020-05-04 MED ORDER — SODIUM CHLORIDE 0.9% IV SOLUTION: Freq: Once | INTRAVENOUS | Status: AC

## 2020-05-04 MED ORDER — ALBUMIN HUMAN 5 % IV SOLN
INTRAVENOUS | Status: AC
  Administered 2020-05-04: 12.5 g via INTRAVENOUS
  Filled 2020-05-04: qty 250

## 2020-05-04 MED ORDER — ALBUMIN HUMAN 5 % IV SOLN
12.5000 g | Freq: Once | INTRAVENOUS | Status: AC
  Administered 2020-05-04: 12.5 g via INTRAVENOUS
  Filled 2020-05-04: qty 250

## 2020-05-04 MED ORDER — ALBUMIN HUMAN 5 % IV SOLN: 12.5000 g | Freq: Once | INTRAVENOUS | Status: AC

## 2020-05-04 MED ORDER — DEXTROSE 5 % IV SOLN: INTRAVENOUS | Status: DC

## 2020-05-04 MED ORDER — PIPERACILLIN-TAZOBACTAM 3.375 G IVPB 30 MIN
3.3750 g | Freq: Four times a day (QID) | INTRAVENOUS | Status: DC
  Administered 2020-05-04 – 2020-05-06 (×6): 3.375 g via INTRAVENOUS
  Filled 2020-05-04 (×8): qty 50

## 2020-05-04 MED ORDER — HEPARIN BOLUS VIA INFUSION
3000.0000 [IU] | Freq: Once | INTRAVENOUS | Status: AC
  Administered 2020-05-04: 3000 [IU] via INTRAVENOUS
  Filled 2020-05-04: qty 3000

## 2020-05-04 MED ORDER — VANCOMYCIN HCL 1500 MG/300ML IV SOLN
1500.0000 mg | Freq: Once | INTRAVENOUS | Status: AC
  Administered 2020-05-04: 1500 mg via INTRAVENOUS
  Filled 2020-05-04: qty 300

## 2020-05-04 MED ORDER — AMIODARONE LOAD VIA INFUSION
150.0000 mg | Freq: Once | INTRAVENOUS | Status: AC
  Administered 2020-05-04: 150 mg via INTRAVENOUS
  Filled 2020-05-04: qty 83.34

## 2020-05-04 MED ORDER — AMIODARONE HCL IN DEXTROSE 360-4.14 MG/200ML-% IV SOLN
30.0000 mg/h | INTRAVENOUS | Status: DC
  Filled 2020-05-04: qty 200

## 2020-05-04 MED ORDER — SODIUM BICARBONATE 8.4 % IV SOLN
INTRAVENOUS | Status: AC
  Administered 2020-05-04: 50 meq via INTRAVENOUS
  Filled 2020-05-04: qty 50

## 2020-05-04 MED ORDER — HEPARIN (PORCINE) 25000 UT/250ML-% IV SOLN
1100.0000 [IU]/h | INTRAVENOUS | Status: DC
  Administered 2020-05-04: 1100 [IU]/h via INTRAVENOUS
  Filled 2020-05-04: qty 250

## 2020-05-04 NOTE — Progress Notes (Signed)
Inpatient Diabetes Program Recommendations  AACE/ADA: New Consensus Statement on Inpatient Glycemic Control (2015)  Target Ranges:  Prepandial:   less than 140 mg/dL      Peak postprandial:   less than 180 mg/dL (1-2 hours)      Critically ill patients:  140 - 180 mg/dL   Lab Results  Component Value Date   GLUCAP 114 (H) 05/04/2020   HGBA1C 6.3 (H) 04/29/2020    Review of Glycemic Control Results for [name], [name] (MRN [id number]) as of 05/04/2020 12:43  Ref. Range 05/04/2020 07:19 05/04/2020 07:58 05/04/2020 11:27 05/04/2020 11:32  Glucose-Capillary Latest Ref Range: 70 - 99 mg/dL 68 (L) 111 (H) 35 (LL) 114 (H)   Diabetes history: PreDM Outpatient Diabetes medications: none Current orders for Inpatient glycemic control: Novolog 0-15 units Q4H Vital decreased to 25 ml/hr Lantus 20 units DCd  Inpatient Diabetes Program Recommendations:    Noted hypoglycemic episodes. Basal insulin discontinued. Vital decreased due to possible illeus. WBC up to 51.0 LA up to 10; question if acidosis vs sepsis maybe contributing to hypoglycemia? Consider adding D5% @ 75 ml/hr.   Thanks, Bronson Curb, MSN, RNC-OB Diabetes Coordinator [phone] (8a-5p)

## 2020-05-04 NOTE — Progress Notes (Cosign Needed)
NAME:  [name], MRN:  [id number], DOB:  [birth year], LOS: 6 ADMISSION DATE:  05/04/2020, CONSULTATION DATE: 05/02/2020 REFERRING MD: Anesthesia, CHIEF COMPLAINT:  Respiratory Failure   Brief History   64 year old female admitted on 04/14/2020 following a fall. She was admitted for management of rhabdomyolysis and was later found to have unobtainable doppler signals and mottling of her right foot for which she underwent an emergent thromboembolectomy on 04/24/2020 which was unsuccessful. She had a 4 compartment fasciotomy on 04/26/2020. She was extubated post-operatively but was re-intubated for respiratory failure and found to be profoundly anemic.   History of present illness   [name] is a 64 year old female admitted on 04/16/2020 following a fall. She has a PMH significant for HTN and CKD III. She was found to be in rhabdomyolysis with acute on chronic renal failure, presumed to be from fall, and received IV hydration. Vascular surgery was consulted on 05/09/2020 for mottling of right foot without doppler signals. Therefore, on 04/25/2020, she underwent an unsuccessful emergent thromboembolectomy and 4 compartment fasciotomy. She was started on CRRT for refractory hyperkalemia and metabolic acidosis. She was extubated after the procedure but required re-intubation for hypoxic respiratory failure and profound anemia for which she received 4 units PRBC's. On 6/21, she underwent RLE AKA and was noted to have compartment syndrome of her LLE in which she underwent 4 compartment fasciotomy of LLE.   Past Medical History  HTN CKD III RLS  Significant Hospital Events   6/18 Admitted to IM 6/20 Unsuccessful emergent RLE thromboembolectomy and 4 compartment fasciotomy. Re-intubated and transferred to ICU and started on CRRT. 6/21 RLE AKA, 4 compartment LLE fasciotomy.  6/23 Neuro consult for BUE rigidity. Remains on CRRT   Consults:  Vascular PCCM Nephrology  Procedures:  ETT 6/20 >>  Significant  Diagnostic Tests:   6/24 CXR - Low lung volumes and patchy opacities in bases reflecting atelectasis, airspace disease, or edema.   6/23 CT Abd/Pelvis - No evidence of bowel ischemia, diffusely fluid-filled small and large bowel. Possible solid lesion arising from lower right kidney. Right nephrolithiasis.   6/23 RUQ [place] - Negative for gallstones or biliary dilatation. Hepatic steatosis  6/23 MRI - Normal aging brain  6/23 EEG - Severe diffuse encephalopathy. No seizures or epileptiform discharges  6/22 Echo - No significant abnormalities. EF 65-70  6/18 MRI Brain - No significant findings  6/19 Renal US - Right renal cyst. No hydro  Micro Data:  6/18 BC >> NGTD x 5 days 6/18 SARS CoV2 >> Negative  Antimicrobials:  Ancef 6/20 >> 6/20 Cefepime 6/21 >> Vancomycin 6/21 >> Clindamycin 6/21>> 6/22  Interim history/subjective:  Neurology was consulted yesterday for increased muscle tone and rigidity of BUE and decreased mentation. Overnight, she developed worsening lactic acidosis and increasing pressor requirements. She had an EEG which showed severe diffuse encephalopathy and an MRI yesterday which were negative. CT Abd/Pelvis was negative for any ischemic etiology. CT of BLE did not reveal any further compartment syndrome.  Objective   Blood pressure (!) 103/59, pulse 81, temperature 98 F (36.7 C), temperature source Axillary, resp. rate (!) 23, height 5\' 2"  (1.575 m), weight 82.5 kg, SpO2 100 %.    Vent Mode: PRVC FiO2 (%):  [30 %-40 %] 40 % Set Rate:  [26 bmp] 26 bmp Vt Set:  [400 mL] 400 mL PEEP:  [5 cmH20] 5 cmH20 Plateau Pressure:  [13 cmH20-16 cmH20] 16 cmH20   Intake/Output Summary (Last 24 hours) at  05/04/2020 0713 Last data filed at 05/04/2020 0700 Gross per 24 hour  Intake 2675.83 ml  Output 2390 ml  Net 285.83 ml   Filed Weights   05/02/20 0500 05/03/20 0500 05/04/20 0500  Weight: 85.6 kg 82.9 kg 82.5 kg    Examination: General: Elderly white female laying  in bed on MV HENT: 8.0 ETT in place. MMM. Non-icteric sclera Lungs: Lungs CTAB. No accessory muscle use. Initiating spontaneous breaths on PS on MV. Cardiovascular: Normal S1 and S2. No carotid bruits. No murmurs or rubs. Delayed capillary refill in BUE. Abdomen: Non-tender to palpation. Firm but not rigid abdomen. Obese and not distended. Extremities: RLE AKA and LLE wrapped in ACE wrap. Compartments in proximal BLE soft and compressible.  Neuro: Does not open eyes or follow commands. Right pupil non-reactive. Left pupil pinpoint. No corneal reflexes. GU: Deferred  Resolved Hospital Problem list     Assessment & Plan:  Encephalopathy Following commands yesterday, now off all sedation and withdraws to pain but does not follow commands. MRI negative. EEG with diffuse encephalopathy / no seizures.   -Check ammonia -Will check VBG  -Minimize sedating medications -Check thiamine  Acute on chronic kidney disease, stage III Potassium, phosphorus, magnesium stable today. BUN/Cr improving. Received two doses of albumin overnight for labile blood pressure, would consider keeping even.  -Continue CRRT with fluid removal per nephrology recommendations  -Monitor I/O with foley catheter  -Continue to trend electrolytes.   Lactic acidosis Worsening lactate overnight to 10.0, now 8.7 most recently. Worsening tCO2 suggestive of acidosis. EEG and CT/Abd pelvis negative for seizures or mesenteric ischemia. Delayed capillary refill with undopplerable radial pulse in RUE following removal of right brachial arterial line.   -Check VBG -Check thiamine -Right radial [place]  Acute hypoxic respiratory failure Minimal FiO2 and PEEP with adequate oxygenation. Now on PS  -Continue minimal vent settings as tolerated -Daily SAT/SBT - Mentation currently precludes extubation -Plateau pressure < 30 -VAP bundle -Cuff leak prior to extubation as she has been intubated multiple times  Severe rhabdomyolysis  secondary to critical limb ischemia Initial CK > 50k, now improved to 9k. Received right AKA and 4 compartment fasciotomy of LLE for compartment syndromes.   -Continue CRRT to help clear CK and myoglobin -Down trending CK, no need to recheck.   Acute blood loss anemia Down trending H/H. No evidence of acute bleeding. Negative MRI and CT Abd/Pelvis. No gross GI bleeding from rectal tube.   -Continue to monitor for signs and symptoms of bleeding -Continue to trend Hgb.  -Transfuse for Hgb < 7   Suspected septic shock Remains afebrile with significantly increasing leukocytosis, WBC now 47.3. Blood cultures have been negative x 5 days. Cefepime and Vanco since 6/21.   -Continue vanc and cefepime. Appreciate ID recs.  -Continue to maintain MAP > 65. Change Neo to Levo today.   Transaminitis Coagulopathy, thrombocytopenia Likely from shock liver secondary to systemic hypoperfusion. Improvement in LFT's. Elevated INR and PTT.  -Supportive care -Continue to trend LFT's -Rechecking DIC panel today  Hyperglycemia secondary to critical illness Some episodes of hypoglycemia overnight.   -Discontinue basal and correction -Keep SSI -Check CBG q4h and continue to trend   Best practice:  Diet: TF Pain/Anxiety/Delirium protocol (if indicated): Hydromorphone PRN; Avoid VAP protocol (if indicated): Yes DVT prophylaxis: Heparin SQ GI prophylaxis: PPI Glucose control: SSI Mobility: Bedrest Code Status: Full Family Communication: Pending Disposition: ICU  Labs   CBC: Recent Labs  Lab 05/04/2020 1139 05/07/2020 1816 05/02/20 0046 05/02/20  1048 05/03/20 0909 05/03/20 1922 05/04/20 0146  WBC 28.1*   < > 19.1* 27.1* 44.0* 47.4* 47.3*  NEUTROABS 26.1*  --   --   --   --   --   --   HGB 15.1*   < > 12.9 13.1 13.0 10.7* 9.0*  HCT 48.6*   < > 37.5 37.2 38.5 32.8* 27.5*  MCV 88.0   < > 84.8 85.1 87.1 91.6 92.0  PLT 352   < > 127* 126* 120*  PLATELET CLUMPS NOTED ON SMEAR, COUNT APPEARS  DECREASED 111* 109*   < > = values in this interval not displayed.    Basic Metabolic Panel: Recent Labs  Lab 04/14/2020 0531 04/29/2020 1138 05/02/20 0513 05/02/20 0513 05/02/20 1048 05/02/20 1540 05/03/20 0437 05/03/20 1922 05/04/20 0146 05/04/20 0148  NA 136   < > 135   < > 136 137 138 135  --  138  K 5.2*   < > 3.7   < > 3.9 4.1 4.7 5.3*  --  4.7  CL 106   < > 100   < > 101 104 105 103  --  107  CO2 14*   < > 22   < > 21* 21* 22 16*  --  17*  GLUCOSE 100*   < > 251*   < > 116* 111* 134* 283*  --  105*  BUN 60*   < > 31*   < > 26* 24* 20 22  --  22  CREATININE 4.23*   < > 2.21*   < > 1.88* 1.69* 1.43* 1.71*  --  1.61*  CALCIUM 6.8*   < > 6.7*   < > 7.0* 7.3* 7.8* 7.4*  --  7.9*  MG 2.4  --  2.3  --   --   --  2.5*  --  2.5*  --   PHOS 9.6*   < > 5.4*  --   --  4.0 3.3 5.7*  --  4.0   < > = values in this interval not displayed.   GFR: Estimated Creatinine Clearance: 34.7 mL/min (A) (by C-G formula based on SCr of 1.61 mg/dL (H)). Recent Labs  Lab 05/02/20 1048 05/02/20 1721 05/02/20 1935 05/03/20 0909 05/03/20 1225 05/03/20 1922 05/03/20 2229 05/04/20 0130 05/04/20 0146  WBC 27.1*  --   --  44.0*  --  47.4*  --   --  47.3*  LATICACIDVEN  --    < >   < >  --  5.4* 10.0* 9.2* 8.7*  --    < > = values in this interval not displayed.    Liver Function Tests: Recent Labs  Lab 04/29/20 0215 04/29/2020 0531 04/12/2020 1138 04/17/2020 1138 05/02/20 0513 05/02/20 0513 05/02/20 1540 05/03/20 0437 05/03/20 1922 05/04/20 0146 05/04/20 0148  AST 2,253*  --  2,016*  --  7,064*  --   --  3,512*  --  1,856*  --   ALT 977*  --  1,723*  --  3,896*  --   --  2,109*  --  1,210*  --   ALKPHOS 57  --  64  --  110  --   --  159*  --  148*  --   BILITOT 1.4*  --  2.9*  --  2.8*  --   --  2.9*  --  3.1*  --   PROT 5.4*  --  4.0*  --  3.8*  --   --  4.6*  --  4.5*  --   ALBUMIN 2.4*   < > 2.2*   < > 2.5*  2.4*   < > 2.6* 2.5*  2.5* 1.6* 3.2* 3.2*   < > = values in this interval  not displayed.   Recent Labs  Lab 05/04/2020 1139  LIPASE 29   Recent Labs  Lab 04/12/2020 1636  AMMONIA 22    ABG    Component Value Date/Time   PHART 7.231 (L) 05/05/2020 0000   PCO2ART 30.8 (L) 04/14/2020 0000   PO2ART 282 (H) 04/22/2020 0000   HCO3 13.1 (L) 04/13/2020 0000   TCO2 14 (L) 05/10/2020 0000   ACIDBASEDEF 13.0 (H) 04/16/2020 0000   O2SAT 100.0 05/05/2020 0000     Coagulation Profile: Recent Labs  Lab 04/23/2020 1754 05/02/20 0046 05/02/20 1048 05/03/20 0909  INR 1.1 2.3* 2.0* 1.5*    Cardiac Enzymes: Recent Labs  Lab 04/12/2020 0233 04/13/2020 0531 05/02/20 0513 05/03/20 0437 05/03/20 2229  CKTOTAL >50,000* >50,000* >50,000* 23,880* 9,257*    HbA1C: Hgb A1c MFr Bld  Date/Time Value Ref Range Status  04/29/2020 02:15 AM 6.3 (H) 4.8 - 5.6 % Final    Comment:    (NOTE) Pre diabetes:          5.7%-6.4%  Diabetes:              >6.4%  Glycemic control for   <7.0% adults with diabetes     CBG: Recent Labs  Lab 05/03/20 1938 05/03/20 2304 05/03/20 2351 05/04/20 0019 05/04/20 0301  GLUCAP 199* 74 64* 151* 116*    Review of Systems:   Unable to obtain 2/2 intuabtion sedation  Past Medical History  She,  has a past medical history of Balance disorder, Hypertension, and Renal disorder.   Surgical History    Past Surgical History:  Procedure Laterality Date  . AMPUTATION Right 04/18/2020   Procedure: RIGHT ABOVE KNEE AMPUTATION;  Surgeon: Marty Heck, MD;  Location: [facility];  Service: Vascular;  Laterality: Right;  . CESAREAN SECTION    . FASCIOTOMY Left 04/23/2020   Procedure: FOUR COMPARTMENTAL FASCIOTOMY OF LEFT LOWER EXTREMITY;  Surgeon: Marty Heck, MD;  Location: [facility];  Service: Vascular;  Laterality: Left;  . THROMBECTOMY FEMORAL ARTERY Right 04/11/2020   Procedure: THROMBECTOMY RIGHT POPLITEAL ARTERY; RIGHT ANTERIOR TIBIAL ARTERY AND RIGHT POSTERIOR TIBIAL ARTERY; RIGHT LOWER EXTRMEMITY FOUR COMPARTMENT  FASCIOTOMIES;  Surgeon: Waynetta Sandy, MD;  Location: [facility];  Service: Vascular;  Laterality: Right;     Social History   reports that she has never smoked. She has never used smokeless tobacco. She reports previous alcohol use. She reports that she does not use drugs.   Family History   Her family history is not on file.   Allergies Allergies  Allergen Reactions  . Olmesartan Other (See Comments)    GFR drop  . Requip [Ropinirole] Other (See Comments)    Overactive bladder  . Valsartan Other (See Comments)    GFR drop     Home Medications  Prior to Admission medications   Medication Sig Start Date End Date Taking? Authorizing Provider  amLODipine (NORVASC) 10 MG tablet Take 10 mg by mouth daily. 04/01/20  Yes [provider]  gabapentin (NEURONTIN) 100 MG capsule Take 100-200 mg by mouth See admin instructions. Take 200 mg by mouth midday and 100 mg two hours before bedtime 04/24/20  Yes [provider]  rOPINIRole (REQUIP) 2 MG tablet Take by mouth.  Patient not taking: Reported on 04/21/2020 03/29/20   [provider]     Critical care time: 70 minutes    Electronically signed by: Myrle Sheng, AGACNP-Student 05/04/20 0820

## 2020-05-04 NOTE — Progress Notes (Signed)
Dr. Servando Snare, VS, paged about pt's left leg fasciotomy site continuously bleeding with thrombi pads applied.  Will wrap the bleeding site tight with ace wraps and he will come assess her shortly.

## 2020-05-04 NOTE — Progress Notes (Signed)
Pharmacy Antibiotic Note  [name] is a 64 y.o. female admitted on 04/29/2020 who continues on antibiotics while ruling out infection among other reasons for acute illness. Pharmacy has been consulted for Vanc dosing. Cefepime discontinued today and now adding zosyn. She is noted on CRRT and is also on pressors -cultures- negative   Plan: - Continue Vancomycin  1250 mg IV every 24 hours - Zosyn 3.375gm IV q6h - Monitor dialysis, renal function, clinical status, C&S and vanc levels as needed  Height: 5\' 2"  (157.5 cm) Weight: 82.5 kg (181 lb 14.1 oz) IBW/kg (Calculated) : 50.1  Temp (24hrs), Avg:97.6 F (36.4 C), Min:97.4 F (36.3 C), Max:98 F (36.7 C)  Recent Labs  Lab 05/02/20 1048 05/02/20 1540 05/02/20 1935 05/03/20 0437 05/03/20 0909 05/03/20 1225 05/03/20 1922 05/03/20 2229 05/04/20 0130 05/04/20 0146 05/04/20 0148 05/04/20 0759 05/04/20 0923 05/04/20 1209 05/04/20 1422  WBC 27.1*  --   --   --  44.0*  --  47.4*  --   --  47.3*  --   --  51.0*  --   --   CREATININE 1.88*   < >  --  1.43*  --   --  1.71*  --   --   --  1.61*  --   --  1.35* 1.41*  LATICACIDVEN  --    < > 3.2*  --   --  5.4* 10.0* 9.2* 8.7*  --   --   --   --   --   --   VANCOTROUGH  --   --   --   --   --   --   --   --   --   --   --  12*  --   --   --    < > = values in this interval not displayed.    Estimated Creatinine Clearance: 39.6 mL/min (A) (by C-G formula based on SCr of 1.41 mg/dL (H)).    Allergies  Allergen Reactions  . Olmesartan Other (See Comments)    GFR drop  . Requip [Ropinirole] Other (See Comments)    Overactive bladder  . Valsartan Other (See Comments)    GFR drop    Zosyn 6/24>> Vanc 6/21 >>  Cefepime 6/21 >>6/24  Clinda 6/21 >6/22  6/21 MRSA PCR: neg 6/18 Bcx: Neg  Thank you for allowing pharmacy to be a part of this patient's care.  Hildred Laser, PharmD Clinical Pharmacist **Pharmacist phone directory can now be found on [email or web address] ([email or web address]).   Listed under [place].

## 2020-05-04 NOTE — Progress Notes (Signed)
Patient disconnected from CRRT machine d/t CT trip.  CRRT machine is recirculating.

## 2020-05-04 NOTE — Progress Notes (Signed)
Finger stick CBGs have not been accurate today. Per MD, Dr. Carlis Abbott, it is okay to draw blood back from peripheral IV if nothing is infusing. Therefore, CBGs should be tested with blood pulled from PIV.   No plans for an alternative central venous access at this point due to suspicion for infection.  Dewaine Oats, RN

## 2020-05-04 NOTE — Progress Notes (Signed)
ANTICOAGULATION CONSULT NOTE - Initial Consult  Pharmacy Consult for Heparin Indication: RUE DVT  Allergies  Allergen Reactions  . Olmesartan Other (See Comments)    GFR drop  . Requip [Ropinirole] Other (See Comments)    Overactive bladder  . Valsartan Other (See Comments)    GFR drop    Patient Measurements: Height: 5\' 2"  (157.5 cm) Weight: 82.5 kg (181 lb 14.1 oz) IBW/kg (Calculated) : 50.1 Heparin Dosing Weight: 68.9 kg  Vital Signs: Temp: 97.5 F (36.4 C) (06/24 0721) Temp Source: Axillary (06/24 0721) BP: 121/60 (06/24 1000) Pulse Rate: 77 (06/24 1000)  Labs: Recent Labs    05/02/20 0046 05/02/20 0513 05/02/20 1048 05/02/20 1048 05/02/20 1540 05/03/20 0437 05/03/20 0909 05/03/20 0909 05/03/20 1922 05/03/20 1922 05/03/20 2229 05/04/20 0146 05/04/20 0146 05/04/20 0148 05/04/20 0759 05/04/20 0923 05/04/20 0939  HGB   < >  --  13.1   < >  --   --  13.0   < > 10.7*   < >  --  9.0*   < >  --   --  10.0* 10.5*  HCT   < >  --  37.2   < >  --   --  38.5   < > 32.8*   < >  --  27.5*  --   --   --  30.9* 31.0*  PLT   < >  --  126*   < >  --   --  120*  PLATELET CLUMPS NOTED ON SMEAR, COUNT APPEARS DECREASED   < > 111*   < >  --  109*  --   --  109* 115*  --   APTT   < > 34  --   --   --   --  34  --   --   --   --  45*  --   --  45*  --   --   LABPROT   < >  --  21.7*  --   --   --  17.8*  --   --   --   --   --   --   --  23.0*  --   --   INR   < >  --  2.0*  --   --   --  1.5*  --   --   --   --   --   --   --  2.1*  --   --   CREATININE   < > 2.21* 1.88*  --    < > 1.43*  --   --  1.71*  --   --   --   --  1.61*  --   --   --   CKTOTAL  --  >50,000*  --   --   --  23,880*  --   --   --   --  9,257*  --   --   --   --   --   --    < > = values in this interval not displayed.    Estimated Creatinine Clearance: 34.7 mL/min (A) (by C-G formula based on SCr of 1.61 mg/dL (H)).   Medical History: Past Medical History:  Diagnosis Date  . Balance disorder   .  Hypertension   . Renal disorder    Chronic kidney disease    Medications:  Infusions:  .  prismasol BGK 4/2.5 400 mL/hr at 05/04/20 0459  .  prismasol BGK 4/2.5 400 mL/hr at 05/04/20 0501  . sodium chloride Stopped (05/02/20 2159)  . calcium gluconate    . ceFEPime (MAXIPIME) IV Stopped (05/03/20 2237)  . feeding supplement (VITAL AF 1.2 CAL) 1,000 mL (05/04/20 0916)  . heparin 1,100 Units/hr (05/04/20 1206)  . HYDROmorphone Stopped (05/03/20 1603)  . phenylephrine (NEO-SYNEPHRINE) Adult infusion 110 mcg/min (05/04/20 1235)  . prismasol BGK 4/2.5 2,000 mL/hr at 05/04/20 1049  . [START ON 05/05/2020] vancomycin    . vasopressin (PITRESSIN) infusion - *FOR SHOCK* 0.03 Units/min (05/04/20 1200)    Assessment: 42 YOF with a new loss of R-radial pulse and dopplers revealing a RUE DVT. Pharmacy consulted to start Heparin for anticoagulation.   The patient has been on Heparin SQ for VTE prophylaxis - last dose this AM, INR 1.5, plts 115. Will give a lower bolus and initiate the drip. Noted AT III low at 49 which might contribute to increased heparin requirements.   Goal of Therapy:  Heparin level 0.3-0.7 units/ml Monitor platelets by anticoagulation protocol: Yes   Plan:  - Heparin 3000 unit bolus x 1 - Start Heparin at 1100 units/hr (11 ml/hr) - Will continue to monitor for any signs/symptoms of bleeding and will follow up with heparin level in 8 hours   Thank you for allowing pharmacy to be a part of this patient's care.  Alycia Rossetti, PharmD, BCPS Clinical Pharmacist Clinical phone for 05/04/2020: [phone] 05/04/2020 11:49 AM   **Pharmacist phone directory can now be found on [email or web address] ([email or web address]).  Listed under [place].

## 2020-05-04 NOTE — Progress Notes (Signed)
Pt transported form 2M05 to CT and back with no complications.

## 2020-05-04 NOTE — Progress Notes (Signed)
Wasted 84 ml Diluadid with Mendel Corning, RN into the stericycle container in medication room.   [name]

## 2020-05-04 NOTE — Progress Notes (Addendum)
eLink Physician-Brief Progress Note Patient Name: [name] DOB: 1956-10-12 MRN: [id number]   Date of Service  05/04/2020  HPI/Events of Note  Blood pressure softer, Lactic acid > 11, check central line s/p insertion.  eICU Interventions  Will give an additional 12.5 gm of 5 % Albumin while awaiting stat H & H results. Central line in good position w/o pneumothorax.        Kerry Kass [name] 05/04/2020, 9:59 PM

## 2020-05-04 NOTE — Progress Notes (Signed)
  Amiodarone Drug - Drug Interaction Consult Note  Recommendations:  Monitor for bradycardia, AV block and myocardial depression while on metoprolol  Monitor QTc while on ondansetron  Amiodarone is metabolized by the cytochrome P450 system and therefore has the potential to cause many drug interactions. Amiodarone has an average plasma half-life of 50 days (range 20 to 100 days).   There is potential for drug interactions to occur several weeks or months after stopping treatment and the onset of drug interactions may be slow after initiating amiodarone.   []  Statins: Increased risk of myopathy. Simvastatin- restrict dose to 20mg  daily. Other statins: counsel patients to report any muscle pain or weakness immediately.  []  Anticoagulants: Amiodarone can increase anticoagulant effect. Consider warfarin dose reduction. Patients should be monitored closely and the dose of anticoagulant altered accordingly, remembering that amiodarone levels take several weeks to stabilize.  []  Antiepileptics: Amiodarone can increase plasma concentration of phenytoin, the dose should be reduced. Note that small changes in phenytoin dose can result in large changes in levels. Monitor patient and counsel on signs of toxicity.  [x]  Beta blockers: increased risk of bradycardia, AV block and myocardial depression. Sotalol - avoid concomitant use.  []   Calcium channel blockers (diltiazem and verapamil): increased risk of bradycardia, AV block and myocardial depression.  []   Cyclosporine: Amiodarone increases levels of cyclosporine. Reduced dose of cyclosporine is recommended.  []  Digoxin dose should be halved when amiodarone is started.  []  Diuretics: increased risk of cardiotoxicity if hypokalemia occurs.  []  Oral hypoglycemic agents (glyburide, glipizide, glimepiride): increased risk of hypoglycemia. Patient's glucose levels should be monitored closely when initiating amiodarone therapy.   [x]  Drugs that prolong  the QT interval:  Torsades de pointes risk may be increased with concurrent use - avoid if possible.  Monitor QTc, also keep magnesium/potassium WNL if concurrent therapy can't be avoided. Marland Kitchen Antibiotics: e.g. fluoroquinolones, erythromycin. . Antiarrhythmics: e.g. quinidine, procainamide, disopyramide, sotalol. . Antipsychotics: e.g. phenothiazines, haloperidol.  . Lithium, tricyclic antidepressants, and methadone  Ondansetron  Gillermina Hu, PharmD, BCPS, [facility] Clinical Pharmacist 05/04/2020 5:00 PM

## 2020-05-04 NOTE — Progress Notes (Signed)
Inpatient Rehab Admissions Coordinator:   Note therapy currently recommending SNF.  Will sign off for CIR at this time; however, if pt progresses to a level where she can tolerate CIR, please feel free to reconsult as appropriate.    Shann Medal, PT, DPT Admissions Coordinator [phone] 05/04/20  9:51 PM

## 2020-05-04 NOTE — Progress Notes (Signed)
[facility] Progress Note Patient Name: [name] DOB: Sep 23, 1956 MRN: [id number]   Date of Service  05/04/2020  HPI/Events of Note  CT results reviewed. No evidence of compartment syndrome.  eICU Interventions  Results reviewed.        Kerry Kass [name] 05/04/2020, 3:49 AM

## 2020-05-04 NOTE — Procedures (Signed)
Central Venous Catheter Insertion Procedure Note [name] [id number] 07/16/1956  Procedure: Insertion of Central Venous Catheter Indications: Assessment of intravascular volume, Drug and/or fluid administration and Frequent blood sampling  Procedure Details Consent: Risks of procedure as well as the alternatives and risks of each were explained to the (patient/caregiver).  Consent for procedure obtained. Time Out: Verified patient identification, verified procedure, site/side was marked, verified correct patient position, special equipment/implants available, medications/allergies/relevent history reviewed, required imaging and test results available.  Performed  Maximum sterile technique was used including antiseptics, cap, gloves, gown, hand hygiene, mask and sheet. Skin prep: Chlorhexidine; local anesthetic administered A antimicrobial bonded/coated triple lumen catheter was placed in the left internal jugular vein using the Seldinger technique.  Evaluation Blood flow good Complications: No apparent complications Patient did tolerate procedure well. Chest X-ray ordered to verify placement.  CXR: pending.  Procedure performed under direct ultrasound guidance for real time vessel cannulation.      [name], [facility] 05/04/2020, 9:15 PM

## 2020-05-04 NOTE — Progress Notes (Addendum)
PCCM interval note  Patient with progressive shock; consistent with distributive shock and suspected septic shock although no septic source identified.  Increasing neo doses, remains on vaso. Developed Afib RVR, and became acutely hypoxic although this hypoxia may be reflective of significant distal vasocontriction and hypoperfusion vs central hypoxia.   Discussed clinical updates with daughter -- CDiff negative. R radial artery occlusion, on heparin gtt. Worsening shock + Afib  + hypoxia   Code status updated to DNR, per conversation this is most consistent with patient's previously expressed wishes.  We also discussed GOC as it pertains to patient's previously expressed wishes. If patient continues to decline or underlying cause is not reversible to acceptable QOL, revisit GOC and consider transition to comfort care -- daughter seems very open to this if we reach that juncture.   Consent obtained for additional central access -- Consent in chart if CVC placement needed tonight. -Check coags CBC now. Heparin paused.  -Starting solucortef    Eliseo Gum MSN, AGACNP-BC [place] [phone] If no answer, [phone] 05/04/2020, 5:27 PM

## 2020-05-04 NOTE — Progress Notes (Signed)
Orthopedic Tech Progress Note Patient Details:  [name] [birth year] [id number] Called in brace. Patient ID: [name], female   DOB: 1956-08-09, 64 y.o.   MRN: [id number]   [name] 05/04/2020, 4:14 PM

## 2020-05-04 NOTE — Evaluation (Addendum)
Occupational Therapy RE-Evaluation Patient Details Name: [name] MRN: [id number] DOB: 04/23/1956 Today's Date: 05/04/2020    History of Present Illness Pt is a 64 y.o. female with PMH of HTN, IBS, adhesive capulitis of L shoulder, stage 3 CKD, hypercholesterolemia, prediabetes, unsteady gait, restless leg syndroem, vertigo presenting to ED post fall OOB with c/o of pain all over and unable to move legs. 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   Clinical Impression   Pt re-evaluated this date due to change in status. 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 During initial evaluation, pt required totalA+2 for bed mobility, she required minimal assistance for feeding/grooming, and totalA for LB dressing. Pt was being weaned off of sedation and neurology was consulted 6/23 for worsening mental status and increased rigidity. MRI negative, EEG negative. Pt squeezed therapist's hand to command after 30seconds x5. Otherwise pt not following commands and not opening eyes. Pt demonstrates increased tone in BUE. Due to decline in current level of function, pt would benefit from acute OT to address established goals to facilitate safe D/C to venue listed below. At this time, recommend SNF follow-up. Will continue to follow acutely.     Follow Up Recommendations  SNF;Supervision/Assistance - 24 hour    Equipment Recommendations   (defer to next venue)    Recommendations for Other Services       Precautions / Restrictions Precautions Precautions: Fall Precaution Comments: fluid filled blisters on LLE, CRRT,  Restrictions Weight Bearing Restrictions: No Other Position/Activity Restrictions: no orders in chart, presume NWB RLE due to amputation      Mobility Bed Mobility Overal bed mobility: Needs Assistance             General bed mobility comments: totalA  Transfers                 General transfer comment: unable    Balance                                            ADL either performed or assessed with clinical judgement   ADL Overall ADL's : Needs assistance/impaired                                       General ADL Comments: pt requires totalA for all aspects of ADL, no active engagement noted during session     Vision Baseline Vision/History: Wears glasses Wears Glasses: At all times Patient Visual Report:  (difficult to thoroughly assess) Vision Assessment?: Yes;Vision impaired- to be further tested in functional context Eye Alignment: Impaired (comment) (dysconjugate gaze;L pupil no dilation Rpupil dilate/contract) Additional Comments: per chart, 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;difficult to assess vision due to level of arousal     Perception     Praxis      Pertinent Vitals/Pain Pain Assessment: Faces Faces Pain Scale: Hurts a little bit Pain Location: grimacing to sternal rub Pain Descriptors / Indicators: Grimacing Pain Intervention(s): Monitored during session;Limited activity within patient's tolerance     Hand Dominance Right   Extremity/Trunk Assessment Upper Extremity Assessment Upper Extremity Assessment: Difficult to assess due to impaired cognition RUE Deficits / Details: increased tone noted, able to reach full elbow extension with PROM;hematoma on bicep;able to progress shoulder abduction to 90*;tight  pectoralis muscles;pt resting with digits in flexion LUE Deficits / Details: able to achieve end range eblow extension/flexion, able to progress shoulder to 90* abduction with ER    Lower Extremity Assessment Lower Extremity Assessment: Defer to PT evaluation RLE Deficits / Details: s/p AKA   Cervical / Trunk Assessment Cervical / Trunk Assessment: Kyphotic   Communication Communication Communication: No difficulties   Cognition Arousal/Alertness: Lethargic Behavior During Therapy: Flat affect Overall Cognitive Status: Difficult to assess                                  General Comments: pt initiated squeezing therapists hand when prompted <50% of the time;squeezed 2x in a row as prompted by therapist   General Comments  noted skin breakdown from BP cuff placement on Rbicep, RN aware applied mepilex;pt with blisters on LLE;    Exercises Exercises: Other exercises Other Exercises Other Exercises: PROM BUE shoulder abduction, ER, wrist elbow and hand ROM    Shoulder Instructions      Home Living Family/patient expects to be discharged to:: Private residence Living Arrangements: Children (daughter and her family) Available Help at Discharge: Family;Available PRN/intermittently Type of Home: Apartment (basement apartment in daughter's home) Home Access: Level entry     Home Layout: One level     Bathroom Shower/Tub: Teacher, early years/pre: Standard     Home Equipment: Environmental consultant - 2 wheels;Bedside commode;Grab bars - tub/shower;Cane - single point (DME was her deceased husband's, no pot for [facility])   Additional Comments: info per chart      Prior Functioning/Environment Level of Independence: Independent  Gait / Transfers Assistance Needed: Walked without device around house ADL's / Homemaking Assistance Needed: drives, cares for grandchildren and her elderly mom, independent in ADL and IADL   Comments: information per chart;pt status 6/19 pt required minA-totalA for ADL, she required totalA+2 for bed mobility, cognition WFL and no communication difficulties        OT Problem List: Decreased strength;Decreased activity tolerance;Impaired balance (sitting and/or standing);Decreased coordination;Decreased knowledge of use of DME or AE;Obesity;Impaired UE functional use;Pain;Decreased cognition;Impaired vision/perception;Decreased range of motion;Impaired tone;Increased edema      OT Treatment/Interventions: Self-care/ADL training;DME and/or AE instruction;Patient/family education;Balance training;Therapeutic  activities;Therapeutic exercise;Splinting;Cognitive remediation/compensation    OT Goals(Current goals can be found in the care plan section) Acute Rehab OT Goals Patient Stated Goal: pt unable to state OT Goal Formulation: With patient Time For Goal Achievement: 05/18/20 Potential to Achieve Goals: Fair ADL Goals Additional ADL Goal #1: Pt will follow simple command within 5 seconds of request in preparation for ADL. Additional ADL Goal #2: Will demonstrate sustained attention for 3-5 minutes during ADL completion. Additional ADL Goal #3: Will tolerate wearing bilateral resting hand splints for 2hours at a time. Additional ADL Goal #4: Pt will tolerate sitting/chair position for 5 minutes with stable vital signs.  OT Frequency: Min 2X/week   Barriers to D/C:            Co-evaluation              AM-PAC OT "6 Clicks" Daily Activity     Outcome Measure Help from another person eating meals?: Total Help from another person taking care of personal grooming?: Total Help from another person toileting, which includes using toliet, bedpan, or urinal?: Total Help from another person bathing (including washing, rinsing, drying)?: Total Help from another person to put on and taking off regular  upper body clothing?: Total Help from another person to put on and taking off regular lower body clothing?: Total 6 Click Score: 6   End of Session Equipment Utilized During Treatment: Oxygen Nurse Communication: Mobility status;Other (comment) (newskin breakdown on Rbicep)  Activity Tolerance: Other (comment) (Pt limited due to level of arousal) Patient left: in bed;with call bell/phone within reach;with bed alarm set;with nursing/sitter in room  OT Visit Diagnosis: Muscle weakness (generalized) (M62.81);Other symptoms and signs involving cognitive function;Other abnormalities of gait and mobility (R26.89) Pain - part of body:  (grimacing to sternal rub)                Time: 6153-7943 OT  Time Calculation (min): 24 min Charges:  OT General Charges $OT Visit: 1 Visit OT Evaluation $OT Re-eval: 1 Re-eval  Jakeim Sedore OTR/L Acute Rehabilitation Services Office: [place] 05/04/2020, 2:41 PM

## 2020-05-04 NOTE — Progress Notes (Addendum)
[place] Progress Note Patient Name: [name] DOB: [birth year] MRN: [id number]   Date of Service  05/04/2020  HPI/Events of Note  Patient is oozing blood over the raw surface of her fasciotomy. Blood pressure is soft.  eICU Interventions  Thrombi-pads  + pressure dressing ordered. Stat H & H. Albumin 5 % 250 ml iv bolus x 1        Saadia Dewitt U Ladana Chavero 05/04/2020, 8:48 PM

## 2020-05-04 NOTE — Progress Notes (Signed)
Pharmacy Antibiotic Note  [name] is a 64 y.o. female admitted on 04/27/2020 who continues on antibiotics while ruling out infection among other reasons for acute illness. Pharmacy has been consulted for Vanc and Cefepime dosing. Acute on chronic renal failure, on CRRT.  The patient has been on CRRT since 6/21 with two periods of significant off-time noted - 5h on 6/21, 4.5h on 6/23. The patient's measured Vancomycin trough this morning is 12 mcg/ml and with the noted off-time yesterday likely corrects to lower at ~10.5 mcg/ml. Will give 1500 mg x 1 today and increase to 1250 mg/24h to start tomorrow.   Plan: - Vancomycin 1500 mg IV x 1 to boost levels then increase to 1250 mg IV every 24 hours starting 6/25 while on CRRT - Continue Cefepime 2 grams q12hrs while on CRRT - ID to see today and decide on switching or stopping antibiotics - Monitor dialysis, renal function, clinical status, C&S and vanc levels as needed  Height: 5\' 2"  (157.5 cm) Weight: 82.5 kg (181 lb 14.1 oz) IBW/kg (Calculated) : 50.1  Temp (24hrs), Avg:96.8 F (36 C), Min:95.7 F (35.4 C), Max:98 F (36.7 C)  Recent Labs  Lab 05/02/20 0046 05/02/20 0513 05/02/20 1048 05/02/20 1540 05/02/20 1721 05/02/20 1935 05/03/20 0437 05/03/20 0909 05/03/20 1225 05/03/20 1922 05/03/20 2229 05/04/20 0130 05/04/20 0146 05/04/20 0148 05/04/20 0759  WBC 19.1*  --  27.1*  --   --   --   --  44.0*  --  47.4*  --   --  47.3*  --   --   CREATININE 2.31*   < > 1.88* 1.69*  --   --  1.43*  --   --  1.71*  --   --   --  1.61*  --   LATICACIDVEN 4.6*   < >  --   --    < > 3.2*  --   --  5.4* 10.0* 9.2* 8.7*  --   --   --   VANCOTROUGH  --   --   --   --   --   --   --   --   --   --   --   --   --   --  12*   < > = values in this interval not displayed.    Estimated Creatinine Clearance: 34.7 mL/min (A) (by C-G formula based on SCr of 1.61 mg/dL (H)).    Allergies  Allergen Reactions  . Olmesartan Other (See Comments)     GFR drop  . Requip [Ropinirole] Other (See Comments)    Overactive bladder  . Valsartan Other (See Comments)    GFR drop    Vanc 6/21 >>  Cefepime 6/21 >>  Clinda 6/21 >6/22  6/21 MRSA PCR: neg 6/18 Bcx: NGTD  Thank you for allowing pharmacy to be a part of this patient's care.  Alycia Rossetti, PharmD, BCPS Clinical Pharmacist Clinical phone for 05/04/2020: [phone] 05/04/2020 8:47 AM   **Pharmacist phone directory can now be found on [email or web address] ([email or web address]).  Listed under [place].

## 2020-05-04 NOTE — Progress Notes (Signed)
Reason for consult:   Subjective: Patient still unresponsive, but does grimace to noxious stimulus and has flexor posturing to sternal rub. Does not follow any commands. Continues to have increasedtone bilateral in bilateral upper extremities, remains afebrile.  Objective: MRI brain performed yesterday negative for acute findings. EEG was also negative. CK continues to trend downwards, blood white count continues to remain elevated in the 47,000 range. C. difficile test being ordered.  ROS: negative except above   Examination  Vital signs in last 24 hours: Temp:  [95.7 F (35.4 C)-98 F (36.7 C)] 97.6 F (36.4 C) (06/24 1131) Pulse Rate:  [50-99] 78 (06/24 1200) Resp:  [18-31] 23 (06/24 1200) BP: (65-150)/(37-113) 113/89 (06/24 1200) SpO2:  [88 %-100 %] 100 % (06/24 1200) FiO2 (%):  [30 %-40 %] 30 % (06/24 1101) Weight:  [82.5 kg] 82.5 kg (06/24 0500)  General: lying in bed CVS: pulse-normal rate and rhythm RS: On ventilator Extremities: normal   Neuro: MS: Grimaces to noxious stimulus, does not follow commands CN: Left pupil sluggish, weak cough reflex Motor: Flexor posturing to sternal rub, increased tone bilateral upper extremities and left lower extremity Reflexes: 2+ bilateral upper extremities and left patella Gait: not tested  Basic Metabolic Panel: Recent Labs  Lab 04/27/2020 0531 04/12/2020 1138 05/02/20 0513 05/02/20 1048 05/02/20 1540 05/02/20 1540 05/03/20 0437 05/03/20 0437 05/03/20 1922 05/04/20 0146 05/04/20 0148 05/04/20 0939 05/04/20 1209  NA 136   < > 135   < > 137   < > 138  --  135  --  138 139 137  K 5.2*   < > 3.7   < > 4.1   < > 4.7  --  5.3*  --  4.7 4.8 5.3*  CL 106   < > 100   < > 104  --  105  --  103  --  107  --  103  CO2 14*   < > 22   < > 21*  --  22  --  16*  --  17*  --  21*  GLUCOSE 100*   < > 251*   < > 111*  --  134*  --  283*  --  105*  --  118*  BUN 60*   < > 31*   < > 24*  --  20  --  22  --  22  --  21  CREATININE 4.23*   <  > 2.21*   < > 1.69*  --  1.43*  --  1.71*  --  1.61*  --  1.35*  CALCIUM 6.8*   < > 6.7*   < > 7.3*   < > 7.8*   < > 7.4*  --  7.9*  --  8.0*  MG 2.4  --  2.3  --   --   --  2.5*  --   --  2.5*  --   --   --   PHOS 9.6*   < > 5.4*  --  4.0  --  3.3  --  5.7*  --  4.0  --   --    < > = values in this interval not displayed.    CBC: Recent Labs  Lab 05/08/2020 1139 05/08/2020 1816 05/02/20 1048 05/02/20 1048 05/03/20 0909 05/03/20 1922 05/04/20 0146 05/04/20 0759 05/04/20 0923 05/04/20 0939  WBC 28.1*   < > 27.1*  --  44.0* 47.4* 47.3*  --  51.0*  --   NEUTROABS  26.1*  --   --   --   --   --   --   --   --   --   HGB 15.1*   < > 13.1   < > 13.0 10.7* 9.0*  --  10.0* 10.5*  HCT 48.6*   < > 37.2   < > 38.5 32.8* 27.5*  --  30.9* 31.0*  MCV 88.0   < > 85.1  --  87.1 91.6 92.0  --  90.9  --   PLT 352   < > 126*   < > 120*  PLATELET CLUMPS NOTED ON SMEAR, COUNT APPEARS DECREASED 111* 109* 109* 115*  --    < > = values in this interval not displayed.     Coagulation Studies: Recent Labs    05/02/20 0046 05/02/20 1048 05/03/20 0909 05/04/20 0759  LABPROT 24.8* 21.7* 17.8* 23.0*  INR 2.3* 2.0* 1.5* 2.1*    Imaging Reviewed:   MRI brain: - no acute abnormality   ASSESSMENT AND PLAN  64 year old female with history of CKD, hypertension, restless leg syndrome, gait imbalance admitted to [facility] on 6/18 after presenting with fall and spending several hours on the floor.  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 fasciotomy to 4 of the left lower extremity  Also had septic shock, acute on chronic renal failure requiring CRRT, hemorrhagic shock. She has been on cefepime. CT head and MRI brain on 6/18 was unremarkable-performed to look for etiology for falls.  Patient was being weaned off sedation and was actually following commands yesterday. Was requiring minimal  sedation with fentanyl, developed increased tone and switched to hydromorphone which was later switched off after patient became more lethargic.  Neurology consulted on 6/23 for worsening mental status and increased rigidity.  Has been afebrile (hypothermic), continues to have worsening leukocytosis in the 40,000's of unclear etiology,  CK however has been trending downwards.  Received Ativan 2 mg twice yesterday, improvement of rigidity.  Stat EEG negative for seizures and MRI brain negative for acute findings.   Metabolic encephalopathy in setting of acute renal failure, cefepime  -Stop hydromorphone -Consider switching cefepime to alternative antibiotic -Continue to hold sedation as much as possible, if sedation required consider Precedex  Rhabdomyolysis vs inflammatory/infectious myelitis vs muscle rigidity from dopamine withdrawal?  -unlikely to be NMS in absence of fever - restart ropinirole at home dose  -Muscle biopsy --Trend CK, monitor temperature  --As needed Ativan for significant rigidity -We will order ANA, ANCA,anti-Ro/SSA, anti-La/SSB, anti-Sm, anti-ribonucleoprotein (RNP) antibodies, MOG ab, aldolase  Coagulopathy- hypercoagulable panel sent by nephrology  Septic shock:  On pressors, C. difficile pending Consider LP-however would be unusual to have bacterial meningitis and patient is on both vancomycin and cefepime and yield would likely be low.   CRITICAL CARE Performed by: Lanice Schwab Meenakshi Sazama   Total critical care time: 45 minutes  Critical care time was exclusive of separately billable procedures and treating other patients.  Critical care was necessary to treat or prevent imminent or life-threatening deterioration.  Critical care was time spent personally by me on the following activities: development of treatment plan with patient and/or surrogate as well as nursing, discussions with consultants, evaluation of patient's response to treatment, examination of  patient, obtaining history from patient or surrogate, ordering and performing treatments and interventions, ordering and review of laboratory studies, ordering and review of radiographic  studies, pulse oximetry and re-evaluation of patient's condition.     Karena Addison Anahit Klumb [facility] Pager Number [phone] For questions after 7pm please refer to AMION to reach the Neurologist on call

## 2020-05-04 NOTE — Progress Notes (Signed)
Ms. Intriago had an episode of sudden hypotension, then went into Afib with RVR. Vaso & neo increased, 1L bolus, 1amp bicarb, 1g Ca+, fentanyl 12.64mcg given.  Amiodarone infusion was initiated.  No pulse ox waveform until BP improved. CRRT stopped, tube feeds stopped and NG tube placed to LIWS.  Vent pressures remained low (peak 9-12), but increased O2 requirements.   Bedside US with sliding lung bilaterally, normal LV squeeze, normal RV and RA dimensions.  Jugular and subclavian veins with marked respiratory variability.  Plan: Additional 1 L LR Stat labs May need additional central access We will restart CRRT once she remains more stable. Stress dose steroids added Zosyn started Daughter updated on change in status. CXR This patient is critically ill with multiple organ system failure which requires frequent high complexity decision making, assessment, support, evaluation, and titration of therapies. This was completed through the application of advanced monitoring technologies and extensive interpretation of multiple databases. During this encounter critical care time was devoted to patient care services described in this note for 40 minutes.  Julian Hy, DO 05/04/20 6:04 PM [facility]

## 2020-05-04 NOTE — Progress Notes (Signed)
CRITICAL VALUE ALERT  Critical Value:  Hgb 5.6  Date & Time Notied:  05/04/20  2250  Provider Notified: elink  Orders Received/Actions taken: awaiting orders

## 2020-05-04 NOTE — Progress Notes (Addendum)
Progress Note    05/04/2020 12:44 PM 4 Days Post-Op  Subjective:  Asked to evaluate RUE due to loss of Doppler radial pulse.  Patient had right brachial arterial line as well as peripheral IV in RUE.  These have been removed.  Patient remains critically ill. Sedated on vent/CRRT. Requring phenylephrine and vasopressin Bair hugger in place. Withdraws from pain. Will open eyes to name.  Vitals:   05/04/20 1131 05/04/20 1200  BP:  113/89  Pulse:  78  Resp:  (!) 23  Temp: 97.6 F (36.4 C)   SpO2:  100%    Physical Exam: Cardiac:  RRR Lungs:  Mechanical ventilation Incisions:  Right AKA incision well approximated Extremities:   RUE: Hand warm, no cyanosis. Extensive ecchymosis of distal upper arm. Soft/not tense. + brachial artery Doppler signal. Unable to obtain radial or ulnar signals RLE: staple line intact without bleeding. Minimal SS drainage.  Ischemic changes of posterior flap present LLE: Dressings removed. Muscle appears viable. Foot warm. + DP and peroneal Doppler pulses. No edema.  RUE   LLE/lateral   LLE/medial   RLE    CBC    Component Value Date/Time   WBC 51.0 (HH) 05/04/2020 0923   RBC 3.40 (L) 05/04/2020 0923   HGB 10.5 (L) 05/04/2020 0939   HCT 31.0 (L) 05/04/2020 0939   PLT 115 (L) 05/04/2020 0923   MCV 90.9 05/04/2020 0923   MCH 29.4 05/04/2020 0923   MCHC 32.4 05/04/2020 0923   RDW 16.7 (H) 05/04/2020 0923   LYMPHSABS 0.3 (L) 05/03/2020 1139   MONOABS 1.7 (H) 04/15/2020 1139   EOSABS 0.0 04/24/2020 1139   BASOSABS 0.0 04/21/2020 1139    BMET    Component Value Date/Time   NA 139 05/04/2020 0939   K 4.8 05/04/2020 0939   CL 107 05/04/2020 0148   CO2 17 (L) 05/04/2020 0148   GLUCOSE 105 (H) 05/04/2020 0148   BUN 22 05/04/2020 0148   CREATININE 1.61 (H) 05/04/2020 0148   CALCIUM 7.9 (L) 05/04/2020 0148   GFRNONAA 33 (L) 05/04/2020 0148   GFRAA 38 (L) 05/04/2020 0148     Intake/Output Summary (Last 24 hours) at 05/04/2020  1244 Last data filed at 05/04/2020 1200 Gross per 24 hour  Intake 2626.61 ml  Output 2330 ml  Net 296.61 ml    HOSPITAL MEDICATIONS Scheduled Meds: . artificial tears   Right Eye Q8H  . chlorhexidine gluconate (MEDLINE KIT)  15 mL Mouth Rinse BID  . Chlorhexidine Gluconate Cloth  6 each Topical Daily  . feeding supplement (PRO-STAT SUGAR FREE 64)  30 mL Per Tube BID  . insulin aspart  0-15 Units Subcutaneous Q4H  . mouth rinse  15 mL Mouth Rinse 10 times per day  . pantoprazole sodium  40 mg Per Tube Daily  . rOPINIRole  1 mg Per Tube Daily  . sodium chloride flush  10-40 mL Intracatheter Q12H   Continuous Infusions: .  prismasol BGK 4/2.5 400 mL/hr at 05/04/20 0459  .  prismasol BGK 4/2.5 400 mL/hr at 05/04/20 0501  . sodium chloride Stopped (05/02/20 2159)  . calcium gluconate    . ceFEPime (MAXIPIME) IV Stopped (05/03/20 2237)  . feeding supplement (VITAL AF 1.2 CAL) 1,000 mL (05/04/20 0916)  . heparin 1,100 Units/hr (05/04/20 1206)  . HYDROmorphone Stopped (05/03/20 1603)  . phenylephrine (NEO-SYNEPHRINE) Adult infusion 110 mcg/min (05/04/20 1235)  . prismasol BGK 4/2.5 2,000 mL/hr at 05/04/20 1049  . [START ON 05/05/2020] vancomycin    .  vasopressin (PITRESSIN) infusion - *FOR SHOCK* 0.03 Units/min (05/04/20 1200)   PRN Meds:.sodium chloride, acetaminophen **OR** acetaminophen, alteplase, docusate sodium, heparin, HYDROmorphone, lip balm, metoprolol tartrate, ondansetron, polyethylene glycol, sodium chloride, sodium chloride flush    Right Doppler Findings:  +---------------+----------+---------+--------+--------------+  Site      PSV (cm/s)Waveform StenosisComments     +---------------+----------+---------+--------+--------------+  Subclavian Prox106    biphasic              +---------------+----------+---------+--------+--------------+  Subclavian Mid -123   triphasic              +---------------+----------+---------+--------+--------------+  Axillary    -0.76   biphasic              +---------------+----------+---------+--------+--------------+  Brachial Prox 83    biphasic              +---------------+----------+---------+--------+--------------+  Brachial Dist -40             near occlusion  +---------------+----------+---------+--------+--------------+  Radial Prox            occluded         +---------------+----------+---------+--------+--------------+  Radial Mid             occluded         +---------------+----------+---------+--------+--------------+  Radial Dist            occluded         +---------------+----------+---------+--------+--------------+  Ulnar Prox   77                      +---------------+----------+---------+--------+--------------+  Ulnar Mid   -55                      +---------------+----------+---------+--------+--------------+      Summary:    Right: Obstruction noted in the radial artery.   RUE venous U/S: no evidence of DVT  CT abd/pelvis: No evidence of bowel ischemia.  CT RLE and LLE: no evidence of thigh compartment syndrome  Assessment:  64 y.o.femaleis s/pright lower extremity 4 compartment fasciotomy and thromboembolectomy where no clot was returned. She ultimately required right above-knee amputation for worsening CKs and hypertension. She was later noted to have left lower extremity compartment syndrome and underwent 4 compartment fasciotomies on the left.  Acidosis/sepsis. Metabolic encephalopathy/respiratory failure. WBCs up to 51k. Remains on vent support and CRRT. Fio2: 30%  Radial artery occlusion, acute with ? Underlying vascular disease on vasopressors. Now on heparin infusion.  Hand not acutely ischemic  appearing.    Worsening ischemic changes of posterior flap of right AKA   Plan: -will review case with Dr. Donzetta Matters. Continue heparin infusion supportive care per CCM  -DVT prophylaxis:  Heparin infusion   Risa Grill, PA-C [facility] [phone] 05/04/2020  12:44 PM   I have independently interviewed and examined patient and agree with PA assessment and plan above.  And does not appear grossly ischemic although difficult to discern signals does have flow by duplex in the ulnar artery.  Appears to have malperfusion of the posterior flap of right AKA left lower extremity fasciotomy sites appear clean and healthy.  Nothing from vascular standpoint appears to account for leukocytosis or worsening clinical picture.  Gay Rape C. Donzetta Matters, MD [facility] [place] Office: [phone] Pager: [phone]

## 2020-05-04 NOTE — Evaluation (Signed)
Physical Therapy Evaluation Patient Details Name: [name] MRN: [id number] DOB: Jun 05, 1956 Today's Date: 05/04/2020   History of Present Illness  Pt is a 64 y.o. female with PMH of HTN, IBS, adhesive capulitis of L shoulder, stage 3 CKD, hypercholesterolemia, prediabetes, unsteady gait, restless leg syndroem, vertigo presenting to ED post fall OOB with c/o of pain all over and unable to move legs. Pt is s/p R AKA amd LLE fasciotomy on 04/26/2020.  Clinical Impression  Pt now s/p R AKA and L LE fasciotomy.  Pt's arousal level is low with minimal response initially except to pain.  Later in the session pt showed ability to squeeze her hand in response to simple commands.  Pt will need significant assist of 2 persons to begin mobility.  Pt currently limited functionally due to the problems listed. ( See problems list.)   Pt will benefit from PT to maximize function and safety in order to get ready for next venue listed below.     Follow Up Recommendations SNF;Supervision/Assistance - 24 hour    Equipment Recommendations   (TBA)    Recommendations for Other Services       Precautions / Restrictions Precautions Precautions: Fall Precaution Comments: fluid filled blisters on LLE, CRRT,  Restrictions Weight Bearing Restrictions: No Other Position/Activity Restrictions: no orders in chart, presume NWB RLE due to amputation      Mobility  Bed Mobility Overal bed mobility: Needs Assistance             General bed mobility comments: totalA  Transfers                 General transfer comment: unable  Ambulation/Gait                Stairs            Wheelchair Mobility    Modified Rankin (Stroke Patients Only)       Balance                                             Pertinent Vitals/Pain Pain Assessment: Faces Faces Pain Scale: Hurts a little bit Pain Location: grimacing to sternal rub Pain Descriptors / Indicators:  Grimacing Pain Intervention(s): Monitored during session    Home Living Family/patient expects to be discharged to:: Private residence Living Arrangements: Children (daughter and her family) Available Help at Discharge: Family;Available PRN/intermittently Type of Home: Apartment (basement apartment in daughter's home) Home Access: Level entry     Home Layout: One level Home Equipment: Walker - 2 wheels;Bedside commode;Grab bars - tub/shower;Cane - single point (DME was her deceased husband's, no pot for [facility]) Additional Comments: info per chart    Prior Function Level of Independence: Independent   Gait / Transfers Assistance Needed: Walked without device around house  ADL's / Homemaking Assistance Needed: drives, cares for grandchildren and her elderly mom, independent in ADL and IADL  Comments: information per chart;pt status 6/19 pt required minA-totalA for ADL, she required totalA+2 for bed mobility, cognition WFL and no communication difficulties     Hand Dominance   Dominant Hand: Right    Extremity/Trunk Assessment   Upper Extremity Assessment Upper Extremity Assessment: Defer to OT evaluation RUE Deficits / Details: increased tone noted, able to reach full elbow extension with PROM;hematoma on bicep;able to progress shoulder abduction to 90*;tight pectoralis muscles;pt resting with digits in  flexion LUE Deficits / Details: able to achieve end range eblow extension/flexion, able to progress shoulder to 90* abduction with ER     Lower Extremity Assessment Lower Extremity Assessment: Difficult to assess due to impaired cognition RLE Deficits / Details: s/p AKA    Cervical / Trunk Assessment Cervical / Trunk Assessment: Kyphotic  Communication   Communication: No difficulties  Cognition Arousal/Alertness: Lethargic Behavior During Therapy: Flat affect Overall Cognitive Status: Difficult to assess                                 General Comments: pt  initiated squeezing therapists hand when prompted <50% of the time;squeezed 2x in a row as prompted by therapist      General Comments General comments (skin integrity, edema, etc.): noted skin breakdown from BP cuff placement on Rbicep, RN aware applied mepilex;pt with blisters on LLE;    Exercises Other Exercises Other Exercises: PROM BUE shoulder abduction, ER, wrist elbow and hand ROM  Other Exercises: PROM to bil LE   Assessment/Plan    PT Assessment Patient needs continued PT services  PT Problem List Decreased strength;Decreased activity tolerance;Decreased mobility;Decreased coordination;Decreased knowledge of use of DME;Decreased safety awareness;Decreased knowledge of precautions       PT Treatment Interventions DME instruction;Gait training;Functional mobility training;Therapeutic activities;Therapeutic exercise;Balance training;Patient/family education;Cognitive remediation    PT Goals (Current goals can be found in the Care Plan section)  Acute Rehab PT Goals Patient Stated Goal: pt unable to state PT Goal Formulation: With patient Time For Goal Achievement: 05/18/20 Potential to Achieve Goals: Fair    Frequency Min 3X/week   Barriers to discharge        Co-evaluation               AM-PAC PT "6 Clicks" Mobility  Outcome Measure Help needed turning from your back to your side while in a flat bed without using bedrails?: Total Help needed moving from lying on your back to sitting on the side of a flat bed without using bedrails?: Total Help needed moving to and from a bed to a chair (including a wheelchair)?: Total Help needed standing up from a chair using your arms (e.g., wheelchair or bedside chair)?: Total Help needed to walk in hospital room?: Total Help needed climbing 3-5 steps with a railing? : Total 6 Click Score: 6    End of Session   Activity Tolerance: Other (comment) (pt minimally responsive through most of session) Patient left: in  bed;with call bell/phone within reach;with SCD's reapplied Nurse Communication: Mobility status;Need for lift equipment PT Visit Diagnosis: Other abnormalities of gait and mobility (R26.89);Muscle weakness (generalized) (M62.81);Adult, failure to thrive (R62.7)    Time: 7902-4097 PT Time Calculation (min) (ACUTE ONLY): 24 min   Charges:   PT Evaluation $PT Re-evaluation: 1 Re-eval          05/04/2020  [name], PT Acute Rehabilitation Services [phone]  (pager) [phone]  (office)  [name] Chrystine Frogge 05/04/2020, 4:25 PM

## 2020-05-04 NOTE — Progress Notes (Signed)
[place] Progress Note Patient Name: MIKIA DELALUZ DOB: 10-03-1956 MRN: [id number]   Date of Service  05/04/2020  HPI/Events of Note  Patient is bleeding from her fasciotomy site, with acute blood loss anemia (hemoglobin 5.6 gm %)  eICU Interventions  Transfuse 3 units PRBC stat.        Kerry Kass Thayer Embleton 05/04/2020, 11:00 PM

## 2020-05-04 NOTE — Progress Notes (Signed)
[facility] Progress Note   Assessment/ Plan:   1. AoCKD 3- b/l creat 1.3 in March 2021, eGFR 46. Found down prior to admission and presented w/ AKI , oliguric w/ CPK >50,000 indicating significant rhabdomyolysis. Renal US w/o hydro. On CRRT, started 04/15/2020 in setting of postop K of 7.3, resp distress, intubation.  No heparin.   Continue all 4K fluids, net neg 50 mL/ hr for now.  No signs of renal recovery.  2. Hypertonicity/ rigidity: see discussion below under subjective.  Resolved with Ativan, also gave  Ca gluc after.  MAR reviewed- no obvious culprit meds (no antimuscarinics, phenergan, SSRIs/ SNRIs), clinical picture not consistent with NMS/ serotonin syndrome.  Ativan given with resolution of symptoms.  Ca low but corrects with albumin.  Received many citrated blood products, has rhabdo--> will give more Ca gluconate, would also check iCal too.  Think nonconvulsive status less likely.   3. 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  Appreciate VVS. ? If she needs a hypercoag workup--> I will send protein C/S, Factor V Leiden, AT III, LAC panel.  She is getting Doffing heparin for DVT prophylaxis.  R arm ABIs/ venous duplex pending as can't doppler radial pulse this AM    4. Coagulopathy: DIC panel in process--> not consistent with TTP  5. Acute blood loss anemia: received multiple units pRBCs in OR  6. Hyperkalemia -resolved  7. Shock: hemorrhagic +/- septic--> on pressor per PCCM, ID consulted, initially on vanc/ clinda/ cefepime, now de-escalated to vanc/ cefepime.  WBC ct up to 47 this AM, C diff pending, d/w PCCM for potential LP/ neuro eval  8. Acute hypoxic RF: intubated, per PCCM  9. Metabolic acidosis: transiently on bicarb gtt, better now.  10. Dispo: in  ICU  Subjective:    Lactate up to 10 yesterday, WBC up to 47 today.  Abd [place] negative, CT abd without evidence of ischemic bowel but did show GB debris not visualized on ultrasound.  MRI neg for acute stroke.  Having liquid stool, Cdiff pending.  R brachial A line out.  CT L lower extremity without abscess/ thigh compartment syndrome.  CXR clear.  Pan- cultures pending   Objective:   BP (!) 92/59 (BP Location: Right Arm)   Pulse 81   Temp (!) 97.5 F (36.4 C) (Axillary)   Resp (!) 21   Ht 5' 2"  (1.575 m)   Wt 82.5 kg   SpO2 100%   BMI 33.27 kg/m   Physical Exam: Gen: intubated, NAD CVS: RRR Resp: mech bilaterally Abd: soft Ext: s/p R AKA, dressed, s/p L fascitomies wrapped.  + hypertonicity of bilateral upper extremities with increased flexion of L foot.  Cool and mottled bilateral hands. ACCESS: R IJ nontunneled HD cath  Labs: BMET Recent Labs  Lab 04/27/2020 0531 04/15/2020 0531 04/26/2020 1138 05/10/2020 1138 05/02/20 0046 05/02/20 0513 05/02/20 1048 05/02/20 1540 05/03/20 0437 05/03/20 1922 05/04/20 0148  NA 136   < > 135   < > 135 135 136 137 138 135 138  K 5.2*   < > 4.7   < > 3.9 3.7 3.9 4.1 4.7 5.3* 4.7  CL 106   < > 105   < > 100 100 101 104 105 103 107  CO2 14*   < > 12*   < > 20* 22  21* 21* 22 16* 17*  GLUCOSE 100*   < > 91   < > 309* 251* 116* 111* 134* 283* 105*  BUN 60*   < > 42*   < > 35* 31* 26* 24* 20 22 22   CREATININE 4.23*   < > 3.13*   < > 2.31* 2.21* 1.88* 1.69* 1.43* 1.71* 1.61*  CALCIUM 6.8*   < > 6.7*   < > 6.7* 6.7* 7.0* 7.3* 7.8* 7.4* 7.9*  PHOS 9.6*  --  8.9*  --   --  5.4*  --  4.0 3.3 5.7* 4.0   < > = values in this interval not displayed.   CBC Recent Labs  Lab 04/29/2020 1139 04/17/2020 1816 05/02/20 1048 05/03/20 0909 05/03/20 1922 05/04/20 0146  WBC 28.1*   < > 27.1* 44.0* 47.4* 47.3*  NEUTROABS 26.1*  --   --   --   --   --   HGB 15.1*   < > 13.1 13.0 10.7* 9.0*  HCT 48.6*   < > 37.2 38.5 32.8* 27.5*  MCV 88.0   < > 85.1 87.1 91.6  92.0  PLT 352   < > 126* 120*  PLATELET CLUMPS NOTED ON SMEAR, COUNT APPEARS DECREASED 111* 109*   < > = values in this interval not displayed.      Medications:    . artificial tears   Right Eye Q8H  . chlorhexidine gluconate (MEDLINE KIT)  15 mL Mouth Rinse BID  . Chlorhexidine Gluconate Cloth  6 each Topical Daily  . feeding supplement (PRO-STAT SUGAR FREE 64)  30 mL Per Tube BID  . heparin  5,000 Units Subcutaneous Q8H  . insulin aspart  0-15 Units Subcutaneous Q4H  . insulin aspart  2 Units Subcutaneous Q4H  . mouth rinse  15 mL Mouth Rinse 10 times per day  . pantoprazole sodium  40 mg Per Tube Daily  . rOPINIRole  1 mg Per Tube Daily  . sodium chloride flush  10-40 mL Intracatheter Q12H     Madelon Lips MD 05/04/2020, 8:34 AM

## 2020-05-04 NOTE — Progress Notes (Addendum)
NAME:  [name], MRN:  [id number], DOB:  06-15-1956, LOS: 6 ADMISSION DATE:  05/05/2020, CONSULTATION DATE:  05/04/20 REFERRING MD:  Anesthesia, CHIEF COMPLAINT:  Respiratory failure   Brief History   64 y.o. F who fell on 6/18 and developed Rhabdo and worsening RLE pain, taken to the OR on 6/20 for thromboembolectomy and fasciotomy, was extubated and then had to be re-intubated and found to be profoundly anemic.  PCCM consulted for transfer to ICU.   History of present illness   64 y.o. F with PMH HTN, CKD and RLS who was initially admitted 6/18 after rolling out bed and being down on the floor for several hours. She was treated for rhabdo with IVF and admitted, later developed mottling of the RLE and taken to the OR 6/20 for thromboembolectomy and compartment fasciotomies. Post-op was extubated and awake, then developed worsening respiratory status and was re-intubated. Labs returned with Hgb 4.2, K 7.3, pH 7.0 and pt requiring Neo gtt.    Per report, EBL 1L intraoperatively.  Patient was given 2 A bicarb, calcium, insulin, 4 units PRBC ordered and transferred to the intensive care unit for the initiation of CRRT.  Past Medical History   has a past medical history of Balance disorder, Hypertension, and Renal disorder.  Significant Hospital Events   6/18 Admit to internal medicine 6/20 > RLE thromboembolectomy and fasciotomy, transfer to ICU 6/21>RLE AKA, LLE fasciotomy x 4   Consults:  Vascular surgery PCCM Nephrology neurology  Procedures:  ETT 6/20 6/20 > RLE thromboembolectomy and fasciotomy, transfer to ICU 6/21>RLE AKA, LLE fasciotomy x 4   Significant Diagnostic Tests:  6/18 CT head>> no acute findings 6/18 MRI brain>>unremarkable 6/23 RUQ US> no gallstones or biliary dilatation. Possible hepatic steatosis  6/23 MRI brain>> normal for age 45/23 EEG>> diffuse encephalopathy, no evidence of seizure  6/24 CT a/p> no evidence of bowel ischemia. Fluid filled large and  small bowel without transition point, ileus pattern. No distention or wall thickening, no inflammatory change.   Possible solid lesion from lower R kidney 46mm, indeterminate.  6/24 CT BLE> no evidence of compartment syndrome   Micro Data:  6/18 blood cultures x2>> 6/18 SARS-Covid-2>> negative  Antimicrobials:  Ancef 6/20 Cefepime 6/21> vanc 6/21> clinda 6/21>6/22  Interim history/subjective:  In interval has had increase in LA to 10, with subsequent mild downtrend to 8.7, as well as progressive WBC to 47. Plt have been mildly downtrending (yesterday DIC workup was not revealing)   BUE with increased muscle tone; EEG did not reveal seizures and MRI brain did not reveal acute findings.   Continues on MV, CRRT, vaso and neo.  Objective   Blood pressure (!) 103/59, pulse 81, temperature (!) 97.5 F (36.4 C), temperature source Axillary, resp. rate (!) 23, height 5\' 2"  (1.575 m), weight 82.5 kg, SpO2 100 %.    Vent Mode: PRVC FiO2 (%):  [30 %-40 %] 40 % Set Rate:  [26 bmp] 26 bmp Vt Set:  [400 mL] 400 mL PEEP:  [5 cmH20] 5 cmH20 Plateau Pressure:  [15 cmH20-16 cmH20] 16 cmH20   Intake/Output Summary (Last 24 hours) at 05/04/2020 0725 Last data filed at 05/04/2020 0700 Gross per 24 hour  Intake 2675.83 ml  Output 2390 ml  Net 285.83 ml   Filed Weights   05/02/20 0500 05/03/20 0500 05/04/20 0500  Weight: 85.6 kg 82.9 kg 82.5 kg   General: Critically ill appearing adult F Neuro: Grimaces to painful stimuli. Moving R hand and  L toes spontaneously. Does not follow commands. L pupil brisk.  HEENT: NCAT. Pink mmm. ETT OGT secure. 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  PULM: Symmetrical chest expansion, CTA bilaterally. No accessory muscle use on PSV/CPAP GI: Soft round + bowel sounds. Mild diffuse tenderness  GU: WNL  Extremities: BUE increased muscle tone. Edematous BUE. Soft compartments BUE. RLE AKA  dressing c/d/i. LLE fasciotomy dressings c/d/i.  Skin: RUE ecchymosis at brachial art line removal site. Scattered ecchymosis BUE, L breast. Bilateral thighs with mix of open and closed blisters, dressed in xeroform.    Resolved Hospital Problem list   Compartment syndrome BLE Acute limb ischemia BLE Hyperphosphatemia hyperkalemia  Assessment & Plan:   Acute metabolic encephalopathy, worsening -in setting of worsening acidosis, sepsis unknown source, renal failure  -MRI brain wnl -EEG without seizure -LFTs down trending  P -Minimize sedation; currently off continuous -correct metabolic abnormalites as able  -checking VBG to r/o hypercarbia -checking ammonia to r/o HE  -discussed with neurology at bedside -- rec to consider changing off cefepime. Defer pursuit of LP at this time   Increased muscle tone BUE, some interval improvement  -hx RLS previously on requip, changed to gabapentin outpt. -transient improvement with BZD  -received cal glu in setting of hx 6PRBC  -EEG without seizure  -MRI brain ok  -afebrile, not hyperreflexive -- clinically doesn't fit NMS, SS, spectrum of adverse anesthesia reactions P -neuro consulted, discussed at bedside 6/24 -requip resumed at lower dose  -PRN bzd for significant rigidity   Acute hypoxic respiratory failure  due to severe acidosis, encephalopathy requiring MV. Reintubated after respiratory decompemsation in PACU.  P -WUA/SBT, mentation precluding extubation  -Cont MV support. Goal Pplat < 30, driving pressure < 15 -when progressed to extubation readiness, ensure cuff leak (multiple intubations) -VAP, pulm hygiene  Shock -hemorrhagic shock component largely improved -ongoing shock possibly sepsis however no clear source at this time -echo without LV  Or RV dysfunction P  -Appreciate ID following, abx recs. Currently vanc/cefepime. Will discuss possibly changing off cefepime for possibly cefepime neurotox  -MAP goal > 65,  continue pressors for goal.  -Will attempt change from neo to NE with worsening distal ischemia  -CDiff given ongoing diarrhea, worsening leukocytosis   Lactic acidosis Worsening leukocytosis  -CT a/p without evidence of colitis, enteritis, ischemia  -CT BLE without evidence of compartment syndrome  P -sending CDiff  -checking for B1 deficiency which can be associated with LA   ABLA, improved Hemorrhagic shock, improved -in setting of LLE fasciotomies, RLE thrombectomy, RLE AKA  Received 6 units pRBCs this admission P -Trend CBC -Transfuse if Hgb < 7  Compartment syndrome BLE due to rhabdo, s/p AKA on R after unsuccessful fasciotomy on R, fasciotomy on L Loss of dopplerable R radial pulse P -appreciate vascular surgery's assistance -checking Upper extremity arterial and venous duplex -discussed with VVS -- if US findings prompt consideration of systemic anticoag, VVS is ok with heparin gtt.  Acute on chronic renal failure  Rhabdomyolysis  -Ck continues to downtrend  -degree of CK elevation seems somewhat out of proportion to downtime 3-4 hrs P -CRRT per nephrology  -Strict I/Os, foley  -cont to monitor electrolytes  -discussed with neurology -- wonder if underlying myositis. Would need muscle bx to work up  -discussed with nephrology -- sending hypercoag work up   Transaminitis, improving  -shock liver  Coagulopathy Thrombocytopenia, mild although interval  worsening  P -recheck DIC panel    Skin bullae, likely due to compartment syndrome P -Appreciate ID following -cont abx -cont to follow culture data -xeroform to bullae   Hyperglycemia with intermittent hypoglycemia P -dc lantus -cont SSI   Possible ileus P -will decrease EN    Best practice:  Diet: EN, decreasing rate in setting of possible ileus  Pain/Anxiety/Delirium protocol (if indicated): Fentanyl/propofol VAP protocol (if indicated): HOB 30 degrees, suction as needed DVT prophylaxis: Heparin   GI prophylaxis: Protonix Glucose control: SSI, basal bolus insulin Mobility: Bedrest Code Status: Full code Family Communication: pending 6/23 Disposition: ICU  Labs   CBC: Recent Labs  Lab 05/07/2020 1139 04/27/2020 1816 05/02/20 0046 05/02/20 1048 05/03/20 0909 05/03/20 1922 05/04/20 0146  WBC 28.1*   < > 19.1* 27.1* 44.0* 47.4* 47.3*  NEUTROABS 26.1*  --   --   --   --   --   --   HGB 15.1*   < > 12.9 13.1 13.0 10.7* 9.0*  HCT 48.6*   < > 37.5 37.2 38.5 32.8* 27.5*  MCV 88.0   < > 84.8 85.1 87.1 91.6 92.0  PLT 352   < > 127* 126* 120*  PLATELET CLUMPS NOTED ON SMEAR, COUNT APPEARS DECREASED 111* 109*   < > = values in this interval not displayed.    Basic Metabolic Panel: Recent Labs  Lab 04/16/2020 0531 05/04/2020 1138 05/02/20 0513 05/02/20 0513 05/02/20 1048 05/02/20 1540 05/03/20 0437 05/03/20 1922 05/04/20 0146 05/04/20 0148  NA 136   < > 135   < > 136 137 138 135  --  138  K 5.2*   < > 3.7   < > 3.9 4.1 4.7 5.3*  --  4.7  CL 106   < > 100   < > 101 104 105 103  --  107  CO2 14*   < > 22   < > 21* 21* 22 16*  --  17*  GLUCOSE 100*   < > 251*   < > 116* 111* 134* 283*  --  105*  BUN 60*   < > 31*   < > 26* 24* 20 22  --  22  CREATININE 4.23*   < > 2.21*   < > 1.88* 1.69* 1.43* 1.71*  --  1.61*  CALCIUM 6.8*   < > 6.7*   < > 7.0* 7.3* 7.8* 7.4*  --  7.9*  MG 2.4  --  2.3  --   --   --  2.5*  --  2.5*  --   PHOS 9.6*   < > 5.4*  --   --  4.0 3.3 5.7*  --  4.0   < > = values in this interval not displayed.   GFR: Estimated Creatinine Clearance: 34.7 mL/min (A) (by C-G formula based on SCr of 1.61 mg/dL (H)). Recent Labs  Lab 05/02/20 1048 05/02/20 1721 05/02/20 1935 05/03/20 0909 05/03/20 1225 05/03/20 1922 05/03/20 2229 05/04/20 0130 05/04/20 0146  WBC 27.1*  --   --  44.0*  --  47.4*  --   --  47.3*  LATICACIDVEN  --    < >   < >  --  5.4* 10.0* 9.2* 8.7*  --    < > = values in this interval not displayed.    Liver Function Tests: Recent Labs   Lab 04/29/20 0215 04/12/2020 0531 04/11/2020 1138 04/12/2020 1138 05/02/20 0513 05/02/20 0513 05/02/20 1540 05/03/20 0437 05/03/20  1922 05/04/20 0146 05/04/20 0148  AST 2,253*  --  2,016*  --  7,064*  --   --  3,512*  --  1,856*  --   ALT 977*  --  1,723*  --  3,896*  --   --  2,109*  --  1,210*  --   ALKPHOS 57  --  64  --  110  --   --  159*  --  148*  --   BILITOT 1.4*  --  2.9*  --  2.8*  --   --  2.9*  --  3.1*  --   PROT 5.4*  --  4.0*  --  3.8*  --   --  4.6*  --  4.5*  --   ALBUMIN 2.4*   < > 2.2*   < > 2.5*  2.4*   < > 2.6* 2.5*  2.5* 1.6* 3.2* 3.2*   < > = values in this interval not displayed.   Recent Labs  Lab 04/29/2020 1139  LIPASE 29   Recent Labs  Lab 05/03/2020 1636  AMMONIA 22    ABG    Component Value Date/Time   PHART 7.231 (L) 04/14/2020 0000   PCO2ART 30.8 (L) 04/16/2020 0000   PO2ART 282 (H) 04/29/2020 0000   HCO3 13.1 (L) 04/28/2020 0000   TCO2 14 (L) 04/17/2020 0000   ACIDBASEDEF 13.0 (H) 05/08/2020 0000   O2SAT 100.0 04/22/2020 0000     Coagulation Profile: Recent Labs  Lab 04/25/2020 1754 05/02/20 0046 05/02/20 1048 05/03/20 0909  INR 1.1 2.3* 2.0* 1.5*    Cardiac Enzymes: Recent Labs  Lab 04/23/2020 0233 04/26/2020 0531 05/02/20 0513 05/03/20 0437 05/03/20 2229  CKTOTAL >50,000* >50,000* >50,000* 23,880* 9,257*    HbA1C: Hgb A1c MFr Bld  Date/Time Value Ref Range Status  04/29/2020 02:15 AM 6.3 (H) 4.8 - 5.6 % Final    Comment:    (NOTE) Pre diabetes:          5.7%-6.4%  Diabetes:              >6.4%  Glycemic control for   <7.0% adults with diabetes     CBG: Recent Labs  Lab 05/03/20 2304 05/03/20 2351 05/04/20 0019 05/04/20 0301 05/04/20 0719  GLUCAP 74 64* 151* 116* 68*    CRITICAL CARE CRITICAL CARE Performed by: Cristal Generous   Total critical care time: 70 minutes  Critical care time was exclusive of separately billable procedures and treating other patients. Critical care was necessary to treat or  prevent imminent or life-threatening deterioration.  Critical care was time spent personally by me on the following activities: development of treatment plan with patient and/or surrogate as well as nursing, discussions with consultants, evaluation of patient's response to treatment, examination of patient, obtaining history from patient or surrogate, ordering and performing treatments and interventions, ordering and review of laboratory studies, ordering and review of radiographic studies, pulse oximetry and re-evaluation of patient's condition.   Eliseo Gum MSN, AGACNP-BC [place] [phone] If no answer, [phone] 05/04/2020, 9:04 AM

## 2020-05-04 NOTE — Progress Notes (Signed)
RUE venous and arterial duplex       has been completed. Preliminary results can be found under CV proc through chart review. June Leap, BS, RDMS, RVT

## 2020-05-04 NOTE — Progress Notes (Signed)
Patient ID: [name], female   DOB: [birth year], 64 y.o.   MRN: [id number]         [facility]  Date of Admission:  04/25/2020   Total days of antibiotics 5        Day 4 vancomycin        Day 4 cefepime         ASSESSMENT: We still have no unifying diagnosis for her rhabdomyolysis and diffuse rigidity.  I doubt that she has meningoencephalitis.  She may have superimposed C. difficile colitis.  PLAN: 1. Continue vancomycin  2. Discontinue cefepime 3. C. difficile screen  Principal Problem:   Blisters of multiple sites Active Problems:   Compartment syndrome of right lower extremity (HCC)   Rhabdomyolysis   Respiratory failure (HCC)   Acute renal failure superimposed on stage 3 chronic kidney disease (HCC)   AMS (altered mental status)   Hemorrhagic shock (HCC)   Scheduled Meds: . artificial tears   Right Eye Q8H  . chlorhexidine gluconate (MEDLINE KIT)  15 mL Mouth Rinse BID  . Chlorhexidine Gluconate Cloth  6 each Topical Daily  . feeding supplement (PRO-STAT SUGAR FREE 64)  30 mL Per Tube BID  . insulin aspart  0-15 Units Subcutaneous Q4H  . mouth rinse  15 mL Mouth Rinse 10 times per day  . pantoprazole sodium  40 mg Per Tube Daily  . [START ON 05/05/2020] rOPINIRole  2 mg Per Tube Daily  . sodium chloride flush  10-40 mL Intracatheter Q12H   Continuous Infusions: .  prismasol BGK 4/2.5 400 mL/hr at 05/04/20 0459  . sodium chloride Stopped (05/02/20 2159)  . calcium gluconate 1,000 mg (05/04/20 1401)  . ceFEPime (MAXIPIME) IV Stopped (05/03/20 2237)  . dextrose    . feeding supplement (VITAL AF 1.2 CAL) 1,000 mL (05/04/20 0916)  . heparin 1,100 Units/hr (05/04/20 1400)  . HYDROmorphone Stopped (05/03/20 1603)  . phenylephrine (NEO-SYNEPHRINE) Adult infusion 110 mcg/min (05/04/20 1400)  . prismasol BGK 2/2.5 replacement solution    . prismasol BGK 4/2.5 2,000 mL/hr at 05/04/20 1330  . [START ON 05/05/2020] vancomycin    .  vasopressin (PITRESSIN) infusion - *FOR SHOCK* 0.03 Units/min (05/04/20 1400)   PRN Meds:.sodium chloride, acetaminophen **OR** acetaminophen, alteplase, docusate sodium, heparin, HYDROmorphone, lip balm, metoprolol tartrate, ondansetron, polyethylene glycol, sodium chloride, sodium chloride flush   Review of Systems: Review of Systems  Unable to perform ROS: Intubated    Allergies  Allergen Reactions  . Olmesartan Other (See Comments)    GFR drop  . Requip [Ropinirole] Other (See Comments)    Overactive bladder  . Valsartan Other (See Comments)    GFR drop    OBJECTIVE: Vitals:   05/04/20 1131 05/04/20 1200 05/04/20 1300 05/04/20 1400  BP:  113/89 (!) 101/59   Pulse:  78 75 82  Resp:  (!) 23 (!) 24 (!) 25  Temp: 97.6 F (36.4 C)     TempSrc: Axillary     SpO2:  100% 100% 100%  Weight:      Height:       Body mass index is 33.27 kg/m.  Physical Exam Constitutional:      Comments: Her nurse reports that she is more alert.  She was able to work with PT today and follow some commands.  Cardiovascular:     Rate and Rhythm: Regular rhythm. Tachycardia present.  Abdominal:     Comments: She has a rectal tube in place for diarrhea.  Musculoskeletal:     Comments: She has Ace wraps on her left and right legs.  No new blisters have formed since admission.  Skin:    Comments: She has no new blisters.     Lab Results Lab Results  Component Value Date   WBC 51.0 (HH) 05/04/2020   HGB 10.5 (L) 05/04/2020   HCT 31.0 (L) 05/04/2020   MCV 90.9 05/04/2020   PLT 115 (L) 05/04/2020    Lab Results  Component Value Date   CREATININE 1.35 (H) 05/04/2020   BUN 21 05/04/2020   NA 137 05/04/2020   K 5.3 (H) 05/04/2020   CL 103 05/04/2020   CO2 21 (L) 05/04/2020    Lab Results  Component Value Date   ALT 1,088 (H) 05/04/2020   AST 1,791 (H) 05/04/2020   ALKPHOS 168 (H) 05/04/2020   BILITOT 4.6 (H) 05/04/2020     Microbiology: Recent Results (from the past 240  hour(s))  Blood culture (routine x 2)     Status: None   Collection Time: 04/27/2020  1:16 PM   Specimen: BLOOD  Result Value Ref Range Status   Specimen Description BLOOD SITE NOT SPECIFIED  Final   Special Requests   Final    BOTTLES DRAWN AEROBIC AND ANAEROBIC Blood Culture results may not be optimal due to an inadequate volume of blood received in culture bottles   Culture   Final    NO GROWTH 5 DAYS Performed at [facility], [place] [address], [place], [place] [address]    Report Status 05/03/2020 FINAL  Final  Blood culture (routine x 2)     Status: None   Collection Time: 04/19/2020  7:32 PM   Specimen: BLOOD  Result Value Ref Range Status   Specimen Description BLOOD SITE NOT SPECIFIED  Final   Special Requests   Final    BOTTLES DRAWN AEROBIC AND ANAEROBIC Blood Culture results may not be optimal due to an inadequate volume of blood received in culture bottles   Culture   Final    NO GROWTH 5 DAYS Performed at [facility], [place] [address], [place], [place] [address]    Report Status 05/03/2020 FINAL  Final  SARS Coronavirus 2 by RT PCR (hospital order, performed in [facility] hospital lab) Nasopharyngeal Nasopharyngeal Swab     Status: None   Collection Time: 04/27/2020  8:19 PM   Specimen: Nasopharyngeal Swab  Result Value Ref Range Status   SARS Coronavirus 2 NEGATIVE NEGATIVE Final    Comment: (NOTE) SARS-CoV-2 target nucleic acids are NOT DETECTED.  The SARS-CoV-2 RNA is generally detectable in upper and lower respiratory specimens during the acute phase of infection. The lowest concentration of SARS-CoV-2 viral copies this assay can detect is 250 copies / mL. A negative result does not preclude SARS-CoV-2 infection and should not be used as the sole basis for treatment or other patient management decisions.  A negative result may occur with improper specimen collection / handling, submission of specimen other than nasopharyngeal swab, presence of viral  mutation(s) within the areas targeted by this assay, and inadequate number of viral copies (<250 copies / mL). A negative result must be combined with clinical observations, patient history, and epidemiological information.  Fact Sheet for Patients:   StrictlyIdeas.no  Fact Sheet for Healthcare Providers: BankingDealers.co.za  This test is not yet approved or  cleared by the [place] FDA and has been authorized for detection and/or diagnosis of SARS-CoV-2 by FDA under  an Emergency Use Authorization (EUA).  This EUA will remain in effect (meaning this test can be used) for the duration of the COVID-19 declaration under Section 564(b)(1) of the Act, 21 U.S.C. section 360bbb-3(b)(1), unless the authorization is terminated or revoked sooner.  Performed at [facility], [place] [address] [place] [address], [place], [place] [address]   MRSA PCR Screening     Status: None   Collection Time: 05/04/2020  6:50 AM   Specimen: Nasopharyngeal  Result Value Ref Range Status   MRSA by PCR NEGATIVE NEGATIVE Final    Comment:        The GeneXpert MRSA Assay (FDA approved for NASAL specimens only), is one component of a comprehensive MRSA colonization surveillance program. It is not intended to diagnose MRSA infection nor to guide or monitor treatment for MRSA infections. Performed at [facility], [place] [address] [place], [place] [address]     Michel Bickers, [place] [facility] [place] [facility] [phone] pager   [phone] cell 05/04/2020, 2:25 PM

## 2020-05-05 DIAGNOSIS — T796XXD Traumatic ischemia of muscle, subsequent encounter: Secondary | ICD-10-CM

## 2020-05-05 DIAGNOSIS — D696 Thrombocytopenia, unspecified: Secondary | ICD-10-CM

## 2020-05-05 DIAGNOSIS — T79A21D Traumatic compartment syndrome of right lower extremity, subsequent encounter: Secondary | ICD-10-CM

## 2020-05-05 DIAGNOSIS — D689 Coagulation defect, unspecified: Secondary | ICD-10-CM

## 2020-05-05 LAB — BASIC METABOLIC PANEL
Anion gap: 14 (ref 5–15)
BUN: 31 mg/dL — ABNORMAL HIGH (ref 8–23)
CO2: 21 mmol/L — ABNORMAL LOW (ref 22–32)
Calcium: 7.7 mg/dL — ABNORMAL LOW (ref 8.9–10.3)
Chloride: 102 mmol/L (ref 98–111)
Creatinine, Ser: 1.66 mg/dL — ABNORMAL HIGH (ref 0.44–1.00)
GFR calc Af Amer: 37 mL/min — ABNORMAL LOW (ref >60)
GFR calc non Af Amer: 32 mL/min — ABNORMAL LOW (ref >60)
Glucose, Bld: 133 mg/dL — ABNORMAL HIGH (ref 70–99)
Potassium: 4.7 mmol/L (ref 3.5–5.1)
Sodium: 137 mmol/L (ref 135–145)

## 2020-05-05 LAB — RENAL FUNCTION PANEL
Albumin: 2.2 g/dL — ABNORMAL LOW (ref 3.5–5.0)
Albumin: 2.5 g/dL — ABNORMAL LOW (ref 3.5–5.0)
Anion gap: 15 (ref 5–15)
Anion gap: 15 (ref 5–15)
BUN: 33 mg/dL — ABNORMAL HIGH (ref 8–23)
BUN: 28 mg/dL — ABNORMAL HIGH (ref 8–23)
CO2: 18 mmol/L — ABNORMAL LOW (ref 22–32)
CO2: 18 mmol/L — ABNORMAL LOW (ref 22–32)
Calcium: 7.7 mg/dL — ABNORMAL LOW (ref 8.9–10.3)
Calcium: 7.7 mg/dL — ABNORMAL LOW (ref 8.9–10.3)
Chloride: 103 mmol/L (ref 98–111)
Chloride: 103 mmol/L (ref 98–111)
Creatinine, Ser: 1.99 mg/dL — ABNORMAL HIGH (ref 0.44–1.00)
Creatinine, Ser: 1.48 mg/dL — ABNORMAL HIGH (ref 0.44–1.00)
GFR calc Af Amer: 30 mL/min — ABNORMAL LOW (ref >60)
GFR calc Af Amer: 43 mL/min — ABNORMAL LOW (ref >60)
GFR calc non Af Amer: 26 mL/min — ABNORMAL LOW (ref >60)
GFR calc non Af Amer: 37 mL/min — ABNORMAL LOW (ref >60)
Glucose, Bld: 124 mg/dL — ABNORMAL HIGH (ref 70–99)
Glucose, Bld: 136 mg/dL — ABNORMAL HIGH (ref 70–99)
Phosphorus: 4.9 mg/dL — ABNORMAL HIGH (ref 2.5–4.6)
Phosphorus: 4.5 mg/dL (ref 2.5–4.6)
Potassium: 5.5 mmol/L — ABNORMAL HIGH (ref 3.5–5.1)
Potassium: 4.2 mmol/L (ref 3.5–5.1)
Sodium: 136 mmol/L (ref 135–145)
Sodium: 136 mmol/L (ref 135–145)

## 2020-05-05 LAB — BPAM FFP
Blood Product Expiration Date: 202106292359
Blood Product Expiration Date: 202106292359
ISSUE DATE / TIME: 202106241920
ISSUE DATE / TIME: 202106241920
Unit Type and Rh: 6200
Unit Type and Rh: 6200

## 2020-05-05 LAB — ALDOLASE

## 2020-05-05 LAB — TYPE AND SCREEN
ABO/RH(D): A POS
Antibody Screen: NEGATIVE
Unit division: 0
Unit division: 0
Unit division: 0

## 2020-05-05 LAB — SJOGRENS SYNDROME-A EXTRACTABLE NUCLEAR ANTIBODY: SSA (Ro) (ENA) Antibody, IgG: <0.2 AI (ref 0.0–0.9)

## 2020-05-05 LAB — CBC
HCT: 31.4 % — ABNORMAL LOW (ref 36.0–46.0)
HCT: 31.9 % — ABNORMAL LOW (ref 36.0–46.0)
HCT: 27.7 % — ABNORMAL LOW (ref 36.0–46.0)
Hemoglobin: 10.4 g/dL — ABNORMAL LOW (ref 12.0–15.0)
Hemoglobin: 10.6 g/dL — ABNORMAL LOW (ref 12.0–15.0)
Hemoglobin: 9.3 g/dL — ABNORMAL LOW (ref 12.0–15.0)
MCH: 28.7 pg (ref 26.0–34.0)
MCH: 28.6 pg (ref 26.0–34.0)
MCH: 28.6 pg (ref 26.0–34.0)
MCHC: 33.1 g/dL (ref 30.0–36.0)
MCHC: 33.2 g/dL (ref 30.0–36.0)
MCHC: 33.6 g/dL (ref 30.0–36.0)
MCV: 86.5 fL (ref 80.0–100.0)
MCV: 86.2 fL (ref 80.0–100.0)
MCV: 85.2 fL (ref 80.0–100.0)
Platelets: 42 10*3/uL — ABNORMAL LOW (ref 150–400)
Platelets: 41 10*3/uL — ABNORMAL LOW (ref 150–400)
Platelets: 56 10*3/uL — ABNORMAL LOW (ref 150–400)
RBC: 3.63 MIL/uL — ABNORMAL LOW (ref 3.87–5.11)
RBC: 3.7 MIL/uL — ABNORMAL LOW (ref 3.87–5.11)
RBC: 3.25 MIL/uL — ABNORMAL LOW (ref 3.87–5.11)
RDW: 17.3 % — ABNORMAL HIGH (ref 11.5–15.5)
RDW: 17.8 % — ABNORMAL HIGH (ref 11.5–15.5)
RDW: 17.7 % — ABNORMAL HIGH (ref 11.5–15.5)
WBC: 39 10*3/uL — ABNORMAL HIGH (ref 4.0–10.5)
WBC: 39.6 10*3/uL — ABNORMAL HIGH (ref 4.0–10.5)
WBC: 41.4 10*3/uL — ABNORMAL HIGH (ref 4.0–10.5)
nRBC: 10.3 % — ABNORMAL HIGH (ref 0.0–0.2)
nRBC: 8.9 % — ABNORMAL HIGH (ref 0.0–0.2)
nRBC: 8.6 % — ABNORMAL HIGH (ref 0.0–0.2)

## 2020-05-05 LAB — PREPARE FRESH FROZEN PLASMA
Unit division: 0
Unit division: 0

## 2020-05-05 LAB — ANTI-SMITH ANTIBODY: ENA SM Ab Ser-aCnc: <0.2 AI (ref 0.0–0.9)

## 2020-05-05 LAB — HEPATIC FUNCTION PANEL
ALT: 607 U/L — ABNORMAL HIGH (ref 0–44)
AST: 1119 U/L — ABNORMAL HIGH (ref 15–41)
Albumin: 2.2 g/dL — ABNORMAL LOW (ref 3.5–5.0)
Alkaline Phosphatase: 141 U/L — ABNORMAL HIGH (ref 38–126)
Bilirubin, Direct: 1.9 mg/dL — ABNORMAL HIGH (ref 0.0–0.2)
Indirect Bilirubin: 2.3 mg/dL — ABNORMAL HIGH (ref 0.3–0.9)
Total Bilirubin: 4.2 mg/dL — ABNORMAL HIGH (ref 0.3–1.2)
Total Protein: 3.5 g/dL — ABNORMAL LOW (ref 6.5–8.1)

## 2020-05-05 LAB — MAGNESIUM: Magnesium: 2.6 mg/dL — ABNORMAL HIGH (ref 1.7–2.4)

## 2020-05-05 LAB — PROTIME-INR
INR: 2.2 — ABNORMAL HIGH (ref 0.8–1.2)
INR: 1.7 — ABNORMAL HIGH (ref 0.8–1.2)
Prothrombin Time: 23.6 seconds — ABNORMAL HIGH (ref 11.4–15.2)
Prothrombin Time: 19.2 seconds — ABNORMAL HIGH (ref 11.4–15.2)

## 2020-05-05 LAB — GLUCOSE, CAPILLARY
Glucose-Capillary: 112 mg/dL — ABNORMAL HIGH (ref 70–99)
Glucose-Capillary: 116 mg/dL — ABNORMAL HIGH (ref 70–99)
Glucose-Capillary: 120 mg/dL — ABNORMAL HIGH (ref 70–99)
Glucose-Capillary: 130 mg/dL — ABNORMAL HIGH (ref 70–99)
Glucose-Capillary: 114 mg/dL — ABNORMAL HIGH (ref 70–99)

## 2020-05-05 LAB — BPAM RBC
Blood Product Expiration Date: 202107092359
Blood Product Expiration Date: 202107092359
Blood Product Expiration Date: 202107092359
ISSUE DATE / TIME: 202106242359
ISSUE DATE / TIME: 202106242359
ISSUE DATE / TIME: 202106242359
Unit Type and Rh: 6200
Unit Type and Rh: 6200
Unit Type and Rh: 6200

## 2020-05-05 LAB — DIC (DISSEMINATED INTRAVASCULAR COAGULATION)PANEL
D-Dimer, Quant: 1.86 ug/mL-FEU — ABNORMAL HIGH (ref 0.00–0.50)
Fibrinogen: 224 mg/dL (ref 210–475)
INR: 2.7 — ABNORMAL HIGH (ref 0.8–1.2)
Platelets: 70 10*3/uL — ABNORMAL LOW (ref 150–400)
Prothrombin Time: 28 seconds — ABNORMAL HIGH (ref 11.4–15.2)
Smear Review: NONE SEEN
aPTT: 79 seconds — ABNORMAL HIGH (ref 24–36)

## 2020-05-05 LAB — SJOGRENS SYNDROME-B EXTRACTABLE NUCLEAR ANTIBODY: SSB (La) (ENA) Antibody, IgG: <0.2 AI (ref 0.0–0.9)

## 2020-05-05 LAB — MPO/PR-3 (ANCA) ANTIBODIES
ANCA Proteinase 3: <3.5 U/mL (ref 0.0–3.5)
Myeloperoxidase Abs: <9 U/mL (ref 0.0–9.0)

## 2020-05-05 LAB — ANCA TITERS
Atypical P-ANCA titer: <1:20 {titer}
C-ANCA: <1:20 {titer}
P-ANCA: <1:20 {titer}

## 2020-05-05 LAB — LACTOFERRIN, FECAL, QUALITATIVE: Lactoferrin, Fecal, Qual: POSITIVE — AB

## 2020-05-05 LAB — CK: Total CK: 7938 U/L — ABNORMAL HIGH (ref 38–234)

## 2020-05-05 LAB — LACTIC ACID, PLASMA
Lactic Acid, Venous: 5.5 mmol/L (ref 0.5–1.9)
Lactic Acid, Venous: 4.9 mmol/L (ref 0.5–1.9)

## 2020-05-05 LAB — ANA: Anti Nuclear Antibody (ANA): NEGATIVE

## 2020-05-05 LAB — PATHOLOGIST SMEAR REVIEW

## 2020-05-05 MED ORDER — PRISMASOL BGK 0/2.5 32-2.5 MEQ/L IV SOLN
INTRAVENOUS | Status: DC
  Filled 2020-05-05 (×16): qty 5000

## 2020-05-05 MED ORDER — "THROMBI-PAD 3""X3"" EX PADS"
3.0000 | MEDICATED_PAD | Freq: Once | CUTANEOUS | Status: DC
  Filled 2020-05-05: qty 3

## 2020-05-05 MED ORDER — PRISMASOL BGK 0/2.5 32-2.5 MEQ/L IV SOLN
INTRAVENOUS | Status: DC
  Filled 2020-05-05 (×4): qty 5000

## 2020-05-05 MED ORDER — LORAZEPAM 2 MG/ML IJ SOLN: 2.0000 mg | INTRAMUSCULAR | Status: DC | PRN

## 2020-05-05 MED ORDER — SODIUM CHLORIDE 0.9% IV SOLUTION: Freq: Once | INTRAVENOUS | Status: AC

## 2020-05-05 MED ORDER — PANTOPRAZOLE SODIUM 40 MG IV SOLR
40.0000 mg | Freq: Two times a day (BID) | INTRAVENOUS | Status: DC
  Administered 2020-05-05 (×2): 40 mg via INTRAVENOUS
  Filled 2020-05-05 (×2): qty 40

## 2020-05-05 MED ORDER — PRISMASOL BGK 0/2.5 32-2.5 MEQ/L IV SOLN
INTRAVENOUS | Status: DC
  Filled 2020-05-05 (×3): qty 5000

## 2020-05-05 MED ORDER — FENTANYL CITRATE (PF) 100 MCG/2ML IJ SOLN: 25.0000 ug | INTRAMUSCULAR | Status: DC | PRN

## 2020-05-05 MED FILL — Sodium Chloride IV Soln 0.9%: INTRAVENOUS | Qty: 250 | Status: AC

## 2020-05-05 MED FILL — Phenylephrine HCl IV Soln 10 MG/ML: INTRAVENOUS | Qty: 100 | Status: AC

## 2020-05-05 NOTE — Progress Notes (Signed)
  Progress Note    05/05/2020 8:56 AM 5 Days Post-Op  Subjective: Remains intubated and sedated Overnight had blood loss from fasciotomy sites left lower extremity  Vitals:   05/05/20 0733 05/05/20 0800  BP: (!) 131/53 (!) 145/51  Pulse: 72 69  Resp: (!) 26 (!) 26  Temp: 99.5 F (37.5 C)   SpO2:  100%    Physical Exam: Intubated sedated. Right AKA dressing clean dry intact Bilateral upper extremities are warm with edema bilaterally Left lower extremity fasciotomy sites repacked at bedside there is a strong anterior tibial artery signal on the left   CBC    Component Value Date/Time   WBC 41.8 (H) 05/05/2020 0404   RBC 3.61 (L) 05/05/2020 0404   HGB 10.5 (L) 05/05/2020 0404   HCT 32.0 (L) 05/05/2020 0404   PLT 53 (L) 05/05/2020 0404   MCV 88.6 05/05/2020 0404   MCH 29.1 05/05/2020 0404   MCHC 32.8 05/05/2020 0404   RDW 17.0 (H) 05/05/2020 0404   LYMPHSABS 0.3 (L) 05/04/2020 1139   MONOABS 1.7 (H) 04/26/2020 1139   EOSABS 0.0 04/21/2020 1139   BASOSABS 0.0 04/17/2020 1139    BMET    Component Value Date/Time   NA 136 05/05/2020 0404   K 5.5 (H) 05/05/2020 0404   CL 103 05/05/2020 0404   CO2 18 (L) 05/05/2020 0404   GLUCOSE 124 (H) 05/05/2020 0404   BUN 33 (H) 05/05/2020 0404   CREATININE 1.99 (H) 05/05/2020 0404   CALCIUM 7.7 (L) 05/05/2020 0404   GFRNONAA 26 (L) 05/05/2020 0404   GFRAA 30 (L) 05/05/2020 0404    INR    Component Value Date/Time   INR 2.2 (H) 05/05/2020 0404     Intake/Output Summary (Last 24 hours) at 05/05/2020 0856 Last data filed at 05/05/2020 0800 Gross per 24 hour  Intake 10341.79 ml  Output 1749 ml  Net 8592.79 ml     Assessment/plan:  64 y.o. female is left lower extremity 4 compartment fasciotomies and right lower extremity embolectomy that required AKA.  We have also followed her for ischemia the right upper extremity she appeared to have ulnar artery flow yesterday.  Heparin drip was held with supratherapeutic INR  yesterday she did have bleeding from her fasciotomy sites.  Fasciotomy sites are clean on exam this morning they were repacked they are not yet ready for closure.   Makayah Pauli C. Donzetta Matters, MD [facility] Office: [phone] Pager: [phone]  05/05/2020 8:56 AM

## 2020-05-05 NOTE — Progress Notes (Signed)
OT Cancellation Note  Patient Details Name: [name] MRN: [id number] DOB: [birth year]   Cancelled Treatment:    Reason Eval/Treat Not Completed: Patient not medically ready Per RN, pt with plans to go comfort care tomorrow. OT will discharge pt from caseload.    Resting hand splints in room, unopened. Therapist called [employer] to discuss what to do with unopened splints. [employer] reports they will call [facility]anger to determine how to handle situation.   [facility] OTR/L Acute Rehabilitation Services Office: [place] 05/05/2020, 3:50 PM

## 2020-05-05 NOTE — Progress Notes (Signed)
Dr. Donzetta Matters and Dr. Lucile Shutters made aware of patient still oozing from fasciotomy site. Will transfuse 3 units of FFP's now and reinforce LLE dressing.

## 2020-05-05 NOTE — Progress Notes (Signed)
Left lower leg dressing somewhat saturated with blood from fasciotomies. Notified Dr. Carlis Abbott who is ordering platelets and FFP transfusions to help with the bleeding. Will continue to monitor.  Dewaine Oats, RN

## 2020-05-05 NOTE — Progress Notes (Addendum)
Nutrition Follow-up  DOCUMENTATION CODES:   Not applicable  INTERVENTION:   If plans to continue supportive care, recommend resume TF via OG tube: Vital AF 1.2 at 50 ml/h (1200 ml per day) Pro-stat 30 ml BID  Provides 1640 kcal, 120 gm protein, 973 ml free water daily.  Continue B-complex with vitamin C while on CRRT.   NUTRITION DIAGNOSIS:   Increased nutrient needs related to wound healing, acute illness as evidenced by estimated needs.  Ongoing  GOAL:   Patient will meet greater than or equal to 90% of their needs   Unmet with TF off  MONITOR:   Vent status, Labs, TF tolerance, Skin, I & O's  REASON FOR ASSESSMENT:   Ventilator, Consult Enteral/tube feeding initiation and management  ASSESSMENT:   64 yo female admitted with rhabdomyolysis. PMH includes CKD, HTN, balance disorder.   6/20 - S/P R lower extremity thromboembolectomy of popliteal, anterior tibial and posterior tibial arteries and 4 compartment fasciotomy.  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  CRRT was held for ~12 hours yesterday due to decreased blood pressure. CRRT resumed today.   TF was initiated 6/21. Vital AF 1.2 goal is 50 ml/h; TF was decreased to 25 ml/h yesterday morning due to concern for abdominal process, then stopped around 7pm. 600 ml NG output documented 6/24.  Patient remains intubated on ventilator support; noted poor prognosis. May transition to comfort care this weekend.  MV: 10.9 L/min Temp (24hrs), Avg:98.1 F (36.7 C), Min:96 F (35.6 C), Max:99.5 F (37.5 C) MAP (cuff) range 59-83 this morning  Labs reviewed. BUN 31 (H), creat 1.66 (H), phos 4.9 (H) CBG: 112-116-120-130  Medications reviewed and include novolog, solu-medrol, phenylephrine, vasopressin.  Weight 83.6 kg on 6/19, 86.7 kg today I/O +13.5 L   Diet Order:   Diet Order            Diet NPO time specified  Diet effective now                 EDUCATION NEEDS:   Not  appropriate for education at this time  Skin:  Skin Assessment: Skin Integrity Issues: Skin Integrity Issues:: Incisions Incisions: L leg, R thigh  Last BM:  6/25  Height:   Ht Readings from Last 1 Encounters:  05/07/2020 5\' 2"  (1.575 m)    Weight:   Wt Readings from Last 1 Encounters:  05/05/20 87.7 kg    Ideal Body Weight:  46 kg (adjusted for AKA)  Estimated Nutritional Needs:   Kcal:  1610  Protein:  110-125 gm  Fluid:  1.6-1.8 L    Lucas Mallow, RD, LDN, CNSC Please refer to Amion for contact information.

## 2020-05-05 NOTE — Progress Notes (Signed)
[facility] Progress Note Patient Name: [name] DOB: 06-28-1956 MRN: [id number]   Date of Service  05/05/2020  HPI/Events of Note  Patient resumed oozing from fasciotomy site, she also has guaiac + stools, Fibrinogen  216, INR  2.7,  Platelet count 75 K  eICU Interventions  Transfuse 3 units of FFP now        [name] U Erline Siddoway 05/05/2020, 3:09 AM

## 2020-05-05 NOTE — Progress Notes (Signed)
Patient ID: [name], female   DOB: 02/08/1956, 64 y.o.   MRN: [id number]         [facility] for Infectious Disease  Date of Admission:  04/22/2020   Total days of antibiotics 6        Day 5 vancomycin        Day 2 piperacillin tazobactam         ASSESSMENT: We still have no unifying diagnosis for her rhabdomyolysis, diffuse rigidity, encephalopathy, coagulopathy, and acute on chronic renal insufficiency.  We have not identified any infectious cause or clear-cut culprit drug.  Because of her severe multisystem failure and persistent hypotension she remains on broad empiric antibiotic therapy and empiric steroids.  PLAN: 1. Continue vancomycin and piperacillin tazobactam 2. We will follow with you  Principal Problem:   Blisters of multiple sites Active Problems:   Compartment syndrome of right lower extremity (HCC)   Rhabdomyolysis   Respiratory failure (HCC)   Acute renal failure superimposed on stage 3 chronic kidney disease ([facility] [facility])   AMS (altered mental status)   Hemorrhagic shock (HCC)   Scheduled Meds: . artificial tears   Right Eye Q8H  . chlorhexidine gluconate (MEDLINE KIT)  15 mL Mouth Rinse BID  . Chlorhexidine Gluconate Cloth  6 each Topical Daily  . feeding supplement (PRO-STAT SUGAR FREE 64)  30 mL Per Tube BID  . insulin aspart  0-15 Units Subcutaneous Q4H  . mouth rinse  15 mL Mouth Rinse 10 times per day  . pantoprazole (PROTONIX) IV  40 mg Intravenous Q12H  . rOPINIRole  2 mg Per Tube Daily  . sodium chloride flush  10-40 mL Intracatheter Q12H   Continuous Infusions: . sodium chloride Stopped (05/04/20 1752)  . amiodarone 30 mg/hr (05/04/20 2220)  . dextrose 15 mL/hr at 05/05/20 1300  . feeding supplement (VITAL AF 1.2 CAL) Stopped (05/04/20 1725)  . methylPREDNISolone (SOLU-MEDROL) injection Stopped (05/05/20 0806)  . phenylephrine (NEO-SYNEPHRINE) Adult infusion 205 mcg/min (05/05/20 1300)  . piperacillin-tazobactam Stopped (05/05/20 1007)    . prismasol BGK 2/2.5 dialysis solution 2,000 mL/hr at 05/05/20 1044  . prismasol BGK 2/2.5 replacement solution 400 mL/hr at 05/05/20 0812  . vancomycin Stopped (05/05/20 1220)  . vasopressin (PITRESSIN) infusion - *FOR SHOCK* 0.04 Units/min (05/05/20 1300)   PRN Meds:.sodium chloride, acetaminophen **OR** acetaminophen, alteplase, docusate sodium, heparin, lip balm, LORazepam, metoprolol tartrate, ondansetron, polyethylene glycol, sodium chloride, sodium chloride flush   Review of Systems: Review of Systems  Unable to perform ROS: Intubated    Allergies  Allergen Reactions  . Olmesartan Other (See Comments)    GFR drop  . Requip [Ropinirole] Other (See Comments)    Overactive bladder  . Valsartan Other (See Comments)    GFR drop    OBJECTIVE: Vitals:   05/05/20 1105 05/05/20 1106 05/05/20 1200 05/05/20 1300  BP: (!) 117/52  (!) 127/54 (!) 130/58  Pulse:   (!) 58 (!) 57  Resp: (!) 27  19 (!) 24  Temp:  (!) 96.4 F (35.8 C)    TempSrc:  Axillary    SpO2:   100% 100%  Weight:      Height:       Body mass index is 35.36 kg/m.  Physical Exam Constitutional:      Comments: She is unresponsive.  Cardiovascular:     Rate and Rhythm: Regular rhythm. Bradycardia present.     Heart sounds: No murmur heard.   Pulmonary:     Breath sounds: Normal breath  sounds.  Abdominal:     General: There is distension.     Palpations: Abdomen is soft.     Comments: Her diarrhea persists.  C. difficile screen was negative.  Musculoskeletal:     Comments: She has Ace wraps on her left and right legs.    Skin:    Comments: She has no new blisters.     Lab Results Lab Results  Component Value Date   WBC 39.0 (H) 05/05/2020   HGB 10.4 (L) 05/05/2020   HCT 31.4 (L) 05/05/2020   MCV 86.5 05/05/2020   PLT 42 (L) 05/05/2020    Lab Results  Component Value Date   CREATININE 1.66 (H) 05/05/2020   BUN 31 (H) 05/05/2020   NA 137 05/05/2020   K 4.7 05/05/2020   CL 102 05/05/2020    CO2 21 (L) 05/05/2020    Lab Results  Component Value Date   ALT 607 (H) 05/05/2020   AST 1,119 (H) 05/05/2020   ALKPHOS 141 (H) 05/05/2020   BILITOT 4.2 (H) 05/05/2020     Microbiology: Recent Results (from the past 240 hour(s))  Blood culture (routine x 2)     Status: None   Collection Time: 05/07/2020  1:16 PM   Specimen: BLOOD  Result Value Ref Range Status   Specimen Description BLOOD SITE NOT SPECIFIED  Final   Special Requests   Final    BOTTLES DRAWN AEROBIC AND ANAEROBIC Blood Culture results may not be optimal due to an inadequate volume of blood received in culture bottles   Culture   Final    NO GROWTH 5 DAYS Performed at [facility], [place] [address], [place], [place] [address]    Report Status 05/03/2020 FINAL  Final  Blood culture (routine x 2)     Status: None   Collection Time: 04/11/2020  7:32 PM   Specimen: BLOOD  Result Value Ref Range Status   Specimen Description BLOOD SITE NOT SPECIFIED  Final   Special Requests   Final    BOTTLES DRAWN AEROBIC AND ANAEROBIC Blood Culture results may not be optimal due to an inadequate volume of blood received in culture bottles   Culture   Final    NO GROWTH 5 DAYS Performed at [facility], [place] [address] [place], [place] [address]    Report Status 05/03/2020 FINAL  Final  SARS Coronavirus 2 by RT PCR (hospital order, performed in [facility] hospital lab) Nasopharyngeal Nasopharyngeal Swab     Status: None   Collection Time: 04/21/2020  8:19 PM   Specimen: Nasopharyngeal Swab  Result Value Ref Range Status   SARS Coronavirus 2 NEGATIVE NEGATIVE Final    Comment: (NOTE) SARS-CoV-2 target nucleic acids are NOT DETECTED.  The SARS-CoV-2 RNA is generally detectable in upper and lower respiratory specimens during the acute phase of infection. The lowest concentration of SARS-CoV-2 viral copies this assay can detect is 250 copies / mL. A negative result does not preclude SARS-CoV-2 infection and should not  be used as the sole basis for treatment or other patient management decisions.  A negative result may occur with improper specimen collection / handling, submission of specimen other than nasopharyngeal swab, presence of viral mutation(s) within the areas targeted by this assay, and inadequate number of viral copies (<250 copies / mL). A negative result must be combined with clinical observations, patient history, and epidemiological information.  Fact Sheet for Patients:   StrictlyIdeas.no  Fact Sheet for Healthcare Providers: BankingDealers.co.za  This test is not yet approved or  cleared by the [place] and has been authorized for detection and/or diagnosis of SARS-CoV-2 by FDA under an Emergency Use Authorization (EUA).  This EUA will remain in effect (meaning this test can be used) for the duration of the COVID-19 declaration under Section 564(b)(1) of the Act, 21 U.S.C. section 360bbb-3(b)(1), unless the authorization is terminated or revoked sooner.  Performed at [facility], [place] [address] [place], [place] [address]   MRSA PCR Screening     Status: None   Collection Time: 04/21/2020  6:50 AM   Specimen: Nasopharyngeal  Result Value Ref Range Status   MRSA by PCR NEGATIVE NEGATIVE Final    Comment:        The GeneXpert MRSA Assay (FDA approved for NASAL specimens only), is one component of a comprehensive MRSA colonization surveillance program. It is not intended to diagnose MRSA infection nor to guide or monitor treatment for MRSA infections. Performed at [facility], [place] [address], [place], [place] [address]   C Difficile Quick Screen (NO PCR Reflex)     Status: None   Collection Time: 05/04/20  2:10 PM   Specimen: STOOL  Result Value Ref Range Status   C Diff antigen NEGATIVE NEGATIVE Final   C Diff toxin NEGATIVE NEGATIVE Final   C Diff interpretation No C. difficile detected.  Final     Comment: Performed at [facility], [place] [address] [place], [place] [address]    Michel Bickers, [place] [facility] [facility] [phone] pager   [phone] cell 05/05/2020, 1:11 PM

## 2020-05-05 NOTE — Progress Notes (Signed)
Dr. Donzetta Matters at bedside. Bleeding is controlled with thrombi pads, Kerlix, abd's, and ace wraps.  Will call him back if she starts bleeding from this leg again. Continuing to monitor patient.

## 2020-05-05 NOTE — Progress Notes (Signed)
Orthopedic Tech Progress Note Patient Details:  DERIONA ALTEMOSE 01-Jan-1956 [id number] Called [facility] to cancel order on brace from yesterday  Patient ID: Bryson Ha, female   DOB: Apr 08, 1956, 64 y.o.   MRN: [id number]   Janit Pagan 05/05/2020, 3:48 PM

## 2020-05-05 NOTE — Progress Notes (Signed)
NAME:  [name], MRN:  [id number], DOB:  1955-12-10, LOS: 7 ADMISSION DATE:  05/02/2020, CONSULTATION DATE:  05/05/20 REFERRING MD:  Anesthesia, CHIEF COMPLAINT:  Respiratory failure   Brief History   64 y.o. F who fell on 6/18 and developed Rhabdo and worsening RLE pain, taken to the OR on 6/20 for thromboembolectomy and fasciotomy, was extubated and then had to be re-intubated and found to be profoundly anemic.  PCCM consulted for transfer to ICU.   History of present illness   64 y.o. F with PMH HTN, CKD and RLS who was initially admitted 6/18 after rolling out bed and being down on the floor for several hours. She was treated for rhabdo with IVF and admitted, later developed mottling of the RLE and taken to the OR 6/20 for thromboembolectomy and compartment fasciotomies. Post-op was extubated and awake, then developed worsening respiratory status and was re-intubated. Labs returned with Hgb 4.2, K 7.3, pH 7.0 and pt requiring Neo gtt.    Per report, EBL 1L intraoperatively.  Patient was given 2 A bicarb, calcium, insulin, 4 units PRBC ordered and transferred to the intensive care unit for the initiation of CRRT.  Past Medical History   has a past medical history of Balance disorder, Hypertension, and Renal disorder.  Significant Hospital Events   6/18 Admit to internal medicine 6/20 > RLE thromboembolectomy and fasciotomy, transfer to ICU 6/21>RLE AKA, LLE fasciotomy x 4   Consults:  Vascular surgery PCCM Nephrology neurology  Procedures:  ETT 6/20 6/20 > RLE thromboembolectomy and fasciotomy, transfer to ICU 6/21>RLE AKA, LLE fasciotomy x 4   Significant Diagnostic Tests:  6/18 CT head>> no acute findings 6/18 MRI brain>>unremarkable 6/23 RUQ US> no gallstones or biliary dilatation. Possible hepatic steatosis  6/23 MRI brain>> normal for age 67/23 EEG>> diffuse encephalopathy, no evidence of seizure  6/24 CT a/p> no evidence of bowel ischemia. Fluid filled large and  small bowel without transition point, ileus pattern. No distention or wall thickening, no inflammatory change.   Possible solid lesion from lower R kidney 43mm, indeterminate.  6/24 CT BLE> no evidence of compartment syndrome   Micro Data:  6/18 blood cultures x2>> 6/18 SARS-Covid-2>> negative  Antimicrobials:  Ancef 6/20 Cefepime 6/21> vanc 6/21> clinda 6/21>6/22  Interim history/subjective:  Bleeding overnight from LLE fasciotomy sites, required pRBCs & FFP. Amiodarone turned off for bradycardia.  CRRT off for about 12 hours, restarted this morning at 6:15.  Objective   Blood pressure (!) 126/50, pulse 76, temperature 98.8 F (37.1 C), temperature source Oral, resp. rate (!) 26, height 5\' 2"  (1.575 m), weight 87.7 kg, SpO2 100 %.    Vent Mode: PRVC FiO2 (%):  [30 %-50 %] 40 % Set Rate:  [26 bmp] 26 bmp Vt Set:  [400 mL] 400 mL PEEP:  [10 cmH20] 10 cmH20 Plateau Pressure:  [15 cmH20] 15 cmH20   Intake/Output Summary (Last 24 hours) at 05/05/2020 0721 Last data filed at 05/05/2020 0700 Gross per 24 hour  Intake 10421.55 ml  Output 1873 ml  Net 8548.55 ml   Filed Weights   05/03/20 0500 05/04/20 0500 05/05/20 0448  Weight: 82.9 kg 82.5 kg 87.7 kg   General: Critically ill-appearing woman lying in bed sedated, not sedated Neuro: Not arousable to verbal stimulation, mild grimacing with significant stimulation.  Left pupil reactive, right eye chronically nonreactive.  Rigidity in arms persists, better than yesterday's exam. HEENT: [employer]/AT, bruising on her tongue, ET tube in place. CV: Regular rate and  rhythm, no murmurs PULM: Clear to auscultation bilaterally, breathing comfortably on the vent.  Minimal clear thin secretions from ET tube. GI: Soft, tender to palpation, distended, tympanic to percussion Extremities: Tight compressive dressing on left lower extremity, but not soaked with blood.  Cyanotic left foot.  Minimal dried blood on bandage from right AKA. Hands cyanotic.  R>L  upper extremity edema, blanching of right volar surface with finger extension. Skin: Unroofed blisters covered on legs.  Cyanotic in all extremities with sluggish cap refill.  Bruising on anterior and lateral left breast, buttocks, lower abdomen, hands.   Resolved Hospital Problem list   Compartment syndrome BLE Acute limb ischemia BLE Hyperphosphatemia  Assessment & Plan:   Acute metabolic encephalopathy, worsening.  Likely metabolic in setting of worsening acidosis, sepsis unknown source, renal failure, but muscle rigidity raises question for CNS cause, although EEG and brain MRI have been normal.  She has been on empiric antibiotics, diminishing the likelihood for an acute CNS infection. -Minimize sedation as much as possible.  Fentanyl as needed for pain. -Continue to monitor and correct electrolyte abnormalities -CRRT -Cefepime discontinued  Increased muscle tone BUE, some interval improvement . Hx RLS previously on requip, changed to gabapentin outpt.  Symptoms improve transiently with benzodiazepines.  Unlikely viral infectious cause of myositis.  Given her chronic outpatient neurologic symptoms, there is concern for an underlying chronic neuromuscular disease process. -Appreciate neurology's input.  Ativan every 4 hours as needed for significant rigidity -Continue Requip at PTA dose -Pulse dose steroids to treat empirically for possible myositis. -Autoimmune myositis panel pending  Acute hypoxic respiratory failure requiring mechanical ventilation due to severe acidosis, encephalopathy requiring MV. Reintubated after respiratory decompemsation in PACU.  -Continue lung protective ventilation with goal P plat less than 30 and driving pressure less than 15.  Titrate PEEP and FiO2 per ARDS protocol.  Goal saturations greater than 90%. -Daily SAT and SBT.  Mental status currently precludes extubation. -VAP prevention protocol -when progressed to extubation readiness, ensure cuff leak  (multiple intubations)  Shock-likely multifactorial.  Acutely hypovolemic shock likely contributing.  Underlying distributive shock is most likely, although the etiology remains elusive.  Being treated empirically for adrenal insufficiency and sepsis of unknown source.  No suspicion for anaphylaxis.  Bedside echo on 6/24 with normal LV function, normal RV size and function. -Appreciate infectious disease team's recommendations.  Due to decompensation on 6/24, gram-negative coverage empirically restarted.  Continue vancomycin.  A. fib with RVR, now back in sinus rhythm.  Poorly tolerant of change in rhythm causing significant hypotension. -Amiodarone discontinued due to bradycardia -Holding anticoagulation due to significant bleeding-continue telemetry monitoring -Neo-Synephrine and vasopressin favored over norepinephrine due to previous intolerance with switching to norepinephrine.  Lactic acidosis-despite negative CT scan, concern for intra-abdominal process. Possibly colitis with diarrhea, distention, +lactoferrin, and heme+ stool. C. Diff negative. -Continue Zosyn -NG tube to LIWS; holding tube feeds   ABLA, improved Hemorrhagic shock, improved -in setting of LLE fasciotomies, RLE thrombectomy, RLE AKA  Received 9 units pRBCs this admission  -trend CBC -Holding heparin -Continue compressive dressing to left lower extremity -Transfuse for hemoglobin less than 7 or hemodynamically significant bleeding.  Compartment syndrome BLE due to rhabdo, s/p AKA on R after unsuccessful fasciotomy on R, fasciotomy on L -Appreciate vascular surgery's assistance -Holding anticoagulation to minimize bleeding from fasciotomy sites  Acute on chronic renal failure  Rhabdomyolysis  -Ck continues to downtrend  -degree of CK elevation seems somewhat out of proportion to downtime 3-4 hrs P -CRRT  per nephrology.  Held overnight due to hypotension -Strict I's/O.  Bladder scan per protocol while Foley  removed. -Renally dose meds and avoid nephrotoxic meds  Transaminitis, improving, due to shock liver and muscle enzyme leak from rhabdo -Continue to monitor LFTs  Coagulopathy Thrombocytopenia, mild although interval worsening  Both likely due to critical illness.  No hypofibrinogenemia (although dropping) or schistocytes to suggest DIC. -Continue to monitor and replete as needed  Skin bullae, likely due to compartment syndrom -Wound care to keep sites covered  Hyperglycemia with intermittent hypoglycemia -Holding long-acting insulin -Sliding scale insulin as needed  Right radial artery occlusion-likely sequela of previous brachial A-line versus hypercoagulable state -Hypercoagulability panel pending -Heparin on hold due to bleeding   Worsening despite aggressive medical care without a clear diagnosis portends a very poor prognosis.  Sepsis with an unknown source remains a possible contributor driving the metabolic abnormalities, but the muscle rigidity remains less consistent with this.  Overall prognosis worsening as she fails to improve.   Best practice:  Diet: EN, decreasing rate in setting of possible ileus  Pain/Anxiety/Delirium protocol (if indicated): Fentanyl/propofol VAP protocol (if indicated): HOB 30 degrees, suction as needed DVT prophylaxis: Heparin  GI prophylaxis: Protonix Glucose control: SSI, basal bolus insulin Mobility: Bedrest Code Status: DNR Family Communication: Daughter updated daily Disposition: ICU  Labs   CBC: Recent Labs  Lab 04/12/2020 1139 04/20/2020 1816 05/04/20 0146 05/04/20 0759 05/04/20 0923 05/04/20 0939 05/04/20 1737 05/04/20 2201 05/04/20 2316 05/05/20 0404  WBC 28.1*   < > 47.3*  --  51.0*  --  50.6* 37.3*  --  41.8*  NEUTROABS 26.1*  --   --   --   --   --   --   --   --   --   HGB 15.1*   < > 9.0*  --  10.0* 10.5* 7.6* 5.6*  --  10.5*  HCT 48.6*   < > 27.5*  --  30.9* 31.0* 24.6* 17.9*  --  32.0*  MCV 88.0   < > 92.0  --   90.9  --  96.9 95.7  --  88.6  PLT 352   < > 109*   < > 115*  --  94* 75* 70* 53*   < > = values in this interval not displayed.    Basic Metabolic Panel: Recent Labs  Lab 05/09/2020 0531 04/25/2020 1138 05/02/20 0513 05/02/20 1048 05/03/20 0437 05/03/20 0437 05/03/20 1922 05/03/20 1922 05/04/20 0146 05/04/20 0148 05/04/20 0148 05/04/20 0939 05/04/20 1209 05/04/20 1422 05/04/20 1737 05/05/20 0404  NA 136   < > 135   < > 138   < > 135   < >  --  138   < > 139 137 138 139 136  K 5.2*   < > 3.7   < > 4.7   < > 5.3*   < >  --  4.7   < > 4.8 5.3* 5.1 5.5* 5.5*  CL 106   < > 100   < > 105   < > 103   < >  --  107  --   --  103 105 104 103  CO2 14*   < > 22   < > 22   < > 16*   < >  --  17*  --   --  21* 19* 15* 18*  GLUCOSE 100*   < > 251*   < > 134*   < > 283*   < >  --  105*  --   --  118* 111* 86 124*  BUN 60*   < > 31*   < > 20   < > 22   < >  --  22  --   --  21 24* 20 33*  CREATININE 4.23*   < > 2.21*   < > 1.43*   < > 1.71*   < >  --  1.61*  --   --  1.35* 1.41* 1.43* 1.99*  CALCIUM 6.8*   < > 6.7*   < > 7.8*   < > 7.4*   < >  --  7.9*  --   --  8.0* 8.0* 7.7* 7.7*  MG 2.4  --  2.3  --  2.5*  --   --   --  2.5*  --   --   --   --   --   --  2.6*  PHOS 9.6*   < > 5.4*   < > 3.3   < > 5.7*  --   --  4.0  --   --   --  4.3 6.5* 4.9*   < > = values in this interval not displayed.   GFR: Estimated Creatinine Clearance: 29 mL/min (A) (by C-G formula based on SCr of 1.99 mg/dL (H)). Recent Labs  Lab 05/03/20 2229 05/04/20 0130 05/04/20 0146 05/04/20 0923 05/04/20 1737 05/04/20 2036 05/04/20 2201 05/05/20 0404  WBC  --   --    < > 51.0* 50.6*  --  37.3* 41.8*  LATICACIDVEN 9.2* 8.7*  --   --  >11.0* >11.0*  --   --    < > = values in this interval not displayed.    Liver Function Tests: Recent Labs  Lab 05/02/20 0513 05/02/20 1540 05/03/20 0437 05/03/20 1922 05/04/20 0146 05/04/20 0146 05/04/20 0148 05/04/20 1209 05/04/20 1422 05/04/20 1737 05/05/20 0404  AST  7,064*  --  3,512*  --  1,856*  --   --  1,791*  --   --  1,119*  ALT 3,896*  --  2,109*  --  1,210*  --   --  1,088*  --   --  607*  ALKPHOS 110  --  159*  --  148*  --   --  168*  --   --  141*  BILITOT 2.8*  --  2.9*  --  3.1*  --   --  4.6*  --   --  4.2*  PROT 3.8*  --  4.6*  --  4.5*  --   --  4.3*  --   --  3.5*  ALBUMIN 2.5*   2.4*   < > 2.5*   2.5*   < > 3.2*   < > 3.2* 2.9* 2.9* 1.9* 2.2*   2.2*   < > = values in this interval not displayed.   Recent Labs  Lab 05/08/2020 1139 05/04/20 2036  LIPASE 29 67*   Recent Labs  Lab 05/02/2020 1636 05/04/20 0759  AMMONIA 22 31    ABG    Component Value Date/Time   PHART 7.231 (L) 04/17/2020 0000   PCO2ART 30.8 (L) 04/24/2020 0000   PO2ART 282 (H) 04/23/2020 0000   HCO3 23.5 05/04/2020 0939   TCO2 25 05/04/2020 0939   ACIDBASEDEF 3.0 (H) 05/04/2020 0939   O2SAT 54.0 05/04/2020 0939     Coagulation Profile: Recent Labs  Lab 05/03/20 0909 05/04/20 0759 05/04/20 1744 05/04/20 2316 05/05/20 0404  INR  1.5* 2.1* 3.2* 2.7*   2.7* 2.2*    Cardiac Enzymes: Recent Labs  Lab 04/18/2020 0531 05/02/20 0513 05/03/20 0437 05/03/20 2229 05/05/20 0404  CKTOTAL >50,000* >50,000* 23,880* 9,257* 7,938*    HbA1C: Hgb A1c MFr Bld  Date/Time Value Ref Range Status  04/29/2020 02:15 AM 6.3 (H) 4.8 - 5.6 % Final    Comment:    (NOTE) Pre diabetes:          5.7%-6.4%  Diabetes:              >6.4%  Glycemic control for   <7.0% adults with diabetes     CBG: Recent Labs  Lab 05/04/20 1132 05/04/20 1742 05/04/20 1928 05/04/20 2311 05/05/20 0311  GLUCAP 114* 71 118* 112* 112*    This patient is critically ill with multiple organ system failure which requires frequent high complexity decision making, assessment, support, evaluation, and titration of therapies. This was completed through the application of advanced monitoring technologies and extensive interpretation of multiple databases. During this encounter critical care  time was devoted to patient care services described in this note for 50 minutes.  Julian Hy, DO 05/05/20 7:52 AM [facility]

## 2020-05-05 NOTE — Progress Notes (Addendum)
Interim Note  -Patient continues to decline due to multiorgan failure including ischemia in the right upper extremity.  Heparin drip held as patient bleeding from her fasciotomy sites.  -CK continues to trend downwards.  Spoke with CCM attending yesterday about empiric high-dose steroids if there is suspicion for autoimmune myositis.  As needed Ativan for rigidity.  ANAs, ANCA, ENA panel, anti-Smith antibody, aldolase negative

## 2020-05-05 NOTE — Progress Notes (Signed)
[facility] Progress Note   Assessment/ Plan:   1. AoCKD 3- b/l creat 1.3 in March 2021, eGFR 46. Found down prior to admission and presented w/ AKI , oliguric w/ CPK >50,000 indicating significant rhabdomyolysis. Renal US w/o hydro. On CRRT, started 04/27/2020 in setting of postop K of 7.3, resp distress, intubation.  No heparin.   All 2K fluids, net neg 50 mL/ hr for now if pt can tolerate.  No signs of renal recovery.  2. Hypertonicity/ rigidity:  Resolved with Ativan, also gave  Ca gluc after.  MAR reviewed- no obvious culprit meds (no antimuscarinics, phenergan, SSRIs/ SNRIs), clinical picture not consistent with NMS/ serotonin syndrome.  Ativan given with resolution of symptoms.  Ca low but corrects with albumin.  Received many citrated blood products, has rhabdo--> got more Ca gluconate.  Think nonconvulsive status less likely.  Neuro has seen, appreciate input.  Autoimmune workup in progress, on empiric steroids with solumedrol 500 mg daily for possible myositis.    3. 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  Appreciate VVS. Has R radial thrombus, hep gtt attempted, bleeding from fasciotomy sites, defer to primary if/ when restarting is appropriate.  Autoimmune/ hypercoag workup in progress.  4. Multiple arterial thromboses: #2 and 3 probably related to this--> underlying process not clear yet.  Labs being sent as follows: ANA, ANCA, anti-Smith, SSA/B, aldolase, complements, Factor V Leiden, ATIII Protein C/S, LAC, APLA panel  5. Coagulopathy: no schistocytes on smear, not consistent with TTP, fibrinogen a little high if DIC at play too  6. Acute blood loss anemia: received multiple blood products this admission  7. Hyperkalemia - will correct with re-initiation of  CRRT  8. Shock: hemorrhagic +/- septic--> on pressor per PCCM, ID consulted, initially on vanc/ clinda/ cefepime, de-escalated to vanc/ cefepime.--> Vanc/ Zosyn  9. Acute hypoxic RF: intubated, per PCCM  10. Metabolic acidosis: renal failure + lactate  11. Afib with RVR: resolved s/p amiodarone  12. Dispo: in ICU--> we are giving her maximal medical therapy for all potential issues including possible autoimmune disease (primary rx steroids) and esoteric causes of multiple thromboses.  She is critically ill and it is not likely she will survive such a catastrophic illness.    Subjective:    Afib with RVR yesterday, pressures tanked, CRRT had to be stopped for about 12 hours, bleeding from fasciotomy sites after hep gtt for RUE arterial thrombus started  Got more pRBCs.  Remains on neo and vaso.  CRRT just getting restarted this AM.  Neuro saw yesterday, appreciate input.     Objective:   BP (!) 139/52   Pulse 66   Temp 99.5 F (37.5 C) (Axillary)   Resp (!) 24   Ht 5' 2"  (1.575 m)   Wt 87.7 kg   SpO2 100%   BMI 35.36 kg/m   Physical Exam: Gen: intubated, NAD CVS: RRR Resp: mech bilaterally Abd: soft--> winces when I press on belly, seems moreso in the upper quadrants  Ext: s/p R AKA, dressed, s/p L fascitomies wrapped.  + hypertonicity of bilateral upper extremities with increased flexion of L foot.  Cool and mottled bilateral hands. ACCESS: R IJ nontunneled HD cath  Labs: BMET Recent Labs  Lab 05/02/20 1540 05/02/20 1540 05/03/20 0437 05/03/20 0437 05/03/20 1922 05/04/20 0148 05/04/20 0939 05/04/20 1209 05/04/20  1422 05/04/20 1737 05/05/20 0404  NA 137   < > 138   < > 135 138 139 137 138 139 136  K 4.1   < > 4.7   < > 5.3* 4.7 4.8 5.3* 5.1 5.5* 5.5*  CL 104   < > 105  --  103 107  --  103 105 104 103  CO2 21*   < > 22  --  16* 17*  --  21* 19* 15* 18*  GLUCOSE 111*   < > 134*  --  283* 105*  --  118* 111* 86 124*  BUN 24*   < > 20  --  22 22  --  21 24* 20 33*   CREATININE 1.69*   < > 1.43*  --  1.71* 1.61*  --  1.35* 1.41* 1.43* 1.99*  CALCIUM 7.3*   < > 7.8*  --  7.4* 7.9*  --  8.0* 8.0* 7.7* 7.7*  PHOS 4.0  --  3.3  --  5.7* 4.0  --   --  4.3 6.5* 4.9*   < > = values in this interval not displayed.   CBC Recent Labs  Lab 05/08/2020 1139 04/17/2020 1816 05/04/20 0923 05/04/20 0923 05/04/20 0939 05/04/20 1737 05/04/20 2201 05/04/20 2316 05/05/20 0404  WBC 28.1*   < > 51.0*  --   --  50.6* 37.3*  --  41.8*  NEUTROABS 26.1*  --   --   --   --   --   --   --   --   HGB 15.1*   < > 10.0*   < > 10.5* 7.6* 5.6*  --  10.5*  HCT 48.6*   < > 30.9*   < > 31.0* 24.6* 17.9*  --  32.0*  MCV 88.0   < > 90.9  --   --  96.9 95.7  --  88.6  PLT 352   < > 115*   < >  --  94* 75* 70* 53*   < > = values in this interval not displayed.      Medications:    . artificial tears   Right Eye Q8H  . chlorhexidine gluconate (MEDLINE KIT)  15 mL Mouth Rinse BID  . Chlorhexidine Gluconate Cloth  6 each Topical Daily  . feeding supplement (PRO-STAT SUGAR FREE 64)  30 mL Per Tube BID  . insulin aspart  0-15 Units Subcutaneous Q4H  . mouth rinse  15 mL Mouth Rinse 10 times per day  . pantoprazole (PROTONIX) IV  40 mg Intravenous Q12H  . rOPINIRole  2 mg Per Tube Daily  . sodium chloride flush  10-40 mL Intracatheter Q12H     Madelon Lips MD 05/05/2020, 9:25 AM

## 2020-05-05 NOTE — Progress Notes (Signed)
   I was called to bedside for drop in hemoglobin and hematocrit with saturation of dressings.  Upon arrival bedside nurse had placed tied dressing with Kerlix and ABDs and Ace wrap and bleeding was well controlled with this.  We will continue to monitor.  If she continues to have bleeding will need to have consideration of INR reversal.  Huberta Tompkins C. Donzetta Matters, MD [facility] Office: [phone] Pager: [phone]

## 2020-05-06 DIAGNOSIS — Z7189 Other specified counseling: Secondary | ICD-10-CM

## 2020-05-06 DIAGNOSIS — N1832 Chronic kidney disease, stage 3b: Secondary | ICD-10-CM

## 2020-05-06 LAB — PREPARE FRESH FROZEN PLASMA
Unit division: 0
Unit division: 0
Unit division: 0
Unit division: 0

## 2020-05-06 LAB — BPAM FFP
Blood Product Expiration Date: 202106292359
Blood Product Expiration Date: 202106292359
Blood Product Expiration Date: 202106292359
Blood Product Expiration Date: 202106302359
Blood Product Expiration Date: 202106302359
ISSUE DATE / TIME: 202106250340
ISSUE DATE / TIME: 202106250340
ISSUE DATE / TIME: 202106250340
ISSUE DATE / TIME: 202106251426
ISSUE DATE / TIME: 202106251621
Unit Type and Rh: 600
Unit Type and Rh: 6200
Unit Type and Rh: 6200
Unit Type and Rh: 600
Unit Type and Rh: 6200

## 2020-05-06 LAB — RENAL FUNCTION PANEL
Albumin: 2.4 g/dL — ABNORMAL LOW (ref 3.5–5.0)
Anion gap: 14 (ref 5–15)
BUN: 23 mg/dL (ref 8–23)
CO2: 23 mmol/L (ref 22–32)
Calcium: 4.5 mg/dL — CL (ref 8.9–10.3)
Chloride: 100 mmol/L (ref 98–111)
Creatinine, Ser: 1.13 mg/dL — ABNORMAL HIGH (ref 0.44–1.00)
GFR calc Af Amer: 59 mL/min — ABNORMAL LOW (ref >60)
GFR calc non Af Amer: 51 mL/min — ABNORMAL LOW (ref >60)
Glucose, Bld: 130 mg/dL — ABNORMAL HIGH (ref 70–99)
Phosphorus: 3.8 mg/dL (ref 2.5–4.6)
Potassium: 5.5 mmol/L — ABNORMAL HIGH (ref 3.5–5.1)
Sodium: 137 mmol/L (ref 135–145)

## 2020-05-06 LAB — PREPARE PLATELET PHERESIS: Unit division: 0

## 2020-05-06 LAB — CBC
HCT: 27.6 % — ABNORMAL LOW (ref 36.0–46.0)
Hemoglobin: 9.4 g/dL — ABNORMAL LOW (ref 12.0–15.0)
MCH: 28.6 pg (ref 26.0–34.0)
MCHC: 34.1 g/dL (ref 30.0–36.0)
MCV: 83.9 fL (ref 80.0–100.0)
Platelets: 51 10*3/uL — ABNORMAL LOW (ref 150–400)
RBC: 3.29 MIL/uL — ABNORMAL LOW (ref 3.87–5.11)
RDW: 17.8 % — ABNORMAL HIGH (ref 11.5–15.5)
WBC: 41.1 10*3/uL — ABNORMAL HIGH (ref 4.0–10.5)
nRBC: 7.8 % — ABNORMAL HIGH (ref 0.0–0.2)

## 2020-05-06 LAB — GLUCOSE, CAPILLARY
Glucose-Capillary: 109 mg/dL — ABNORMAL HIGH (ref 70–99)
Glucose-Capillary: 128 mg/dL — ABNORMAL HIGH (ref 70–99)
Glucose-Capillary: 148 mg/dL — ABNORMAL HIGH (ref 70–99)

## 2020-05-06 LAB — BPAM PLATELET PHERESIS
Blood Product Expiration Date: 202106272359
ISSUE DATE / TIME: 202106251806
Unit Type and Rh: 6200

## 2020-05-06 LAB — PROTEIN C, TOTAL: Protein C, Total: 29 % — ABNORMAL LOW (ref 60–150)

## 2020-05-06 LAB — C4 COMPLEMENT: Complement C4, Body Fluid: 8 mg/dL — ABNORMAL LOW (ref 12–38)

## 2020-05-06 LAB — C3 COMPLEMENT: C3 Complement: 33 mg/dL — ABNORMAL LOW (ref 82–167)

## 2020-05-06 LAB — MAGNESIUM: Magnesium: 2.1 mg/dL (ref 1.7–2.4)

## 2020-05-06 MED ORDER — FENTANYL 2500MCG IN NS 250ML (10MCG/ML) PREMIX INFUSION
0.0000 ug/h | INTRAVENOUS | Status: DC
  Administered 2020-05-06: 50 ug/h via INTRAVENOUS
  Filled 2020-05-06: qty 250

## 2020-05-06 MED ORDER — GLYCOPYRROLATE 0.2 MG/ML IJ SOLN: 0.2000 mg | INTRAMUSCULAR | Status: DC | PRN

## 2020-05-06 MED ORDER — FENTANYL CITRATE (PF) 100 MCG/2ML IJ SOLN: 50.0000 ug | INTRAMUSCULAR | Status: DC | PRN

## 2020-05-06 MED ORDER — DIPHENHYDRAMINE HCL 50 MG/ML IJ SOLN: 25.0000 mg | INTRAMUSCULAR | Status: DC | PRN

## 2020-05-06 MED ORDER — CALCIUM GLUCONATE-NACL 2-0.675 GM/100ML-% IV SOLN
2.0000 g | Freq: Once | INTRAVENOUS | Status: AC
  Administered 2020-05-06: 2000 mg via INTRAVENOUS
  Filled 2020-05-06: qty 100

## 2020-05-06 MED ORDER — GLYCOPYRROLATE 1 MG PO TABS: 1.0000 mg | ORAL_TABLET | ORAL | Status: DC | PRN

## 2020-05-06 MED ORDER — POLYVINYL ALCOHOL 1.4 % OP SOLN
1.0000 [drp] | Freq: Four times a day (QID) | OPHTHALMIC | Status: DC | PRN
  Filled 2020-05-06: qty 15

## 2020-05-06 MED ORDER — FENTANYL BOLUS VIA INFUSION
100.0000 ug | INTRAVENOUS | Status: DC | PRN
  Administered 2020-05-06: 100 ug via INTRAVENOUS
  Filled 2020-05-06: qty 100

## 2020-05-06 MED ORDER — MIDAZOLAM HCL 2 MG/2ML IJ SOLN
2.0000 mg | INTRAMUSCULAR | Status: DC | PRN
  Administered 2020-05-06: 2 mg via INTRAVENOUS
  Filled 2020-05-06: qty 2

## 2020-05-07 LAB — CBC
HCT: 32 % — ABNORMAL LOW (ref 36.0–46.0)
Hemoglobin: 10.5 g/dL — ABNORMAL LOW (ref 12.0–15.0)
MCH: 29.1 pg (ref 26.0–34.0)
MCHC: 32.8 g/dL (ref 30.0–36.0)
MCV: 88.6 fL (ref 80.0–100.0)
Platelets: 53 10*3/uL — ABNORMAL LOW (ref 150–400)
RBC: 3.61 MIL/uL — ABNORMAL LOW (ref 3.87–5.11)
RDW: 17 % — ABNORMAL HIGH (ref 11.5–15.5)
WBC: 41.8 10*3/uL — ABNORMAL HIGH (ref 4.0–10.5)
nRBC: 14.2 % — ABNORMAL HIGH (ref 0.0–0.2)

## 2020-05-07 LAB — VITAMIN B1: Vitamin B1 (Thiamine): 345 nmol/L — ABNORMAL HIGH (ref 66.5–200.0)

## 2020-05-08 LAB — ANTIPHOSPHOLIPID SYNDROME EVAL, BLD
Anticardiolipin IgA: <9 APL U/mL (ref 0–11)
Anticardiolipin IgG: <9 GPL U/mL (ref 0–14)
Anticardiolipin IgM: <9 MPL U/mL (ref 0–12)
DRVVT: 47.5 s — ABNORMAL HIGH (ref 0.0–47.0)
PTT Lupus Anticoagulant: 43.4 s (ref 0.0–51.9)
Phosphatydalserine, IgA: 2 APS IgA (ref 0–20)
Phosphatydalserine, IgG: 1 GPS IgG (ref 0–11)
Phosphatydalserine, IgM: 4 MPS IgM (ref 0–25)

## 2020-05-08 LAB — FACTOR 5 LEIDEN

## 2020-05-08 LAB — SURGICAL PATHOLOGY

## 2020-05-08 LAB — DRVVT MIX: dRVVT Mix: 35.3 s (ref 0.0–40.4)

## 2020-05-08 LAB — GLUCOSE, CAPILLARY: Glucose-Capillary: 35 mg/dL — CL (ref 70–99)

## 2020-05-11 LAB — PROTEIN S, TOTAL: Protein S Ag, Total: 28 % — ABNORMAL LOW (ref 60–150)

## 2020-05-11 LAB — PROTEIN C ACTIVITY: Protein C Activity: 38 % — ABNORMAL LOW (ref 73–180)

## 2020-05-11 LAB — PROTEIN S ACTIVITY: Protein S Activity: 14 % — ABNORMAL LOW (ref 63–140)

## 2020-05-11 NOTE — Procedures (Signed)
Extubation Procedure Note  Patient Details:   Name: [name] DOB: Jul 15, 1956 MRN: [id number]   Airway Documentation:    Vent end date: May 27, 2020 Vent end time: 0950   Evaluation  O2 sats: currently acceptable Complications: No apparent complications Patient did tolerate procedure well. Bilateral Breath Sounds: Clear   No   Patient extubated per family request.  Patient placed on 2L nasal cannula.  Patient tolerated well.  No complications noted.   Judith Part 05-27-2020, 9:55 AM

## 2020-05-11 NOTE — Death Summary Note (Signed)
Physician Discharge Summary  Patient ID: [name] MRN: [id number] DOB/AGE: September 25, 1956 64 y.o.  Admit date: 05/02/2020 Discharge date: 05-14-20  Admission Diagnoses: Fall Rhabdomyolysis Anuric AKI on CKD 3b Hyperkalemia Weakness HTN  Discharge Diagnoses:  Principal Problem:   Blisters of multiple sites Active Problems:   Rhabdomyolysis   Respiratory failure ([place])   Acute renal failure superimposed on stage 3 chronic kidney disease (HCC)   AMS (altered mental status)   Hemorrhagic shock (HCC)   Compartment syndrome of right lower extremity ([place]) Acute hypoxic respiratory failure requiring MV Shock, presumed septic Acute metabolic encephalopathy Rigidity A-fib with RVR hypercalemia hypocalcemia Acute blood loss anemia Acute thrombocytopenia coagulopathy Radial artery occlusion Lactic acidosis Hyperglycemia Compartment syndrome LLE Transaminase elevation  Discharged Condition: deceased  Hospital Course:  [name] was volume resuscitated for her rhabdo after admission but developed pulseless RLE, requiring emergency fasciotomy. Following surgery she developed severe respiratory failure requiring reintubation, severe metabolic acidosis, and worsening AGMA and hyperkalemia. CRRT was started emergently. She had worsening lactic acidosis the following day with progressive hypotension and loss of pulses of in her RLE and worsening foot cyanosis and increasing compartment edema on the LLE, prompting R AKA and LLE fasciotomy. She temporarily improved, but subsequently developed worsening encephalopathy and developed muscle rigidity without obvious cause. Rigidity was treated with benzos, but recurred. Neuro evaluation failed to reveal a cause. EEG showed nonspecific generalized slowing, MRI brain was unchanged. She developed a R radial artery occlusion, which was treated with heparin until she developed hemorrhagic shock due to bleeding from her fasciotomy sites.  Heparin  was discontinued and topical thrombin with compressive dressings were used to control bleeding.  She subsequently developed distributive shock of unknown etiology with progressive decline and new onset A. fib with RVR treated with amiodarone.  Due to ongoing physiologic deterioration her family transitioned her to comfort focused care.  She was terminally extubated with family bedside on 6/26 and expired at 10:18.    Consults:  PCCM Vascular surgery Neurology Infectious disease Nephrology  Significant Diagnostic Studies: MRI brain 6/18:  FINDINGS: IMPRESSION: Unremarkable appearance of the brain for age.  CT abdomen & pelvis 6/24: IMPRESSION: 1. No evidence of bowel ischemia. Diffusely fluid-filled large and small bowel, ileus pattern. 2. Possible solid lesion arising from the lower right kidney measuring 16 mm. This appears isodense to adjacent renal parenchyma and may simply represent lobulations, but is indeterminate. Recommend renal protocol MRI on an elective basis for further evaluation on an elective basis after resolution of acute event. 3. Nonobstructing right nephrolithiasis. 4. High-density gallbladder contents not seen on recent ultrasound, indeterminate.  CT RLE 05/04/20: IMPRESSION: 1. 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 2. No evidence of right thyroid compartment syndrome.  CT LLE 05/04/20: IMPRESSION: 1. Asymmetric soft tissue density involving the muscle compartment in the calf suspicious for compartment syndrome, anterior compartment has mild loss of fatty intramuscular striations. Patient has had decompressive fasciotomy. 2. No evidence of thigh compartment syndrome. Generalized subcutaneous and perifascial edema.   EEG 6/23: IMPRESSION: This study is suggestive of severe diffuse encephalopathy, nonspecific etiology. No seizures or definite epileptiform discharges were seen throughout the recording.  MRI brain  6/23: IMPRESSION: Normal aging brain.  Echocardiogram 6/22: 1. Left ventricular ejection fraction, by estimation, is 65 to 70%. The  left ventricle has normal function. The left ventricle has no regional  wall motion abnormalities. There is mild concentric left ventricular  hypertrophy. Left ventricular diastolic  parameters are indeterminate.  2. Right ventricular systolic function is normal. The right ventricular  size is normal. There is normal pulmonary artery systolic pressure. The  estimated right ventricular systolic pressure is 27.6 mmHg.  3. The mitral valve is normal in structure. No evidence of mitral valve  regurgitation. No evidence of mitral stenosis.  4. The aortic valve is tricuspid. Aortic valve regurgitation is not  visualized. Mild aortic valve sclerosis is present, with no evidence of  aortic valve stenosis.  5. The inferior vena cava is normal in size with greater than 50%  respiratory variability, suggesting right atrial pressure of 3 mmHg.   Treatments:  Mechanical ventilation Antibiotics Vasopressors Antiarrhythmias Anticoagulation Blood transfusions Surgeries: Fasciotomy b/l LE, R AKA Pain management Palliative care   Disposition: funeral home of her family's choosing    Signed: Julian Hy 05/25/2020, 10:37 AM

## 2020-05-11 NOTE — Progress Notes (Signed)
   VASCULAR SURGERY ASSESSMENT & PLAN:   RIGHT AKA: The dressing on the right AKA is dry.  Continue daily dressing changes.  LEFT LOWER EXTREMITY FASCIOTOMIES: The dressing on her fasciotomy sites is currently dry.  This was changed by the nurses at the change of shift and there was serous drainage but no significant bloody drainage.  Continue dressing changes.  Apparently the family will be this considering comfort measures today.   SUBJECTIVE:   Sedated on vent  PHYSICAL EXAM:   Vitals:   05/12/20 0407 05/12/20 0500 2020-05-12 0600 May 12, 2020 0700  BP:  104/61 (!) 107/58 (!) 97/59  Pulse:  69 68 70  Resp:  (!) 27 (!) 27 (!) 26  Temp:    (!) 96.4 F (35.8 C)  TempSrc:      SpO2:  100% 100% 100%  Weight: 86.7 kg     Height:       Right AKA dressing is dry. The dressings on her left leg fasciotomy sites are dry.  LABS:   Lab Results  Component Value Date   WBC 41.1 (H) 2020/05/12   HGB 9.4 (L) 2020/05/12   HCT 27.6 (L) 2020-05-12   MCV 83.9 2020/05/12   PLT 51 (L) 05-12-2020   Lab Results  Component Value Date   CREATININE 1.13 (H) 12-May-2020   Lab Results  Component Value Date   INR 1.7 (H) 05/05/2020   CBG (last 3)  Recent Labs    2020/05/12 0020 05-12-20 0330 2020-05-12 0715  GLUCAP 148* 128* 109*    PROBLEM LIST:    Principal Problem:   Blisters of multiple sites Active Problems:   Rhabdomyolysis   Respiratory failure (HCC)   Acute renal failure superimposed on stage 3 chronic kidney disease (HCC)   AMS (altered mental status)   Hemorrhagic shock (HCC)   Compartment syndrome of right lower extremity ([place])   CURRENT MEDS:   . artificial tears   Right Eye Q8H  . chlorhexidine gluconate (MEDLINE KIT)  15 mL Mouth Rinse BID  . Chlorhexidine Gluconate Cloth  6 each Topical Daily  . feeding supplement (PRO-STAT SUGAR FREE 64)  30 mL Per Tube BID  . insulin aspart  0-15 Units Subcutaneous Q4H  . mouth rinse  15 mL Mouth Rinse 10 times per day  .  pantoprazole (PROTONIX) IV  40 mg Intravenous Q12H  . rOPINIRole  2 mg Per Tube Daily  . sodium chloride flush  10-40 mL Intracatheter Q12H  . Thrombi-Pad  3 each Topical Once    Deitra Mayo Office: [phone] 12-May-2020

## 2020-05-11 NOTE — Progress Notes (Signed)
NAME:  [name], MRN:  [id number], DOB:  Sep 27, 1956, LOS: 8 ADMISSION DATE:  05/03/2020, CONSULTATION DATE:  2020/05/20 REFERRING MD:  Anesthesia, CHIEF COMPLAINT:  Respiratory failure   Brief History   64 y.o. F who fell on 6/18 and developed Rhabdo and worsening RLE pain, taken to the OR on 6/20 for thromboembolectomy and fasciotomy, was extubated and then had to be re-intubated and found to be profoundly anemic.  PCCM consulted for transfer to ICU.   History of present illness   64 y.o. F with PMH HTN, CKD and RLS who was initially admitted 6/18 after rolling out bed and being down on the floor for several hours. She was treated for rhabdo with IVF and admitted, later developed mottling of the RLE and taken to the OR 6/20 for thromboembolectomy and compartment fasciotomies. Post-op was extubated and awake, then developed worsening respiratory status and was re-intubated. Labs returned with Hgb 4.2, K 7.3, pH 7.0 and pt requiring Neo gtt.    Per report, EBL 1L intraoperatively.  Patient was given 2 A bicarb, calcium, insulin, 4 units PRBC ordered and transferred to the intensive care unit for the initiation of CRRT.  Past Medical History   has a past medical history of Balance disorder, Hypertension, and Renal disorder.  Significant Hospital Events   6/18 Admit to internal medicine 6/20 > RLE thromboembolectomy and fasciotomy, transfer to ICU 6/21>RLE AKA, LLE fasciotomy x 4   Consults:  Vascular surgery PCCM Nephrology Neurology ID  Procedures:  ETT 6/20 6/20 > RLE thromboembolectomy and fasciotomy, transfer to ICU 6/21>RLE AKA, LLE fasciotomy x 4   Significant Diagnostic Tests:  6/18 CT head>> no acute findings 6/18 MRI brain>>unremarkable 6/23 RUQ US> no gallstones or biliary dilatation. Possible hepatic steatosis  6/23 MRI brain>> normal for age 49/23 EEG>> diffuse encephalopathy, no evidence of seizure  6/24 CT a/p> no evidence of bowel ischemia. Fluid filled large  and small bowel without transition point, ileus pattern. No distention or wall thickening, no inflammatory change.   Possible solid lesion from lower R kidney 10mm, indeterminate.  6/24 CT BLE> no evidence of compartment syndrome   Micro Data:  6/18 blood cultures x2>> 6/18 SARS-Covid-2>> negative  Antimicrobials:  Ancef 6/20 Cefepime 6/21> vanc 6/21> clinda 6/21>6/22  Interim history/subjective:  No additional bleeding.  No improvement in mental status overnight.  Family at bedside planning for palliative extubation today.  Objective   Blood pressure (!) 117/58, pulse 73, temperature (!) 97.2 F (36.2 C), temperature source Axillary, resp. rate (!) 23, height 5\' 2"  (1.575 m), weight 86.7 kg, SpO2 100 %.    Vent Mode: PSV;CPAP FiO2 (%):  [30 %-40 %] 30 % Set Rate:  [26 bmp] 26 bmp Vt Set:  [400 mL] 400 mL PEEP:  [5 cmH20-10 cmH20] 5 cmH20 Pressure Support:  [10 cmH20-12 cmH20] 12 cmH20 Plateau Pressure:  [17 cmH20] 17 cmH20   Intake/Output Summary (Last 24 hours) at 2020/05/20 0851 Last data filed at 20-May-2020 0800 Gross per 24 hour  Intake 3177.59 ml  Output 4199 ml  Net -1021.41 ml   Filed Weights   05/04/20 0500 05/05/20 0448 20-May-2020 0407  Weight: 82.5 kg 87.7 kg 86.7 kg   General: Critically ill-appearing woman lying in bed intubated, not sedated Neuro: No response to verbal stimulation, grimacing on abdominal exam.  Persistent rigidity with resistance to elbow flexion extension, hand flexion extension HEENT: [place]/AT, right pupil chronically nonreactive, left pupil dilated and minimally reactive. CV: Regular rate and rhythm,  no murmurs PULM: Auscultation bilaterally, breathing comfortably on SBT 12/5  GI: Distended, grimaces to palpation, no guarding Extremities: Edema, right AKA without bleeding, left leg bandaged without visible blood on dressings.  No right radial pulse. Skin: Leaking blisters on legs.  Mottled distal extremities.  Resolved Hospital Problem list    Compartment syndrome BLE Acute limb ischemia BLE Hyperphosphatemia  Assessment & Plan:   Acute metabolic encephalopathy, worsening.  Likely metabolic in setting of worsening acidosis, sepsis unknown source, renal failure, but muscle rigidity raises question for CNS cause, although EEG and brain MRI have been normal.  She has been on empiric antibiotics, diminishing the likelihood for an acute CNS infection. -Transitioning to comfort care.  Continue steroids. -Fentanyl infusion -Versed for rigidity  Increased muscle tone BUE, some interval improvement . Hx RLS previously on requip, changed to gabapentin outpt.  Symptoms improve transiently with benzodiazepines.  Unlikely viral infectious cause of myositis.  Given her chronic outpatient neurologic symptoms, there is concern for an underlying chronic neuromuscular disease process. --Transition to comfort care -Appreciate neurology's input  Acute hypoxic respiratory failure requiring mechanical ventilation due to severe acidosis, encephalopathy requiring MV. Reintubated after respiratory decompemsation in PACU.  -Terminal extubation today -On steroids to prevent airway edema -Glycopyrrolate for secretions Fentanyl and Versed for air hunger  Shock-likely multifactorial.  Acutely hypovolemic shock likely contributing.  Underlying distributive shock is most likely, although the etiology remains elusive.  Being treated empirically for adrenal insufficiency and sepsis of unknown source.  No suspicion for anaphylaxis.  Bedside echo on 6/24 with normal LV function, normal RV size and function. -Discontinue antibiotics and vasopressors  A. fib with RVR, now back in sinus rhythm.  Poorly tolerant of change in rhythm causing significant hypotension. -Fentanyl and Versed for discomfort  Lactic acidosis-despite negative CT scan, concern for intra-abdominal process. Possibly colitis with diarrhea, distention, +lactoferrin, and heme+ stool. C. Diff  negative. -Discontinue antibiotics -Discontinue lab monitoring   ABLA, improved Hemorrhagic shock, improved -in setting of LLE fasciotomies, RLE thrombectomy, RLE AKA  Received 9 units pRBCs this admission  -trend CBC -Holding heparin -Continue compressive dressing to left lower extremity -Transfuse for hemoglobin less than 7 or hemodynamically significant bleeding.  Compartment syndrome BLE due to rhabdo, s/p AKA on R after unsuccessful fasciotomy on R, fasciotomy on L -Bandage to left lower extremity  Acute on chronic renal failure  Hyperkalemia Rhabdomyolysis; concern that this is posttraumatic and due to an underlying neurologic condition is the degree of rhabdo seems disproportionate to a relatively short time of immobility. Hypocalcemia-due to rhabdo versus metabolic abnormalities -Discontinue dialysis  Transaminitis, improving, due to shock liver and muscle enzyme leak from rhabdo -No additional lab monitoring  Coagulopathy Thrombocytopenia, mild although interval worsening  Both likely due to critical illness.  No hypofibrinogenemia (although dropping) or schistocytes to suggest DIC. -No additional lab monitoring or transfusions  Skin bullae, likely due to compartment syndrom -Wound care to keep sites covered  Hyperglycemia with intermittent hypoglycemia -Discontinue Accu-Cheks and insulin  Right radial artery occlusion-likely sequela of previous brachial A-line versus hypercoagulable state -Heparin on hold due to bleeding; will not be restarted   Multiple family members at bedside, including [facility] daughter.  We will plan to proceed with terminal extubation this morning.  Liberalized visitation.   Best practice:  Diet: EN, decreasing rate in setting of possible ileus  Pain/Anxiety/Delirium protocol (if indicated): Fentanyl/propofol VAP protocol (if indicated): HOB 30 degrees, suction as needed DVT prophylaxis: none GI prophylaxis: Protonix Glucose control:  d/c Mobility: Bedrest Code Status: DNR Family Communication: Daughter updated at side Disposition: ICU  Labs   CBC: Recent Labs  Lab 05/05/20 0404 05/05/20 1037 05/05/20 1544 05/05/20 2246 05/25/2020 0320  WBC 41.8* 39.0* 39.6* 41.4* 41.1*  HGB 10.5* 10.4* 10.6* 9.3* 9.4*  HCT 32.0* 31.4* 31.9* 27.7* 27.6*  MCV 88.6 86.5 86.2 85.2 83.9  PLT 53* 42* 41* 56* 51*    Basic Metabolic Panel: Recent Labs  Lab 05/02/20 0513 05/02/20 1048 05/03/20 0437 05/03/20 1922 05/04/20 0146 05/04/20 0148 05/04/20 1422 05/04/20 1422 05/04/20 1737 05/05/20 0404 05/05/20 1037 05/05/20 1544 05-25-20 0320  NA 135   < > 138   < >  --    < > 138   < > 139 136 137 136 137  K 3.7   < > 4.7   < >  --    < > 5.1   < > 5.5* 5.5* 4.7 4.2 5.5*  CL 100   < > 105   < >  --    < > 105   < > 104 103 102 103 100  CO2 22   < > 22   < >  --    < > 19*   < > 15* 18* 21* 18* 23  GLUCOSE 251*   < > 134*   < >  --    < > 111*   < > 86 124* 133* 136* 130*  BUN 31*   < > 20   < >  --    < > 24*   < > 20 33* 31* 28* 23  CREATININE 2.21*   < > 1.43*   < >  --    < > 1.41*   < > 1.43* 1.99* 1.66* 1.48* 1.13*  CALCIUM 6.7*   < > 7.8*   < >  --    < > 8.0*   < > 7.7* 7.7* 7.7* 7.7* 4.5*  MG 2.3  --  2.5*  --  2.5*  --   --   --   --  2.6*  --   --  2.1  PHOS 5.4*   < > 3.3   < >  --    < > 4.3  --  6.5* 4.9*  --  4.5 3.8   < > = values in this interval not displayed.   GFR: Estimated Creatinine Clearance: 50.7 mL/min (A) (by C-G formula based on SCr of 1.13 mg/dL (H)). Recent Labs  Lab 05/04/20 1737 05/04/20 2036 05/04/20 2201 05/05/20 0404 05/05/20 0819 05/05/20 1037 05/05/20 1544 05/05/20 2246 25-May-2020 0320  WBC 50.6*  --    < >   < >  --  39.0* 39.6* 41.4* 41.1*  LATICACIDVEN >11.0* >11.0*  --   --  5.5* 4.9*  --   --   --    < > = values in this interval not displayed.    Liver Function Tests: Recent Labs  Lab 05/02/20 0513 05/02/20 1540 05/03/20 0437 05/03/20 1922 05/04/20 0146  05/04/20 0148 05/04/20 1209 05/04/20 1209 05/04/20 1422 05/04/20 1737 05/05/20 0404 05/05/20 1544 25-May-2020 0320  AST 7,064*  --  1,062*  --  1,856*  --  1,791*  --   --   --  1,119*  --   --   ALT 6,948*  --  2,109*  --  1,210*  --  1,088*  --   --   --  607*  --   --  ALKPHOS 110  --  159*  --  148*  --  168*  --   --   --  141*  --   --   BILITOT 2.8*  --  2.9*  --  3.1*  --  4.6*  --   --   --  4.2*  --   --   PROT 3.8*  --  4.6*  --  4.5*  --  4.3*  --   --   --  3.5*  --   --   ALBUMIN 2.5*  2.4*   < > 2.5*  2.5*   < > 3.2*   < > 2.9*   < > 2.9* 1.9* 2.2*  2.2* 2.5* 2.4*   < > = values in this interval not displayed.   Recent Labs  Lab 05/04/20 2036  LIPASE 67*   Recent Labs  Lab 05/04/20 0759  AMMONIA 31    ABG    Component Value Date/Time   PHART 7.231 (L) 05/04/2020 0000   PCO2ART 30.8 (L) 04/13/2020 0000   PO2ART 282 (H) 04/11/2020 0000   HCO3 23.5 05/04/2020 0939   TCO2 25 05/04/2020 0939   ACIDBASEDEF 3.0 (H) 05/04/2020 0939   O2SAT 54.0 05/04/2020 0939     Coagulation Profile: Recent Labs  Lab 05/04/20 0759 05/04/20 1744 05/04/20 2316 05/05/20 0404 05/05/20 1037  INR 2.1* 3.2* 2.7*  2.7* 2.2* 1.7*    Cardiac Enzymes: Recent Labs  Lab 04/26/2020 0531 05/02/20 0513 05/03/20 0437 05/03/20 2229 05/05/20 0404  CKTOTAL >50,000* >50,000* 23,880* 9,257* 7,938*    HbA1C: Hgb A1c MFr Bld  Date/Time Value Ref Range Status  04/29/2020 02:15 AM 6.3 (H) 4.8 - 5.6 % Final    Comment:    (NOTE) Pre diabetes:          5.7%-6.4%  Diabetes:              >6.4%  Glycemic control for   <7.0% adults with diabetes     CBG: Recent Labs  Lab 05/05/20 1542 05/05/20 1957 2020/06/02 0020 02-Jun-2020 0330 June 02, 2020 0715  GLUCAP 130* 114* 148* 128* 109*    This patient is critically ill with multiple organ system failure which requires frequent high complexity decision making, assessment, support, evaluation, and titration of therapies. This was  completed through the application of advanced monitoring technologies and extensive interpretation of multiple databases. During this encounter critical care time was devoted to patient care services described in this note for 35 minutes.  Julian Hy, DO 2020/06/02 8:51 AM [facility]

## 2020-05-11 NOTE — Progress Notes (Signed)
CRITICAL VALUE ALERT  Critical Value: calcium 4.5  Date & Time Notied: 05/18/2020 4:55  Provider Notified: Yes  Orders Received/Actions taken: verbal order received and followed. See MAR.

## 2020-05-11 NOTE — Progress Notes (Signed)
After discussion w/ CCM MD and family, plan for pt to transition to comfort cares today.  Family to notify RN when they are ready to withdraw care. Will keep CRRT, vent, and pressors on until that time.

## 2020-05-11 NOTE — Progress Notes (Signed)
Received call from patient placement who expressed concerns regarding whether pt should be an ME case.  Spoke w/ Aldona Bar, [place] who states patient is not an ME case.  Per Aldona Bar, she has spoken w/ patient placement as well to relay this.

## 2020-05-11 NOTE — Progress Notes (Signed)
RT note: patient placed on CPAP/PSV of 12/5 at 0725.  Currently tolerating well at this time.  Will continue to monitor.

## 2020-05-11 NOTE — Progress Notes (Signed)
Time of Death: 1018 AM.  Verified by 2 RNs, CCM MD notified.  Family at bedside.

## 2020-05-11 NOTE — Progress Notes (Signed)
Patient extubated at 0955, CRRT turned off at 1000, pressors turned off at 1000. Family currently at bedside.

## 2020-05-11 NOTE — Progress Notes (Signed)
Referral made to [facility] at 1030.

## 2020-05-11 DEATH — deceased

## 2020-05-12 LAB — LUPUS ANTICOAGULANT PANEL
DRVVT: 58.6 s — ABNORMAL HIGH (ref 0.0–47.0)
PTT Lupus Anticoagulant: 42.3 s (ref 0.0–51.9)

## 2020-05-12 LAB — DRVVT MIX: dRVVT Mix: 38 s (ref 0.0–40.4)

## 2020-05-19 ENCOUNTER — Encounter (HOSPITAL_COMMUNITY): Payer: Self-pay | Admitting: Vascular Surgery

## 2021-06-12 IMAGING — MR MR HEAD W/O CM
12 of 13 series · 44 of 48 positions shown · non-contrast
Comparison: 04/28/2020

CLINICAL DATA: Encephalopathy

EXAM:
MRI HEAD WITHOUT CONTRAST
TECHNIQUE: Multiplanar, multiecho pulse sequences of the brain and surrounding
structures were obtained without intravenous contrast.

[Series 5: DWI · axial · 3.0mm · 0.88mm/px · z∈[-45,+88]mm · 7 of 100 slices shown (1 of 4)]
[im 1/100]
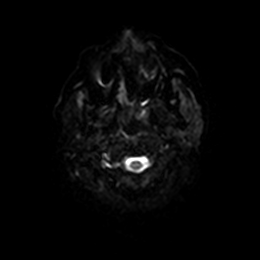
[im 17/100]
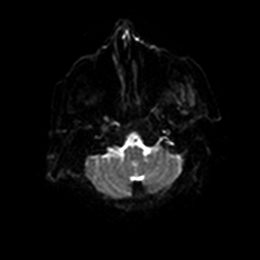
[im 34/100]
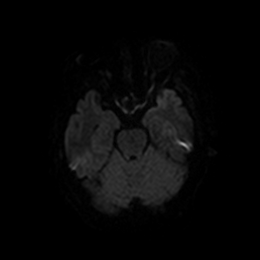
[im 50/100]
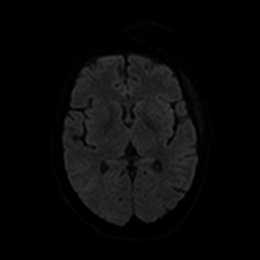
[im 67/100]
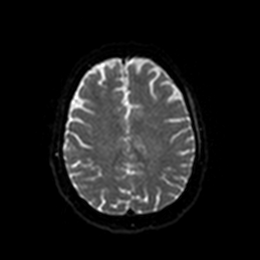
[im 83/100]
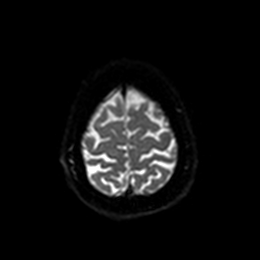
[im 100/100]
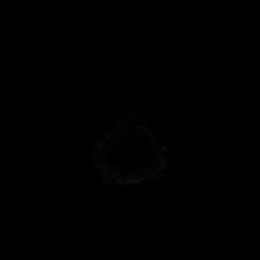

[Series 6: DWI · axial · 3.0mm · 0.88mm/px · z∈[-45,+88]mm · 4 of 50 slices shown (2 of 4)]
[im 1/50]
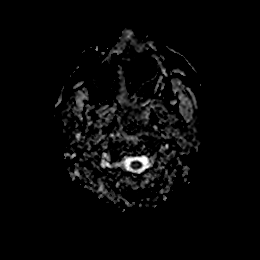
[im 17/50]
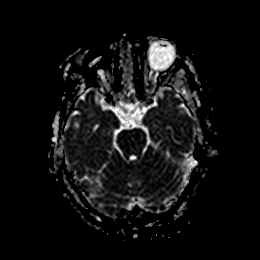
[im 33/50]
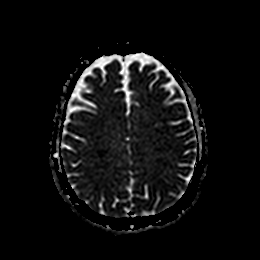
[im 50/50]
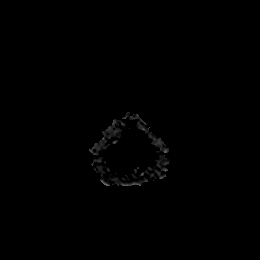

[Series 7: DWI · coronal · 4.0mm · 0.88mm/px · 6 of 68 slices shown (3 of 4)]
[im 1/68]
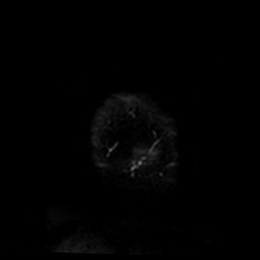
[im 14/68]
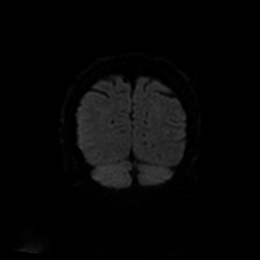
[im 27/68]
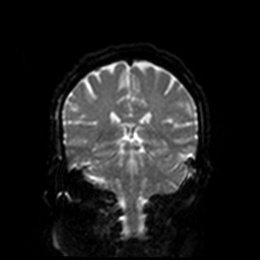
[im 41/68]
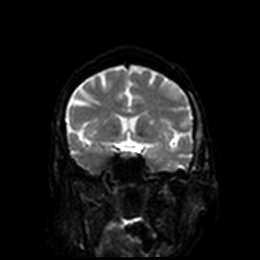
[im 54/68]
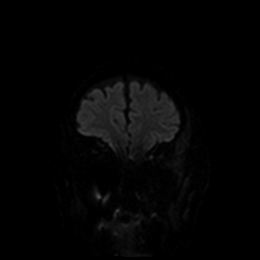
[im 68/68]
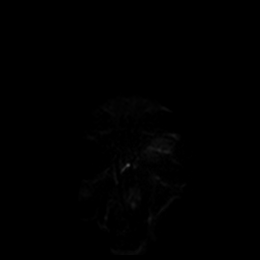

[Series 8: DWI · coronal · 4.0mm · 0.88mm/px · 3 of 34 slices shown (4 of 4)]
[im 1/34]
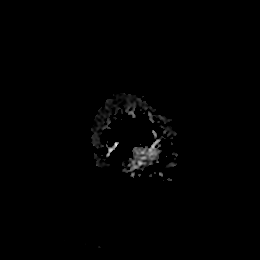
[im 17/34]
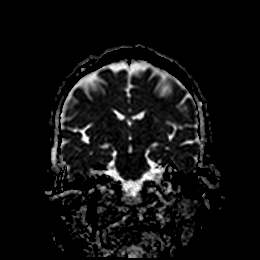
[im 34/34]
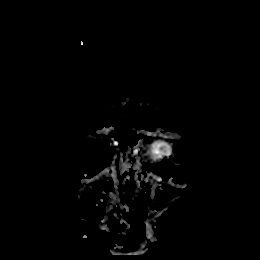

[Series 9: T1 · sagittal · 5.0mm · 0.75mm/px · 2 of 23 slices shown]
[im 1/23]
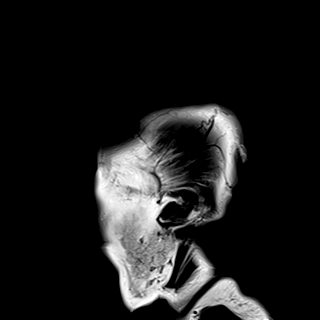
[im 23/23]
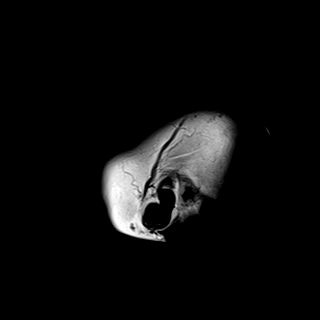

[Series 10: T2 · axial · 5.0mm · 0.90mm/px · z∈[-66,+69]mm · 2 of 25 slices shown (1 of 2)]
[im 1/25]
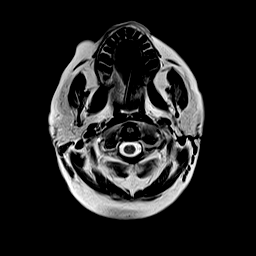
[im 25/25]
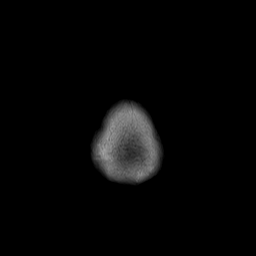

[Series 11: FLAIR · axial · 5.0mm · 0.45mm/px · z∈[-62,+74]mm · 2 of 25 slices shown]
[im 1/25]
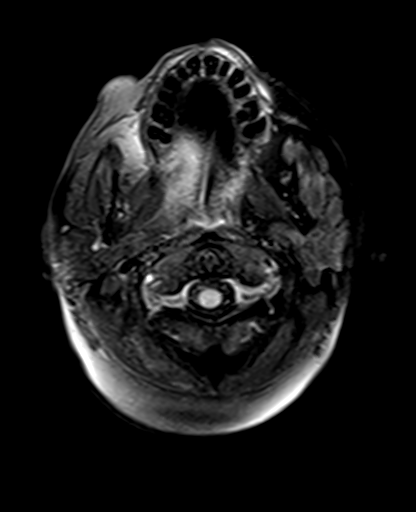
[im 25/25]
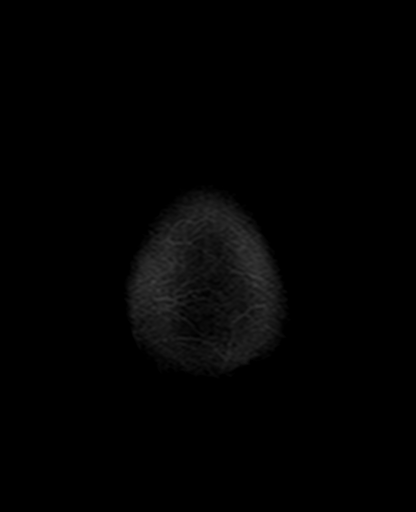

[Series 12: mag_images · axial · 3.0mm · 0.90mm/px · z∈[-54,+77]mm · 4 of 48 slices shown]
[im 1/48]
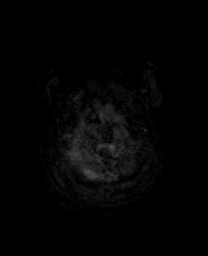
[im 16/48]
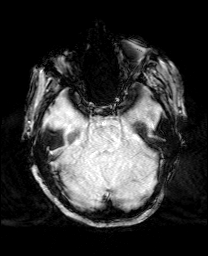
[im 32/48]
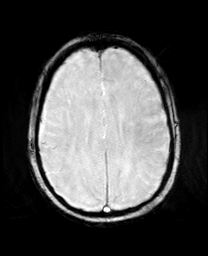
[im 48/48]
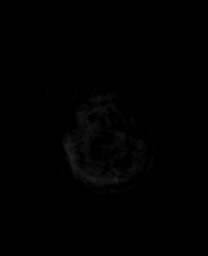

[Series 13: pha_images · axial · 3.0mm · 0.90mm/px · z∈[-54,+77]mm · 4 of 48 slices shown]
[im 1/48]
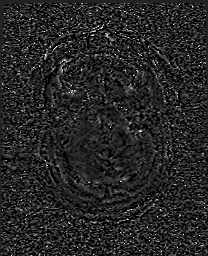
[im 16/48]
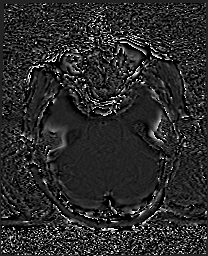
[im 32/48]
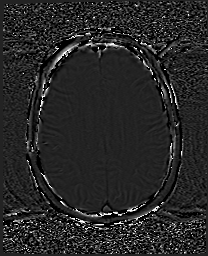
[im 48/48]
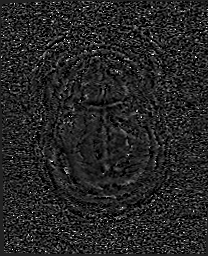

[Series 14: swi_images · axial · 3.0mm · 0.90mm/px · z∈[-54,+77]mm · 4 of 48 slices shown]
[im 1/48]
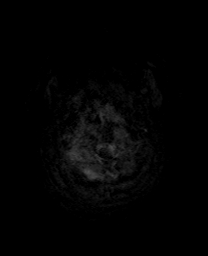
[im 16/48]
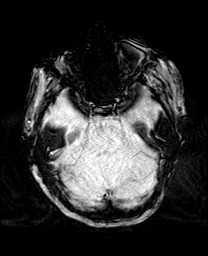
[im 32/48]
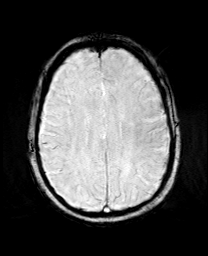
[im 48/48]
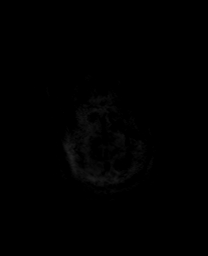

[Series 15: mip_images(sw) · axial · 24.0mm · 0.90mm/px · z∈[-44,+67]mm · 3 of 41 slices shown]
[im 1/41]
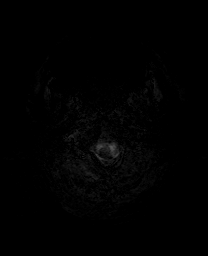
[im 21/41]
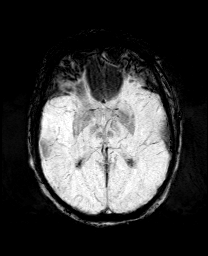
[im 41/41]
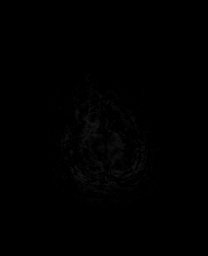

[Series 17: T2 · coronal · 4.0mm · 0.43mm/px · 3 of 33 slices shown (2 of 2)]
[im 1/33]
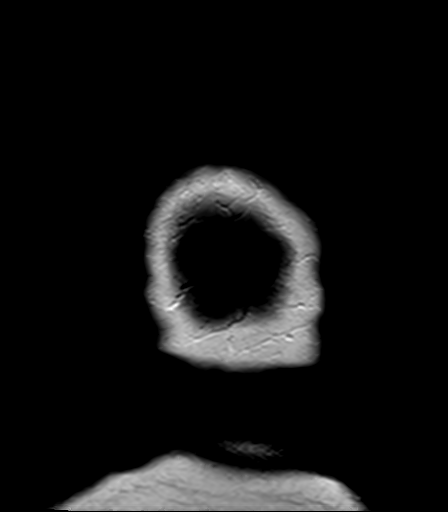
[im 17/33]
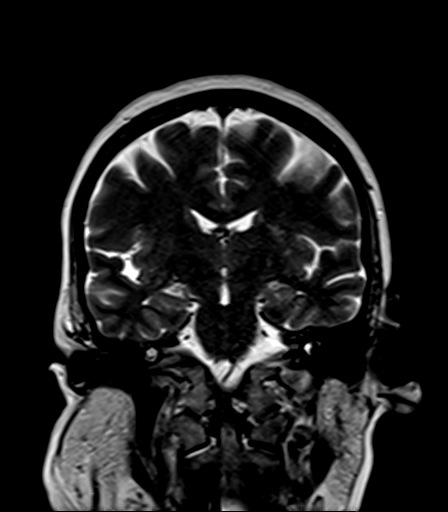
[im 33/33]
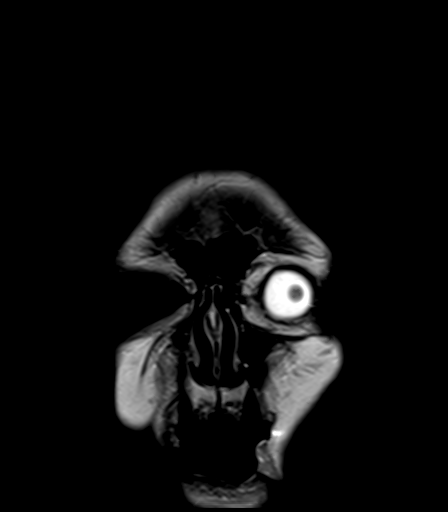

[44 of 48 positions shown; findings below may reference images not displayed]

FINDINGS: BRAIN: No acute infarct, acute hemorrhage or extra-axial collection.
Mild white matter hyperintensity, most commonly due to chronic
ischemic microangiopathy, though not unexpected for age. Normal
volume of CSF spaces. No chronic microhemorrhage. Normal midline
structures.

VASCULAR: Major flow voids are preserved.

SKULL AND UPPER CERVICAL SPINE: Normal calvarium and skull base.
Visualized upper cervical spine and soft tissues are normal.

SINUSES/ORBITS: No paranasal sinus fluid levels or advanced mucosal
thickening. No mastoid or middle ear effusion. Right ocular
prosthesis.
IMPRESSION: Normal aging brain.

## 2021-06-12 IMAGING — US US ABDOMEN LIMITED
1 series · 14 of 25 positions shown · non-contrast
Comparison: 04/29/2020, abdomen x-ray 05/03/2020, ultrasound
12/10/2012

CLINICAL DATA: Abdomen pain

EXAM:
ULTRASOUND ABDOMEN LIMITED RIGHT UPPER QUADRANT

[Series 1: us abdomen limited ruq · 14 of 28 slices shown]
[im 1/28]
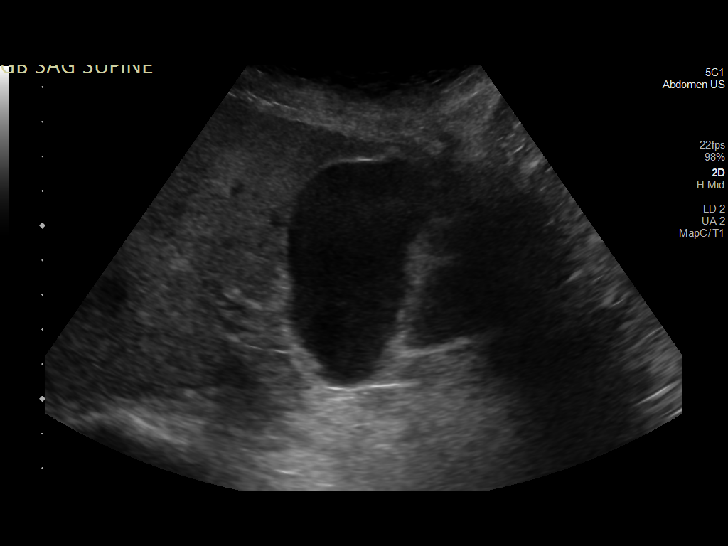
[im 3/28]
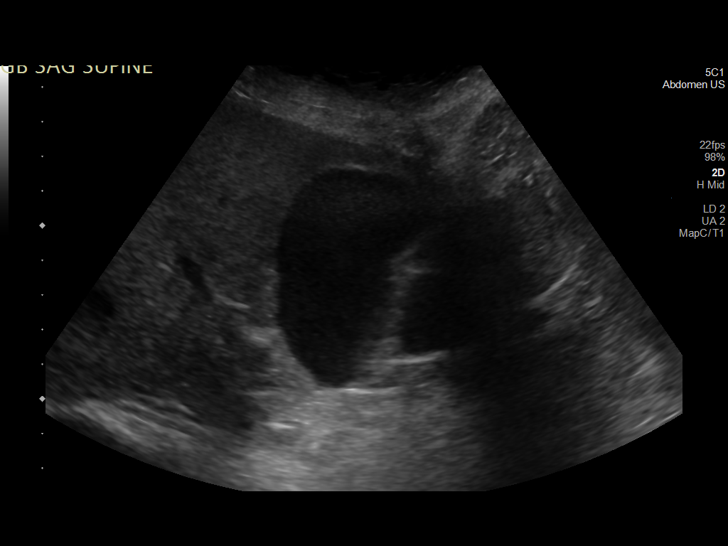
[im 5/28]
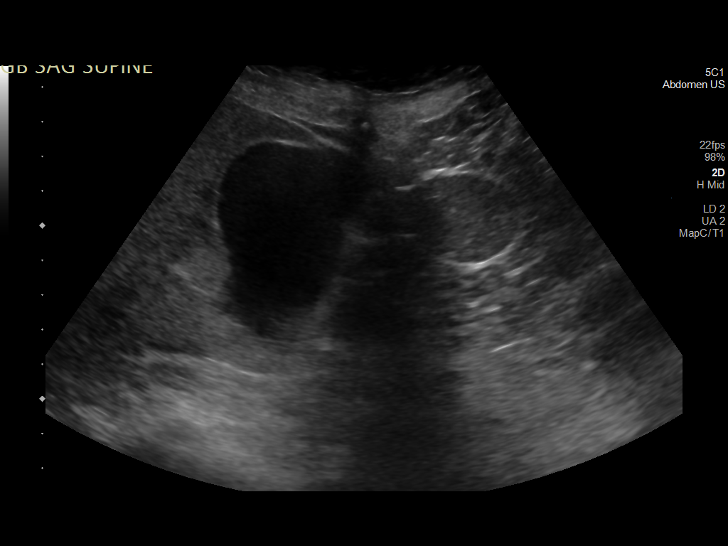
[im 7/28]
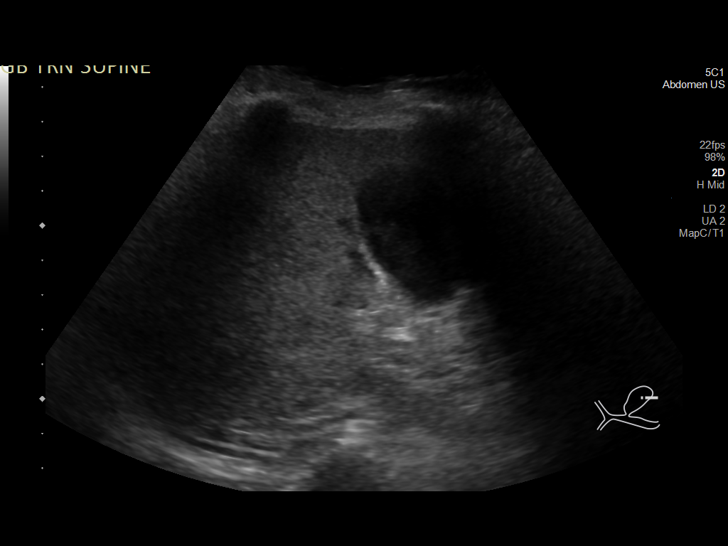
[im 10/28]
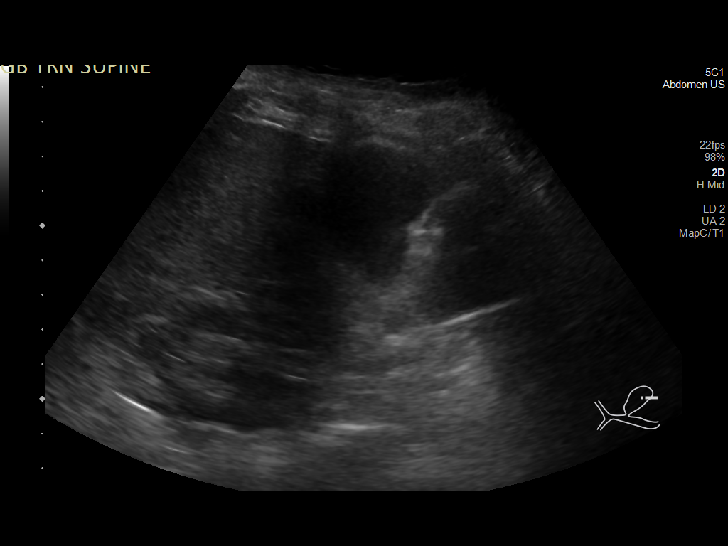
[im 11/28]
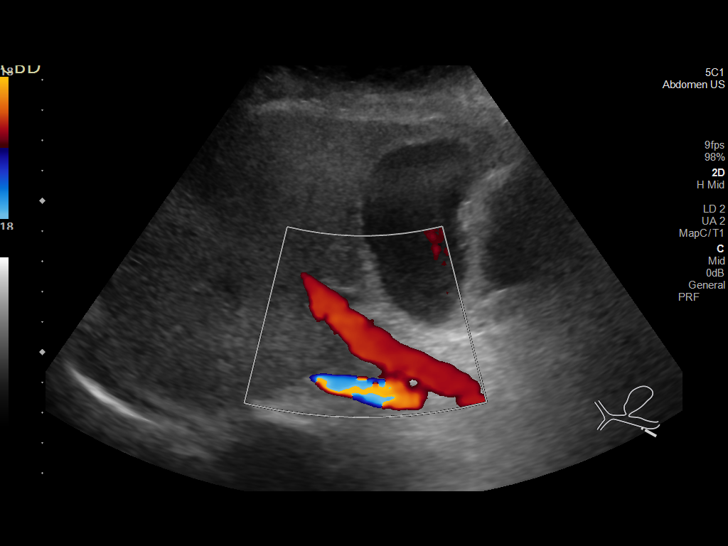
[im 13/28]
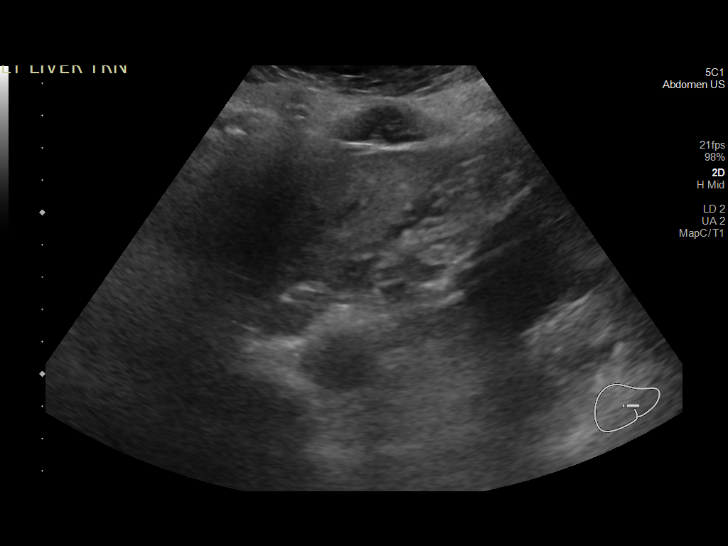
[im 15/28]
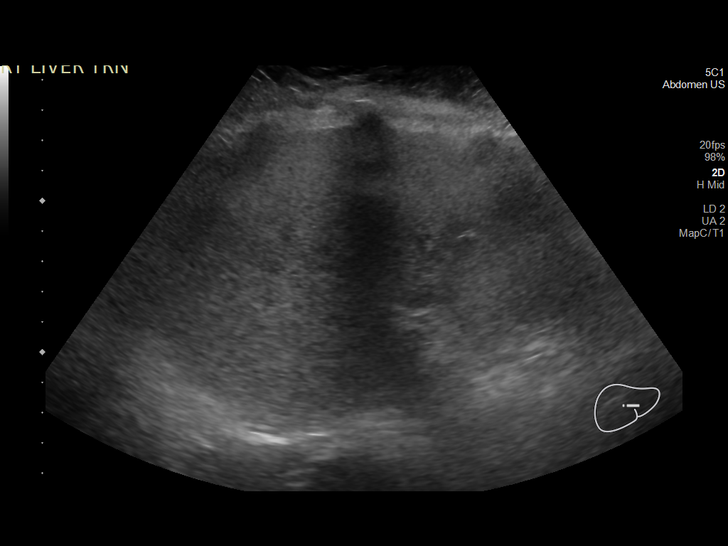
[im 17/28]
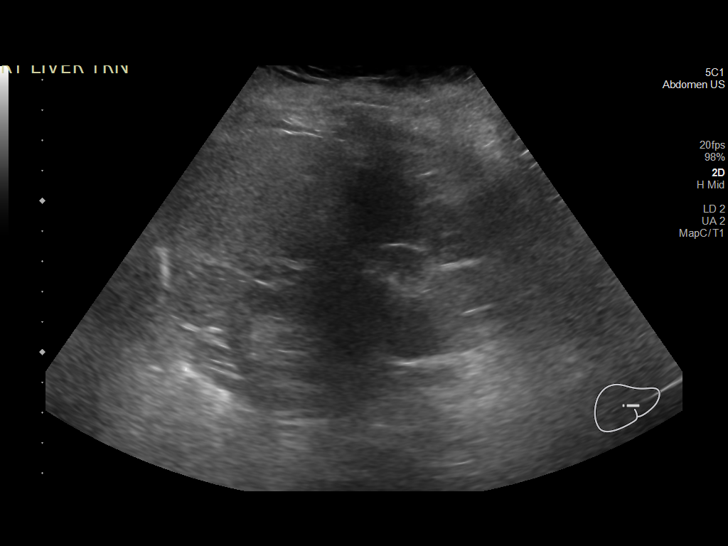
[im 19/28]
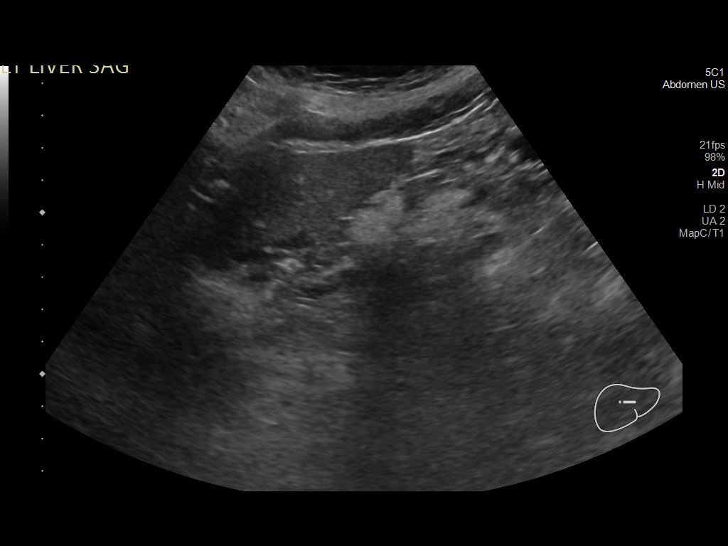
[im 21/28]
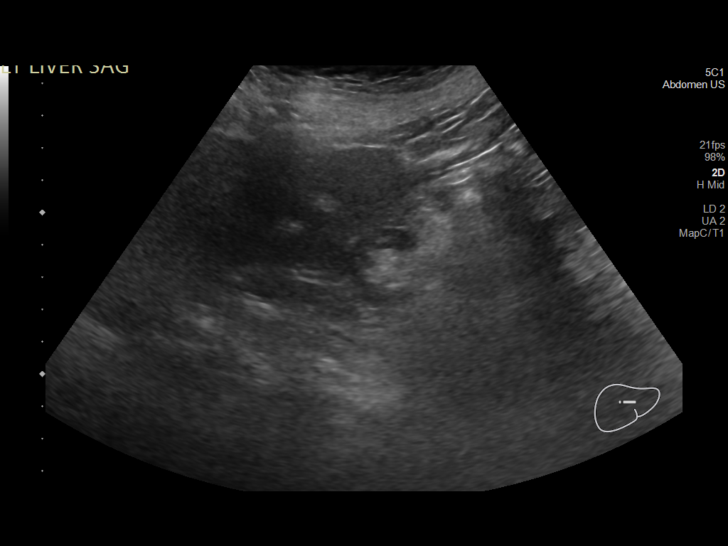
[im 23/28]
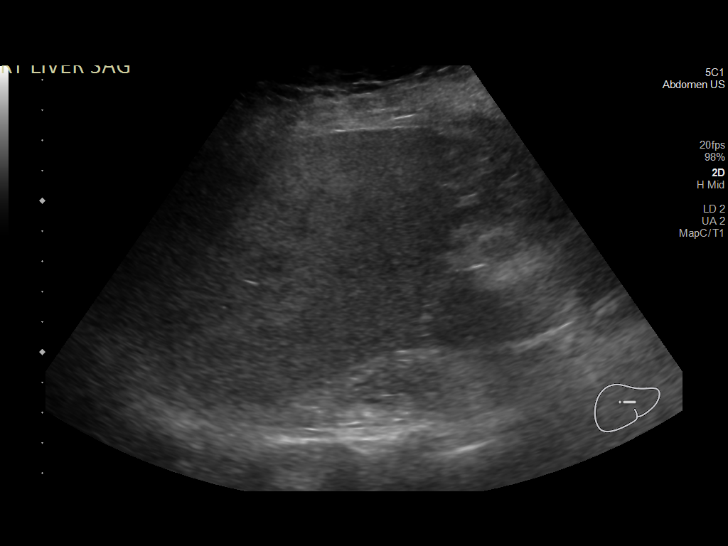
[im 25/28]
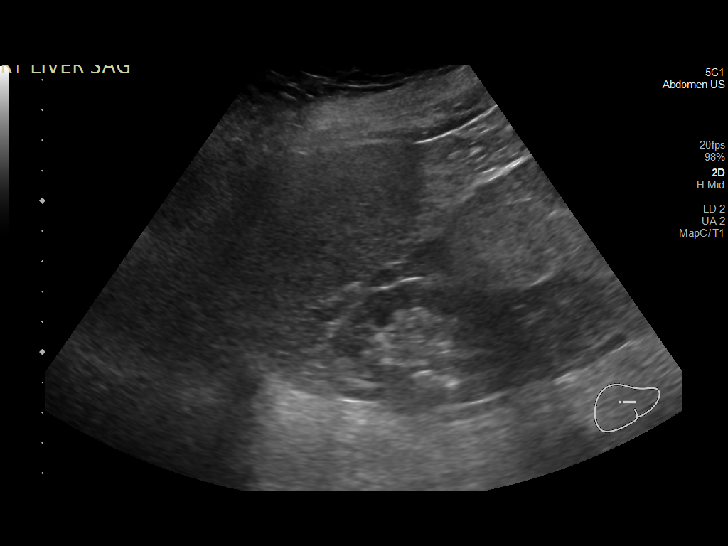
[im 28/28]
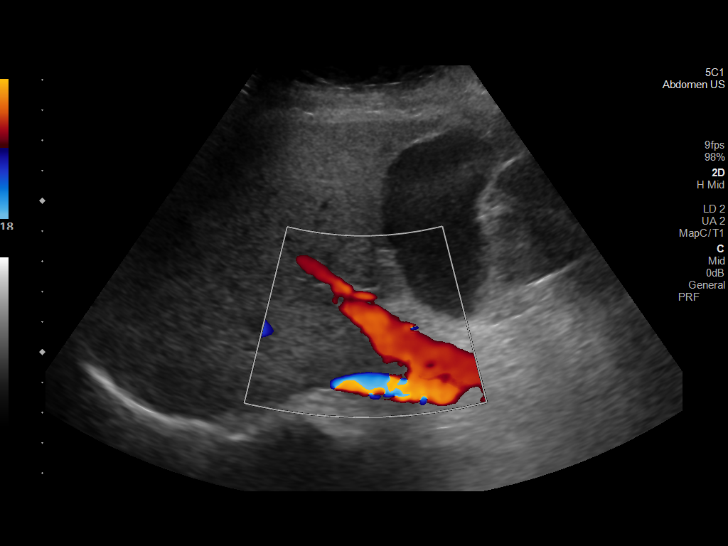

[14 of 25 positions shown; findings below may reference images not displayed]

FINDINGS: Gallbladder:

No gallstones or wall thickening visualized. No sonographic Murphy
sign noted by sonographer.

Common bile duct:

Diameter: 4 mm

Liver:

Liver is slightly echogenic. No focal hepatic abnormality. Portal
vein is patent on color Doppler imaging with normal direction of
blood flow towards the liver.

Other: None.
IMPRESSION: 1. Negative for gallstones or biliary dilatation
2. Slightly echogenic liver suggesting steatosis

## 2021-06-13 IMAGING — DX DG CHEST 1V PORT
1 series · 2 of 2 positions shown · non-contrast
Comparison: Radiograph 04/30/2020

CLINICAL DATA: Intubation

EXAM:
PORTABLE CHEST 1 VIEW

[Series 1: chest · 0.14mm/px · 2 of 2 slices shown]
[im 1/2]
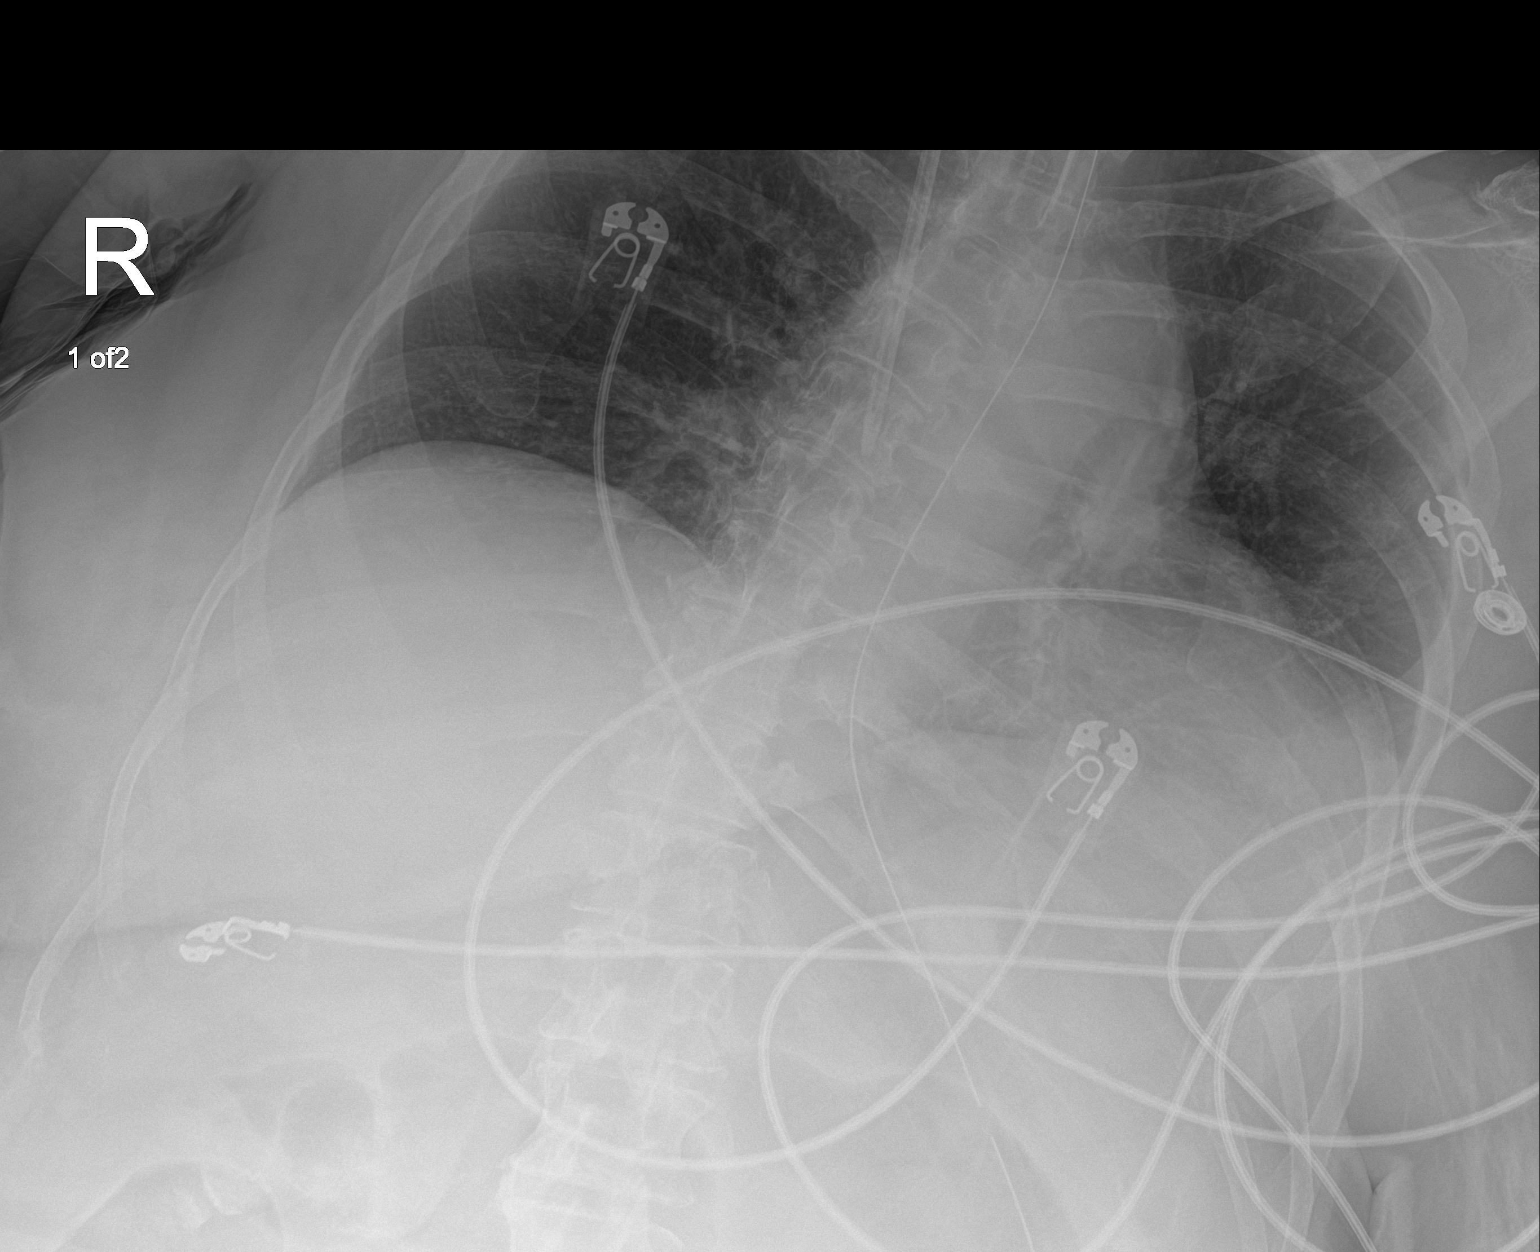
[im 2/2]
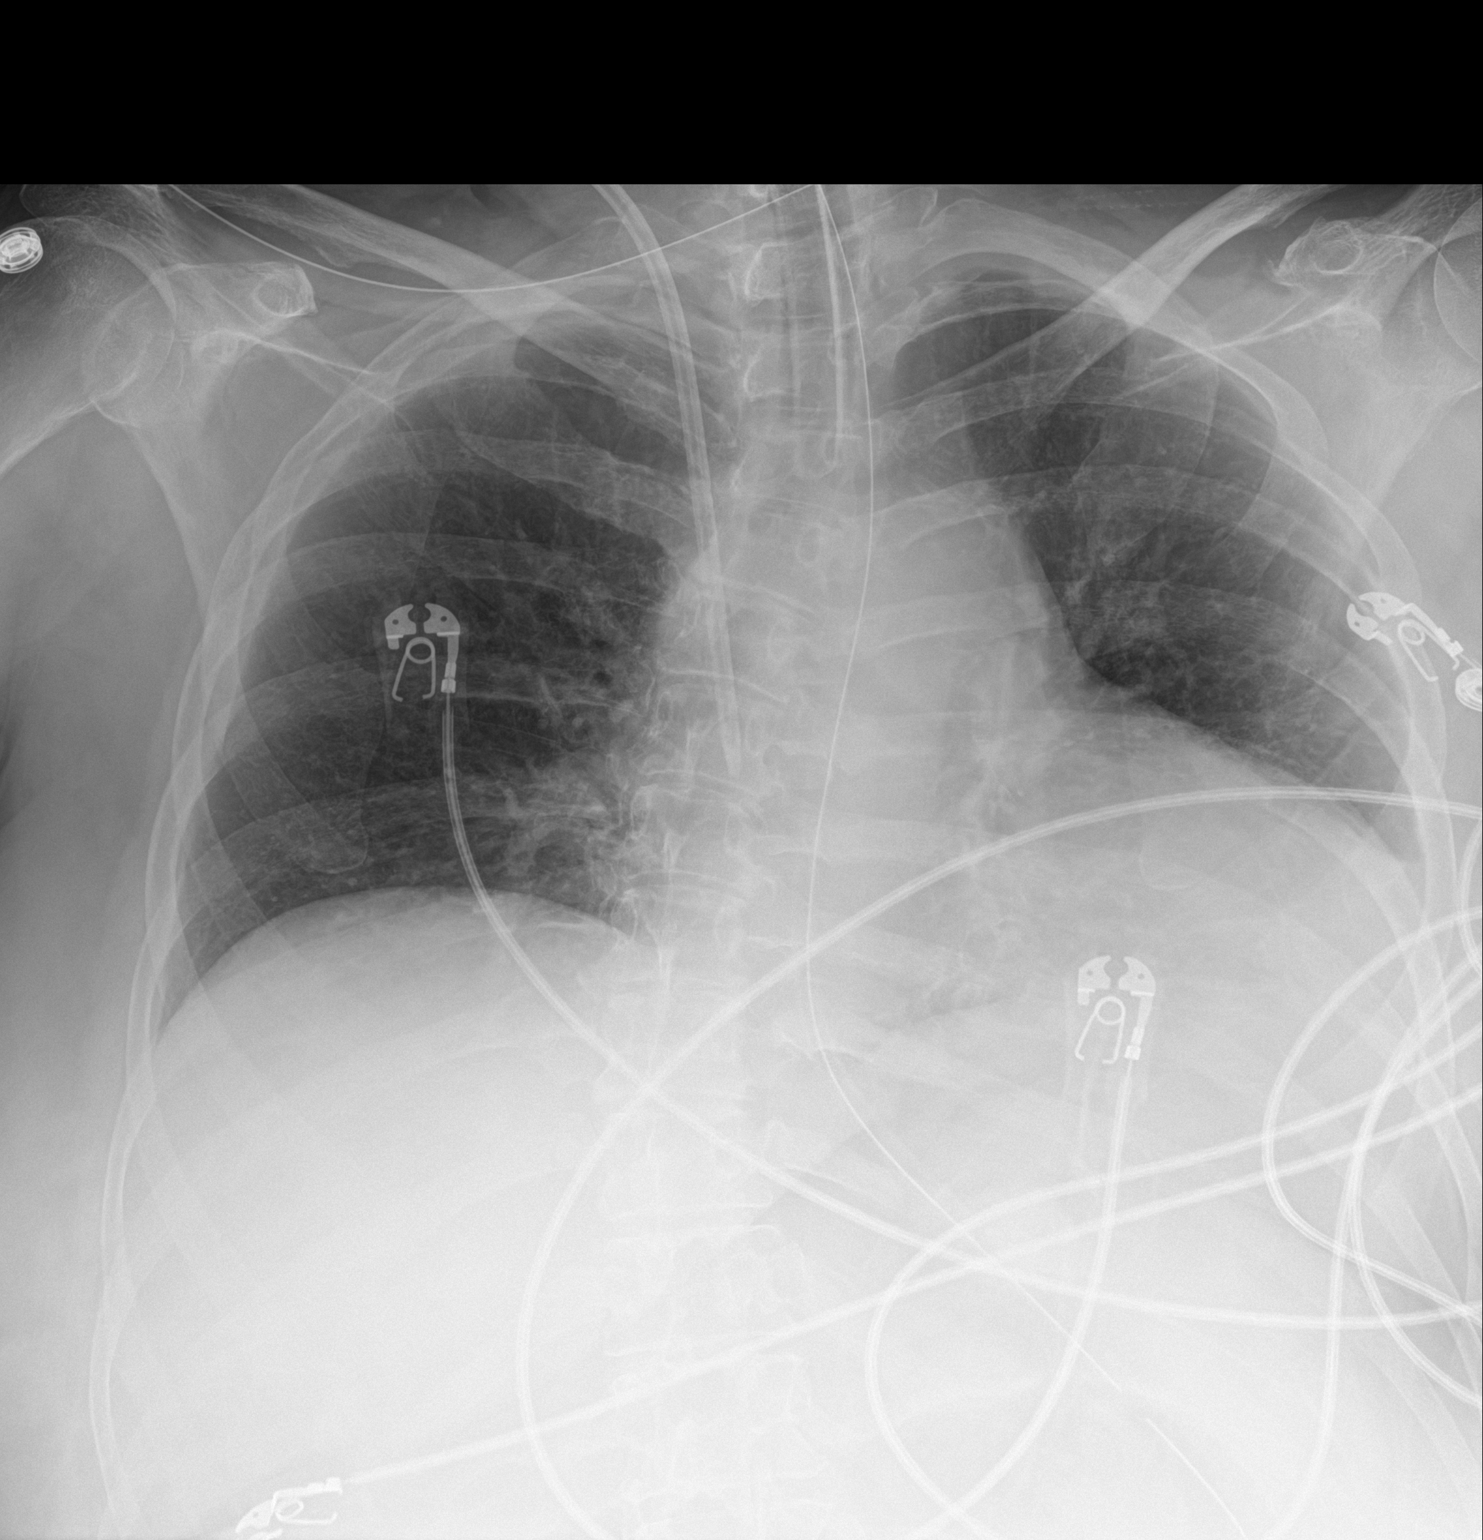

[2 of 2 positions shown; findings below may reference images not displayed]

FINDINGS: Endotracheal tube terminates in the mid trachea 3.6 cm from the
carina.

Transesophageal tube tip terminates below the level of imaging with
the side port distal to the GE junction.

Right IJ catheter sheath terminates at the level of the right
atrium.

Cardiac monitor leads overlie the chest.

Lung volumes are low with mixed streaky and patchy opacities in the
bases more confluent in the retrocardiac space. Obscuration of the
left basilar periphery could reflect some pleural effusion or
mediastinal fat. No right effusion or pneumothorax is visible.
Stable mild cardiomegaly is similar to prior. No acute osseous or
soft tissue abnormality. Stable dextrocurvature of the thoracic
spine.
IMPRESSION: 1. Lines and tubes as above.
2. Low lung volumes with mixed streaky and patchy opacities in the
bases more confluent in the retrocardiac space, could reflect
atelectasis, though superimposed airspace disease or edema may be
present.
